# Patient Record
Sex: Male | Born: 1960
Health system: Southern US, Community
[De-identification: ages and names within clinical notes are randomized; demographics above are authoritative.]

## PROBLEM LIST (undated history)

## (undated) DIAGNOSIS — R569 Unspecified convulsions: Secondary | ICD-10-CM

## (undated) DIAGNOSIS — E119 Type 2 diabetes mellitus without complications: Secondary | ICD-10-CM

## (undated) DIAGNOSIS — K2270 Barrett's esophagus without dysplasia: Secondary | ICD-10-CM

## (undated) DIAGNOSIS — F419 Anxiety disorder, unspecified: Secondary | ICD-10-CM

## (undated) DIAGNOSIS — I639 Cerebral infarction, unspecified: Secondary | ICD-10-CM

## (undated) DIAGNOSIS — F319 Bipolar disorder, unspecified: Secondary | ICD-10-CM

## (undated) DIAGNOSIS — F329 Major depressive disorder, single episode, unspecified: Secondary | ICD-10-CM

## (undated) DIAGNOSIS — Q279 Congenital malformation of peripheral vascular system, unspecified: Secondary | ICD-10-CM

## (undated) DIAGNOSIS — I498 Other specified cardiac arrhythmias: Secondary | ICD-10-CM

## (undated) DIAGNOSIS — K219 Gastro-esophageal reflux disease without esophagitis: Secondary | ICD-10-CM

## (undated) DIAGNOSIS — G4730 Sleep apnea, unspecified: Secondary | ICD-10-CM

## (undated) DIAGNOSIS — N189 Chronic kidney disease, unspecified: Secondary | ICD-10-CM

## (undated) DIAGNOSIS — I10 Essential (primary) hypertension: Secondary | ICD-10-CM

## (undated) DIAGNOSIS — F32A Depression, unspecified: Secondary | ICD-10-CM

## (undated) DIAGNOSIS — D1800 Hemangioma unspecified site: Secondary | ICD-10-CM

## (undated) DIAGNOSIS — Z8619 Personal history of other infectious and parasitic diseases: Secondary | ICD-10-CM

## (undated) HISTORY — DX: Essential (primary) hypertension: I10

## (undated) HISTORY — DX: Type 2 diabetes mellitus without complications: E11.9

## (undated) HISTORY — DX: Anxiety disorder, unspecified: F41.9

## (undated) HISTORY — DX: Other specified cardiac arrhythmias: I49.8

## (undated) HISTORY — DX: Bipolar disorder, unspecified: F31.9

## (undated) HISTORY — DX: Personal history of other infectious and parasitic diseases: Z86.19

## (undated) HISTORY — DX: Major depressive disorder, single episode, unspecified: F32.9

## (undated) HISTORY — DX: Gastro-esophageal reflux disease without esophagitis: K21.9

## (undated) HISTORY — PX: OTHER SURGICAL HISTORY: SHX169

## (undated) HISTORY — DX: Depression, unspecified: F32.A

## (undated) HISTORY — DX: Chronic kidney disease, unspecified: N18.9

## (undated) HISTORY — DX: Barrett's esophagus without dysplasia: K22.70

## (undated) HISTORY — DX: Sleep apnea, unspecified: G47.30

---
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
## 1999-10-15 ENCOUNTER — Encounter: Payer: Self-pay | Admitting: Emergency Medicine

## 1999-10-15 ENCOUNTER — Emergency Department (HOSPITAL_COMMUNITY): Admission: EM | Admit: 1999-10-15 | Discharge: 1999-10-15 | Payer: Self-pay | Admitting: Emergency Medicine

## 2003-10-27 ENCOUNTER — Inpatient Hospital Stay (HOSPITAL_COMMUNITY): Admission: EM | Admit: 2003-10-27 | Discharge: 2003-10-28 | Payer: Self-pay | Admitting: Emergency Medicine

## 2003-11-12 ENCOUNTER — Ambulatory Visit (HOSPITAL_COMMUNITY): Admission: RE | Admit: 2003-11-12 | Discharge: 2003-11-12 | Payer: Self-pay | Admitting: Neurological Surgery

## 2006-05-09 ENCOUNTER — Emergency Department (HOSPITAL_COMMUNITY): Admission: EM | Admit: 2006-05-09 | Discharge: 2006-05-09 | Payer: Self-pay | Admitting: Emergency Medicine

## 2008-02-15 ENCOUNTER — Emergency Department (HOSPITAL_COMMUNITY): Admission: EM | Admit: 2008-02-15 | Discharge: 2008-02-16 | Payer: Self-pay | Admitting: Emergency Medicine

## 2008-02-16 ENCOUNTER — Ambulatory Visit: Payer: Self-pay | Admitting: *Deleted

## 2008-02-16 ENCOUNTER — Inpatient Hospital Stay (HOSPITAL_COMMUNITY): Admission: EM | Admit: 2008-02-16 | Discharge: 2008-02-20 | Payer: Self-pay | Admitting: *Deleted

## 2008-02-21 ENCOUNTER — Encounter: Admission: RE | Admit: 2008-02-21 | Discharge: 2008-02-21 | Payer: Self-pay | Admitting: Internal Medicine

## 2010-04-19 LAB — CBC
HCT: 47.6 % (ref 39.0–52.0)
Hemoglobin: 16.1 g/dL (ref 13.0–17.0)
MCHC: 33.9 g/dL (ref 30.0–36.0)
MCV: 89.9 fL (ref 78.0–100.0)
Platelets: 167 10*3/uL (ref 150–400)
RBC: 5.29 MIL/uL (ref 4.22–5.81)
RDW: 12.8 % (ref 11.5–15.5)
WBC: 9.4 10*3/uL (ref 4.0–10.5)

## 2010-04-19 LAB — BASIC METABOLIC PANEL
BUN: 14 mg/dL (ref 6–23)
CO2: 28 mEq/L (ref 19–32)
Calcium: 9.5 mg/dL (ref 8.4–10.5)
Chloride: 103 mEq/L (ref 96–112)
Creatinine, Ser: 0.77 mg/dL (ref 0.4–1.5)
GFR calc Af Amer: >60 mL/min (ref >60)
GFR calc non Af Amer: >60 mL/min (ref >60)
Glucose, Bld: 102 mg/dL — ABNORMAL HIGH (ref 70–99)
Potassium: 3.5 mEq/L (ref 3.5–5.1)
Sodium: 140 mEq/L (ref 135–145)

## 2010-04-19 LAB — DIFFERENTIAL
Basophils Absolute: 0 10*3/uL (ref 0.0–0.1)
Basophils Relative: 0 % (ref 0–1)
Eosinophils Absolute: 0.3 10*3/uL (ref 0.0–0.7)
Eosinophils Relative: 3 % (ref 0–5)
Lymphocytes Relative: 19 % (ref 12–46)
Lymphs Abs: 1.8 10*3/uL (ref 0.7–4.0)
Monocytes Absolute: 0.6 10*3/uL (ref 0.1–1.0)
Monocytes Relative: 6 % (ref 3–12)
Neutro Abs: 6.6 10*3/uL (ref 1.7–7.7)
Neutrophils Relative %: 71 % (ref 43–77)

## 2010-04-19 LAB — CARBOXYHEMOGLOBIN
Carboxyhemoglobin: 1.2 % (ref 0.5–1.5)
Methemoglobin: 1.7 % — ABNORMAL HIGH (ref 0.0–1.5)
O2 Saturation: 95.5 %
Total hemoglobin: 15.8 g/dL (ref 13.5–18.0)
Total oxygen content: 20.6 mL/dL (ref 15.0–23.0)

## 2010-04-19 LAB — RAPID URINE DRUG SCREEN, HOSP PERFORMED
Amphetamines: NOT DETECTED
Barbiturates: NOT DETECTED
Benzodiazepines: POSITIVE — AB
Cocaine: NOT DETECTED
Opiates: NOT DETECTED
Tetrahydrocannabinol: NOT DETECTED

## 2010-04-19 LAB — ETHANOL: Alcohol, Ethyl (B): 10 mg/dL (ref 0–10)

## 2010-04-19 LAB — SALICYLATE LEVEL: Salicylate Lvl: <4 mg/dL (ref 2.8–20.0)

## 2010-04-19 LAB — ACETAMINOPHEN LEVEL: Acetaminophen (Tylenol), Serum: <10 ug/mL — ABNORMAL LOW (ref 10–30)
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
## 2010-05-20 NOTE — Consult Note (Signed)
Todd Harris, Todd Harris                  ACCOUNT NO.:  [id number]   MEDICAL RECORD NO.:  [id number]          PATIENT TYPE:  EMS   LOCATION:  MINO                         FACILITY:  [facility]   PHYSICIAN:  Madelynn Done, MD  DATE OF BIRTH:  January 01, 1961   DATE OF CONSULTATION:  05/09/2006  DATE OF DISCHARGE:  05/09/2006                                 CONSULTATION   REASON FOR CONSULTATION:  Right middle finger pain and swelling.   REQUESTING PHYSICIAN:  Dr. Radford Pax, Emergency Department.   HISTORY:  Todd Harris is a 50 year old right hand dominant gentleman who  on Saturday morning, May 05, 2006, was feeding a dog biscuit to his blind  dog and unfortunately sustained a puncture wound along the ulnar aspect  of his middle finger at the tip.  The patient provided some local wound  care but noticed some increased pain and swelling.  He went to the  urgent care facility yesterday.  He was given some Rocephin and placed  on p.o. antibiotics and returned today.  The urgent care sent him to the  emergency department for concern about the swelling and the increased  swelling in his finger.  The patient was seen and evaluated in the  emergency department, and I was consulted for the evaluation of his  middle finger.  He denies any systemic signs of illnesses.  No fevers,  chills, nausea, vomiting.  No constitutional symptoms.   PAST MEDICAL HISTORY:  Depression.   PAST SURGICAL HISTORY:  Tonsillectomy, appendectomy, and what sounds  like a lipoma excision from the left shoulder region.   MEDICATIONS:  Lexapro.   ALLERGIES:  NONE.   SOCIAL HISTORY:  Socially, he is a nonsmoker.  He works in [employer] investment [employer].  He lives in [place].  His last tetanus shot was given in  the urgent care within the last 5 days.   REVIEW OF SYSTEMS:  No recent illnesses, hospitalizations.   EXAMINATION:  GENERAL:  He is a healthy-appearing 50 year old white male  in no acute distress.  He is alert and  oriented to person, place, and  time.  He has normal hand coordination.  Normal mood.  VITAL SIGNS:  He has a temperature of 97.8 oral, blood pressure 136/93,  heart rate of 75, and respirations of 20.  EXTREMITIES:  On examination of the right upper extremity, he has no  ascending erythema or evidence of lymphangitis.  He does have some mild  swelling over the middle finger from the MP crease out distally.  There  is some mild swelling in the digit, but it does not appear to be a  sausage-appearing digit.  He does not hold the finger in a flexed  posture.  He does not have pain with gentle passive motion with flexion  and extension.  I am able to bring his finger all the way down to the  distal palmar crease passively.  He has a small puncture wound along the  dorsal ulnar aspect of the middle finger at the distal pulp region.  His  FDS and FDP  are in continuity with the digit.  His sensation to light  touch is present.  He has good cap refill and good skin turgor.  He has  otherwise a full composite grip, normal muscle tone, and good strength  in the hand.  He has minimal tenderness over the flexor tendon sheath,  slight discomfort between the A2 and A4 region but not in the palm or  the distal tip.   LABORATORY STUDIES:  He has a white count of 7.7.   IMPRESSION:  Right middle finger dog bite with swelling and cellulitis.   PLAN:  Today, I reviewed the treatment course with the patient.  I do  not think he has a purulent tenosynovitis.  We talked about  immobilization and continuing the p.o. antibiotics.  The patient has  agreed with this plan.  He was given my office telephone number and told  to follow up in the office in the morning.  He was also given my cell  phone number if he has any worsening problems, fevers, chills, worsening  pain in that finger.  I plan to see him back in the morning to recheck  the finger.  He was instructed to be NPO.  If it does worsen, he may  need  decompression of the tendon sheath, if he does have more swelling,  more pain, and worsening redness.  The patient voiced understanding of  the plan.  The patient's questions were answered tonight.  He will  continue on the doxycycline and the clindamycin since they have already  been prescribed.  The patient's questions were answered today, and we  will see him back in followup.      Madelynn Done, MD  Electronically Signed     FWO/MEDQ  D:  05/09/2006  T:  05/09/2006  Job:  [id number]
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
## 2011-06-27 ENCOUNTER — Encounter: Payer: Self-pay | Admitting: Gastroenterology

## 2011-08-14 ENCOUNTER — Ambulatory Visit (AMBULATORY_SURGERY_CENTER): Payer: 59

## 2011-08-14 VITALS — Ht 70.0 in | Wt 176.0 lb

## 2011-08-14 DIAGNOSIS — Z1211 Encounter for screening for malignant neoplasm of colon: Secondary | ICD-10-CM

## 2011-08-14 MED ORDER — MOVIPREP 100 G PO SOLR: 1.0000 | Freq: Once | ORAL | Status: DC

## 2011-08-15 ENCOUNTER — Encounter: Payer: Self-pay | Admitting: Internal Medicine

## 2011-08-18 ENCOUNTER — Encounter: Payer: Self-pay | Admitting: Internal Medicine

## 2011-08-18 ENCOUNTER — Ambulatory Visit (AMBULATORY_SURGERY_CENTER): Payer: 59 | Admitting: Internal Medicine

## 2011-08-18 VITALS — BP 136/97 | HR 68 | Temp 97.9°F | Resp 16 | Ht 70.0 in | Wt 176.0 lb

## 2011-08-18 DIAGNOSIS — K5730 Diverticulosis of large intestine without perforation or abscess without bleeding: Secondary | ICD-10-CM

## 2011-08-18 DIAGNOSIS — Z1211 Encounter for screening for malignant neoplasm of colon: Secondary | ICD-10-CM

## 2011-08-18 MED ORDER — SODIUM CHLORIDE 0.9 % IV SOLN: 500.0000 mL | INTRAVENOUS | Status: DC

## 2011-08-18 NOTE — Progress Notes (Signed)
Patient did not have preoperative order for IV antibiotic SSI prophylaxis. (G8918)   

## 2011-08-18 NOTE — Patient Instructions (Addendum)
YOU HAD AN ENDOSCOPIC PROCEDURE TODAY AT THE [facility]: Refer to the procedure report that was given to you for any specific questions about what was found during the examination.  If the procedure report does not answer your questions, please call your gastroenterologist to clarify.  If you requested that your care partner not be given the details of your procedure findings, then the procedure report has been included in a sealed envelope for you to review at your convenience later.  YOU SHOULD EXPECT: Some feelings of bloating in the abdomen. Passage of more gas than usual.  Walking can help get rid of the air that was put into your GI tract during the procedure and reduce the bloating. If you had a lower endoscopy (such as a colonoscopy or flexible sigmoidoscopy) you may notice spotting of blood in your stool or on the toilet paper. If you underwent a bowel prep for your procedure, then you may not have a normal bowel movement for a few days.  DIET: Your first meal following the procedure should be a light meal and then it is ok to progress to your normal diet.  A half-sandwich or bowl of soup is an example of a good first meal.  Heavy or fried foods are harder to digest and may make you feel nauseous or bloated.  Likewise meals heavy in dairy and vegetables can cause extra gas to form and this can also increase the bloating.  Drink plenty of fluids but you should avoid alcoholic beverages for 24 hours.  ACTIVITY: Your care partner should take you home directly after the procedure.  You should plan to take it easy, moving slowly for the rest of the day.  You can resume normal activity the day after the procedure however you should NOT DRIVE or use heavy machinery for 24 hours (because of the sedation medicines used during the test).    SYMPTOMS TO REPORT IMMEDIATELY: A gastroenterologist can be reached at any hour.  During normal business hours, 8:30 AM to 5:00 PM Monday through Friday,  call [phone].  After hours and on weekends, please call the GI answering service at [phone] who will take a message and have the physician on call contact you.   Following lower endoscopy (colonoscopy or flexible sigmoidoscopy):  Excessive amounts of blood in the stool  Significant tenderness or worsening of abdominal pains  Swelling of the abdomen that is new, acute  Fever of 100F or higher    FOLLOW UP: If any biopsies were taken you will be contacted by phone or by letter within the next 1-3 weeks.  Call your gastroenterologist if you have not heard about the biopsies in 3 weeks.  Our staff will call the home number listed on your records the next business day following your procedure to check on you and address any questions or concerns that you may have at that time regarding the information given to you following your procedure. This is a courtesy call and so if there is no answer at the home number and we have not heard from you through the emergency physician on call, we will assume that you have returned to your regular daily activities without incident.  SIGNATURES/CONFIDENTIALITY: You and/or your care partner have signed paperwork which will be entered into your electronic medical record.  These signatures attest to the fact that that the information above on your After Visit Summary has been reviewed and is understood.  Full responsibility of the confidentiality   of this discharge information lies with you and/or your care-partner.     

## 2011-08-18 NOTE — Progress Notes (Signed)
Patient did not experience any of the following events: a burn prior to discharge; a fall within the facility; wrong site/side/patient/procedure/implant event; or a hospital transfer or hospital admission upon discharge from the facility. (G8907) Patient did not have preoperative order for IV antibiotic SSI prophylaxis. (G8918)  

## 2011-08-18 NOTE — Op Note (Signed)
[facility] [address] [place], [place]  [address]  COLONOSCOPY PROCEDURE REPORT  PATIENT:  Todd Harris, Todd Harris  MR#:  [id number] BIRTHDATE:  06-Aug-1960, 51 yrs. old  GENDER:  male ENDOSCOPIST:  Wilhemina Bonito. Eda Keys, MD REF. BY:  Herb Grays, M.D. PROCEDURE DATE:  08/18/2011 PROCEDURE:  Average-risk screening colonoscopy G0121 ASA CLASS:  Class II INDICATIONS:  Routine Risk Screening MEDICATIONS:   MAC sedation, administered by CRNA, propofol (Diprivan) 270 mg IV  DESCRIPTION OF PROCEDURE:   After the risks benefits and alternatives of the procedure were thoroughly explained, informed consent was obtained.  Digital rectal exam was performed and revealed no abnormalities.   The LB CF-[id number] endoscope was introduced through the anus and advanced to the cecum, which was identified by both the appendix and ileocecal valve, without limitations.  The quality of the prep was good, using MoviPrep. The instrument was then slowly withdrawn as the colon was fully examined. <<PROCEDUREIMAGES>>  FINDINGS:  Moderate diverticulosis was found in the left colon. Several nonbleeding Arteriovenous malformations were seen in the in the cecum.  Otherwise normal colonoscopy without  polyps, masses, vascular ectasias, or inflammatory changes.   Retroflexed views in the rectum revealed internal hemorrhoids.    The time to cecum = 2:20  minutes. The scope was then withdrawn in  12  minutes from the cecum and the procedure completed.  COMPLICATIONS:  None  ENDOSCOPIC IMPRESSION: 1) Moderate diverticulosis in the left colon 2) AVM's in the cecum 3) Otherwise normal colonoscopy RECOMMENDATIONS: 1) Continue current colorectal screening recommendations for "routine risk" patients with a repeat colonoscopy in 10 years.  ______________________________ Wilhemina Bonito. Eda Keys, MD  CC:  Herb Grays, MD; The Patient  n. eSIGNED:   Wilhemina Bonito. Eda Keys at 08/18/2011 12:27 PM  Tora Perches, [id number]

## 2011-08-21 ENCOUNTER — Telehealth: Payer: Self-pay | Admitting: *Deleted

## 2011-08-21 NOTE — Telephone Encounter (Signed)
  Follow up Call-  Call back number 08/18/2011  Post procedure Call Back phone  # [phone] or [phone]  Permission to leave phone message Yes     Patient questions:  Do you have a fever, pain , or abdominal swelling? no Pain Score  0 *  Have you tolerated food without any problems? yes  Have you been able to return to your normal activities? yes  Do you have any questions about your discharge instructions: Diet   no Medications  no Follow up visit  no  Do you have questions or concerns about your Care? no  Actions: * If pain score is 4 or above: No action needed, pain <4.

## 2013-10-10 ENCOUNTER — Emergency Department (HOSPITAL_COMMUNITY): Payer: 59

## 2013-10-10 ENCOUNTER — Encounter (HOSPITAL_COMMUNITY): Payer: Self-pay | Admitting: Emergency Medicine

## 2013-10-10 ENCOUNTER — Emergency Department (HOSPITAL_COMMUNITY)
Admission: EM | Admit: 2013-10-10 | Discharge: 2013-10-10 | Disposition: A | Discharge status: Home / Self Care | Payer: 59 | Attending: Emergency Medicine | Admitting: Emergency Medicine

## 2013-10-10 DIAGNOSIS — R0789 Other chest pain: Secondary | ICD-10-CM | POA: Insufficient documentation

## 2013-10-10 DIAGNOSIS — S299XXA Unspecified injury of thorax, initial encounter: Secondary | ICD-10-CM | POA: Diagnosis not present

## 2013-10-10 DIAGNOSIS — Y9289 Other specified places as the place of occurrence of the external cause: Secondary | ICD-10-CM | POA: Diagnosis not present

## 2013-10-10 DIAGNOSIS — S50812A Abrasion of left forearm, initial encounter: Secondary | ICD-10-CM | POA: Insufficient documentation

## 2013-10-10 DIAGNOSIS — F319 Bipolar disorder, unspecified: Secondary | ICD-10-CM | POA: Insufficient documentation

## 2013-10-10 DIAGNOSIS — S51012A Laceration without foreign body of left elbow, initial encounter: Secondary | ICD-10-CM | POA: Diagnosis not present

## 2013-10-10 DIAGNOSIS — S199XXA Unspecified injury of neck, initial encounter: Secondary | ICD-10-CM | POA: Insufficient documentation

## 2013-10-10 DIAGNOSIS — S0101XA Laceration without foreign body of scalp, initial encounter: Secondary | ICD-10-CM | POA: Insufficient documentation

## 2013-10-10 DIAGNOSIS — Y9389 Activity, other specified: Secondary | ICD-10-CM | POA: Insufficient documentation

## 2013-10-10 DIAGNOSIS — S0990XA Unspecified injury of head, initial encounter: Secondary | ICD-10-CM | POA: Diagnosis present

## 2013-10-10 DIAGNOSIS — S50312A Abrasion of left elbow, initial encounter: Secondary | ICD-10-CM | POA: Insufficient documentation

## 2013-10-10 DIAGNOSIS — T07XXXA Unspecified multiple injuries, initial encounter: Secondary | ICD-10-CM

## 2013-10-10 DIAGNOSIS — W11XXXA Fall on and from ladder, initial encounter: Secondary | ICD-10-CM | POA: Insufficient documentation

## 2013-10-10 DIAGNOSIS — S3991XA Unspecified injury of abdomen, initial encounter: Secondary | ICD-10-CM | POA: Diagnosis not present

## 2013-10-10 DIAGNOSIS — Z79899 Other long term (current) drug therapy: Secondary | ICD-10-CM | POA: Insufficient documentation

## 2013-10-10 DIAGNOSIS — W208XXA Other cause of strike by thrown, projected or falling object, initial encounter: Secondary | ICD-10-CM | POA: Insufficient documentation

## 2013-10-10 MED ORDER — ONDANSETRON HCL 4 MG/2ML IJ SOLN
4.0000 mg | Freq: Once | INTRAMUSCULAR | Status: AC
  Administered 2013-10-10: 4 mg via INTRAVENOUS
  Filled 2013-10-10: qty 2

## 2013-10-10 MED ORDER — OXYCODONE-ACETAMINOPHEN 5-325 MG PO TABS: 1.0000 | ORAL_TABLET | Freq: Three times a day (TID) | ORAL | Status: DC | PRN

## 2013-10-10 MED ORDER — SODIUM CHLORIDE 0.9 % IV BOLUS (SEPSIS)
1000.0000 mL | Freq: Once | INTRAVENOUS | Status: AC
  Administered 2013-10-10: 1000 mL via INTRAVENOUS

## 2013-10-10 MED ORDER — LIDOCAINE-EPINEPHRINE (PF) 2 %-1:200000 IJ SOLN
20.0000 mL | Freq: Once | INTRAMUSCULAR | Status: AC
  Administered 2013-10-10: 20 mL via INTRADERMAL
  Filled 2013-10-10: qty 20

## 2013-10-10 MED ORDER — IOHEXOL 300 MG/ML  SOLN
100.0000 mL | Freq: Once | INTRAMUSCULAR | Status: AC | PRN
  Administered 2013-10-10: 100 mL via INTRAVENOUS

## 2013-10-10 MED ORDER — MORPHINE SULFATE 4 MG/ML IJ SOLN
6.0000 mg | Freq: Once | INTRAMUSCULAR | Status: AC
  Administered 2013-10-10: 6 mg via INTRAVENOUS
  Filled 2013-10-10: qty 2

## 2013-10-10 MED ORDER — METHOCARBAMOL 500 MG PO TABS: 500.0000 mg | ORAL_TABLET | Freq: Two times a day (BID) | ORAL | Status: DC

## 2013-10-10 MED ORDER — OXYCODONE-ACETAMINOPHEN 5-325 MG PO TABS
1.0000 | ORAL_TABLET | Freq: Once | ORAL | Status: AC
  Administered 2013-10-10: 1 via ORAL
  Filled 2013-10-10: qty 1

## 2013-10-10 NOTE — ED Notes (Signed)
Was up 3 rungs on ladder cutting pine tree w/ long saw and cut limb sung out striking pt and making him faLL STRAIGHT BACK HITTING HEAD ON CONCRETE PT WAS B y himself states no loc but got the wind knocked out of him has ~ [facility] LAC TO BACK OF HEAD W/ bandGE  And abrasion to left elbow and abrsions to  Rt forearm denies neck of baCK BUT C/O OF ABD NAUSEA AND RIB PAIN HEAD PAIN has20 IV left forarm

## 2013-10-10 NOTE — ED Provider Notes (Signed)
Medical screening examination/treatment/procedure(s) were performed by non-physician practitioner and as supervising physician I was immediately available for consultation/collaboration.   EKG Interpretation None      Rolland Porter, MD, Abram Sander   Janice Norrie, MD 10/10/13 [phone]

## 2013-10-10 NOTE — Discharge Instructions (Signed)
You have been evaluated for your recent fall. You suffered a minor head injury.  Follow instruction below for care of your wound.  Have your staples and sutures removed in 7 days by your doctor. Take pain medication and muscle relaxant as needed.  Allow 24 hrs before washing wound with gentle soap and water.  If you notice signs of infection, return for reevaluation.  Head Injury You have a head injury. Headaches and throwing up (vomiting) are common after a head injury. It should be easy to wake up from sleeping. Sometimes you must stay in the hospital. Most problems happen within the first 24 hours. Side effects may occur up to 7-10 days after the injury.  WHAT ARE THE TYPES OF HEAD INJURIES? Head injuries can be as minor as a bump. Some head injuries can be more severe. More severe head injuries include:  A jarring injury to the brain (concussion).  A bruise of the brain (contusion). This mean there is bleeding in the brain that can cause swelling.  A cracked skull (skull fracture).  Bleeding in the brain that collects, clots, and forms a bump (hematoma). WHEN SHOULD I GET HELP RIGHT AWAY?   You are confused or sleepy.  You cannot be woken up.  You feel sick to your stomach (nauseous) or keep throwing up (vomiting).  Your dizziness or unsteadiness is getting worse.  You have very bad, lasting headaches that are not helped by medicine. Take medicines only as told by your doctor.  You cannot use your arms or legs like normal.  You cannot walk.  You notice changes in the black spots in the center of the colored part of your eye (pupil).  You have clear or bloody fluid coming from your nose or ears.  You have trouble seeing. During the next 24 hours after the injury, you must stay with someone who can watch you. This person should get help right away (call 911 in the [place] if you start to shake and are not able to control it (have seizures), you pass out, or you are unable to wake  up. HOW CAN I PREVENT A HEAD INJURY IN THE FUTURE?  Wear seat belts.  Wear a helmet while bike riding and playing sports like football.  Stay away from dangerous activities around the house. WHEN CAN I RETURN TO NORMAL ACTIVITIES AND ATHLETICS? See your doctor before doing these activities. You should not do normal activities or play contact sports until 1 week after the following symptoms have stopped:  Headache that does not go away.  Dizziness.  Poor attention.  Confusion.  Memory problems.  Sickness to your stomach or throwing up.  Tiredness.  Fussiness.  Bothered by bright lights or loud noises.  Anxiousness or depression.  Restless sleep. MAKE SURE YOU:   Understand these instructions.  Will watch your condition.  Will get help right away if you are not doing well or get worse. Document Released: 12/02/2007 Document Revised: 05/05/2013 Document Reviewed: 08/26/2012 [facility] Patient Information 2015 [place], [place]. This information is not intended to replace advice given to you by your health care provider. Make sure you discuss any questions you have with your health care provider.  Laceration Care, Adult A laceration is a cut that goes through all layers of the skin. The cut goes into the tissue beneath the skin. HOME CARE For stitches (sutures) or staples:  Keep the cut clean and dry.  If you have a bandage (dressing), change it at least once  a day. Change the bandage if it gets wet or dirty, or as told by your doctor.  Wash the cut with soap and water 2 times a day. Rinse the cut with water. Pat it dry with a clean towel.  Put a thin layer of medicated cream on the cut as told by your doctor.  You may shower after the first 24 hours. Do not soak the cut in water until the stitches are removed.  Only take medicines as told by your doctor.  Have your stitches or staples removed as told by your doctor. For skin adhesive strips:  Keep the cut clean  and dry.  Do not get the strips wet. You may take a bath, but be careful to keep the cut dry.  If the cut gets wet, pat it dry with a clean towel.  The strips will fall off on their own. Do not remove the strips that are still stuck to the cut. For wound glue:  You may shower or take baths. Do not soak or scrub the cut. Do not swim. Avoid heavy sweating until the glue falls off on its own. After a shower or bath, pat the cut dry with a clean towel.  Do not put medicine on your cut until the glue falls off.  If you have a bandage, do not put tape over the glue.  Avoid lots of sunlight or tanning lamps until the glue falls off. Put sunscreen on the cut for the first year to reduce your scar.  The glue will fall off on its own. Do not pick at the glue. You may need a tetanus shot if:  You cannot remember when you had your last tetanus shot.  You have never had a tetanus shot. If you need a tetanus shot and you choose not to have one, you may get tetanus. Sickness from tetanus can be serious. GET HELP RIGHT AWAY IF:   Your pain does not get better with medicine.  Your arm, hand, leg, or foot loses feeling (numbness) or changes color.  Your cut is bleeding.  Your joint feels weak, or you cannot use your joint.  You have painful lumps on your body.  Your cut is red, puffy (swollen), or painful.  You have a red line on the skin near the cut.  You have yellowish-white fluid (pus) coming from the cut.  You have a fever.  You have a bad smell coming from the cut or bandage.  Your cut breaks open before or after stitches are removed.  You notice something coming out of the cut, such as wood or glass.  You cannot move a finger or toe. MAKE SURE YOU:   Understand these instructions.  Will watch your condition.  Will get help right away if you are not doing well or get worse. Document Released: 06/07/2007 Document Revised: 03/13/2011 Document Reviewed:  06/14/2010 [facility] Patient Information 2015 [place], [place]. This information is not intended to replace advice given to you by your health care provider. Make sure you discuss any questions you have with your health care provider.

## 2013-10-10 NOTE — ED Notes (Signed)
Rolled gauze and bacitracin placed on both wounds. Peripheral pulses and cap refill intact on left hand. Pt reported no pain or numbness from either dressing.

## 2013-10-10 NOTE — ED Provider Notes (Signed)
CSN: [id number]     Arrival date & time 10/10/13  1100 History   First MD Initiated Contact with Patient 10/10/13 1143     Chief Complaint  Patient presents with  . Fall  . Head Injury     (Consider location/radiation/quality/duration/timing/severity/associated sxs/prior Treatment) HPI  53 year old male with history of bipolar presents to the ER for evaluation of a recent fall.  Pt was standing on the 3rd rung of ladder cutting pine tree with a long saw approximately 3 hrs ago.  A tree limp fell while pt was cutting, striking pt causing him to fall backward off ladder hitting head on concrete.  Pt denies LOC but sts "i got the wind knock out of me".  He was able to reach for his phone and call his wife. He complained of moderate pain to the back of his head, neck, mid back, and left elbow. He was unable to get up after the fall. He felt nauseous and febrile pain without any significant abdominal pain. He is up-to-date with tetanus. No specific treatment tried. Last meal was this morning, small bowl of cereal. Denies any recent alcohol consumption. Denies any new numbness. Patient is not on any blood thinner medication.  Past Medical History  Diagnosis Date  . Bipolar 1 disorder    Past Surgical History  Procedure Laterality Date  . Appendectomy      age 37  . Tonsillectomy    . Bone spur      age 24  . Kidney stones     Family History  Problem Relation Age of Onset  . Colon cancer Neg Hx    History  Substance Use Topics  . Smoking status: Never Smoker   . Smokeless tobacco: Never Used  . Alcohol Use: Not on file    Review of Systems  All other systems reviewed and are negative.     Allergies  Review of patient's allergies indicates no known allergies.  Home Medications   Prior to Admission medications   Medication Sig Start Date End Date Taking? Authorizing Provider  divalproex (DEPAKOTE ER) 500 MG 24 hr tablet Take 3,000 mg by mouth daily.   Yes Historical  Provider, MD  lamoTRIgine (LAMICTAL) 200 MG tablet Take 200 mg by mouth at bedtime.   Yes Historical Provider, MD  Testosterone (AXIRON) 30 MG/ACT SOLN Place 1 application onto the skin daily.    Yes Historical Provider, MD   BP 149/101  Pulse 97  Temp(Src) 99.7 F (37.6 C) (Oral)  Resp 16  SpO2 100% Physical Exam  Constitutional: He is oriented to person, place, and time. He appears well-developed and well-nourished. No distress.  Patient presents with c-collar and long spineboard.  HENT:  Head: Atraumatic.  No hemotympanum, no septal hematoma, no malocclusion, no midface tenderness   2 cm laceration noted to occipital scalp  Eyes: Conjunctivae and EOM are normal. Pupils are equal, round, and reactive to light.  Neck: Neck supple.  C-collar in place.  Cardiovascular: Normal rate and regular rhythm.   Pulmonary/Chest: Effort normal and breath sounds normal. He exhibits tenderness (Mild generalized chest wall tenderness without crepitus or emphysema).  Abdominal: Soft. There is tenderness (mild generalized abdominal tenderness without focal point tenderness, guarding, or rebound tenderness).  Musculoskeletal: He exhibits tenderness (Tenderness to mid thoracic spine and lower lumbar spine without crepitus or step-off.).  Neurological: He is alert and oriented to person, place, and time. He has normal strength. No cranial nerve deficit or sensory deficit. GCS eye  subscore is 4. GCS verbal subscore is 5. GCS motor subscore is 6.  Skin: No rash noted.  Multiple abrasion noted to right forearm volarly. Abrasion noted to left posterior elbow 1cm laceration. L elbow with FROM, no crepitus.    Psychiatric: He has a normal mood and affect.    ED Course  Procedures (including critical care time)  Patient with a mechanical fall, falling off 3 rungs of ladder.  X-rays CT scan ordered. Pain medication given. Care discussed with Dr. Tomi Bamberger.  2:56 PM Head, C-spine, chest, abdominal, and pelvis CT  scan without any obvious acute injury. X-ray of left elbow without fracture or dislocation. Patient has laceration to occipital scalp and small laceration to left elbow which was sutured by me. Patient discharge with pain medication, muscle relaxant, and followup with PCP for further care. Patient mentating appropriately.  LACERATION REPAIR Performed by: Domenic Moras Authorized byDomenic Moras Consent: Verbal consent obtained. Risks and benefits: risks, benefits and alternatives were discussed Consent given by: patient Patient identity confirmed: provided demographic data Prepped and Draped in normal sterile fashion Wound explored  Laceration Location: occipital scalp  Laceration Length: 3cm  No Foreign Bodies seen or palpated  Anesthesia: local infiltration  Local anesthetic: lidocaine 2% w epinephrine  Anesthetic total: 4 ml  Irrigation method: syringe Amount of cleaning: standard  Skin closure: surgical staple  Number of sutures: 4  Technique: surgical staple  Patient tolerance: Patient tolerated the procedure well with no immediate complications.  LACERATION REPAIR Performed by: Domenic Moras Authorized byDomenic Moras Consent: Verbal consent obtained. Risks and benefits: risks, benefits and alternatives were discussed Consent given by: patient Patient identity confirmed: provided demographic data Prepped and Draped in normal sterile fashion Wound explored  Laceration Location: L elbow  Laceration Length: 1cm  No Foreign Bodies seen or palpated  Anesthesia: local infiltration  Local anesthetic: lidocaine 2% w epinephrine  Anesthetic total: 2 ml  Irrigation method: syringe Amount of cleaning: standard  Skin closure: prolene 5.0  Number of sutures: 1  Technique: simple interrupted  Patient tolerance: Patient tolerated the procedure well with no immediate complications.    Labs Review Labs Reviewed - No data to display  Imaging Review Dg Elbow  Complete Left  10/10/2013   CLINICAL DATA:  [place] approximately 3 feet off of a ladder earlier today, injuring the left elbow. Initial encounter.  EXAM: LEFT ELBOW - COMPLETE 3+ VIEW  COMPARISON:  None.  FINDINGS: No evidence of acute displaced fracture or dislocation. Well-preserved joint spaces. No intrinsic osseous abnormalities. No posterior fat pad to confirm joint effusion or hemarthrosis. Calcification in the subcutaneous fat of the medial upper forearm is likely a phlebolith. Calcifications are noted at the insertion of the triceps tendon on the olecranon.  IMPRESSION: No acute osseous abnormality suspected. Calcifications at the insertion of the triceps tendon on the olecranon are felt to be dystrophic rather than an an acute avulsion fracture. Please correlate with point tenderness.   Electronically Signed   By: Evangeline Dakin M.D.   On: 10/10/2013 14:12   Ct Head Wo Contrast  10/10/2013   CLINICAL DATA:  Struck by a branch well on a ladder cutting branches, fell from ladder onto concrete approximately 6 feet striking head, posterior laceration  EXAM: CT HEAD WITHOUT CONTRAST  CT CERVICAL SPINE WITHOUT CONTRAST  TECHNIQUE: Multidetector CT imaging of the head and cervical spine was performed following the standard protocol without intravenous contrast. Multiplanar CT image reconstructions of the cervical spine were also generated.  COMPARISON:  10/27/2003 CT head and cervical spine, 10/27/2003 MRI brain  FINDINGS: CT HEAD FINDINGS  Normal ventricular morphology.  No midline shift or mass effect.  Multiple parenchymal calcifications are again identified, unchanged.  Prior MR suggest these are multiple cavernous angiomas.  No intracranial hemorrhage, mass lesion, or evidence acute infarction.  Vasogenic edema identified at the posterior RIGHT temporal lobe on the previous exam resolved.  No extra-axial fluid collections.  Scalp soft tissue swelling posteriorly.  Bones demineralized but intact.   Visualized paranasal sinuses and mastoid air cells clear.  CT CERVICAL SPINE FINDINGS  Prevertebral soft tissues normal thickness.  Disc space narrowing with endplate spur formation at C5-C6.  Vertebral body and disc space heights otherwise maintained.  Sent alignments normal.  No acute fracture, subluxation, or bone destruction.  Visualized skullbase intact.  Tips of lung apices clear.  IMPRESSION: No acute intracranial abnormalities.  Parenchymal calcifications at multiple sites corresponding to calcifications within cavernous angiomas on prior MR exams.  No acute cervical spine abnormalities.  Degenerative disc disease changes at C5-C6.   Electronically Signed   By: Lavonia Dana M.D.   On: 10/10/2013 13:56   Ct Chest W Contrast  10/10/2013   CLINICAL DATA:  Initial encounter for 6 foot fall from ladder while trimming tree. Struck head on concrete with lacerations to back of head. Trauma to both on this. Abdominal and rib pain. Head pain and nausea.  EXAM: CT CHEST, ABDOMEN, AND PELVIS WITH CONTRAST  TECHNIQUE: Multidetector CT imaging of the chest, abdomen and pelvis was performed following the standard protocol during bolus administration of intravenous contrast.  CONTRAST:  141mL OMNIPAQUE IOHEXOL 300 MG/ML  SOLN  COMPARISON:  10/27/2003.  FINDINGS: CT CHEST FINDINGS  Soft tissue / Mediastinum: There is no axillary lymphadenopathy. No mediastinal or hilar lymphadenopathy. Thoracic esophagus is normal. Aorta has normal CT imaging features without wall thickening or irregularity. No dissection of the thoracic aorta. The heart size is normal. Coronary artery calcification is noted. No pericardial effusion.  Lungs / Pleura: No pneumothorax or pleural effusion. Dependent atelectasis noted in the lung bases bilaterally. No focal airspace consolidation. No pulmonary edema.  Bones: Bone windows reveal no worrisome lytic or sclerotic osseous lesions.  CT ABDOMEN AND PELVIS FINDINGS  Hepatobiliary: No focal abnormality  within the liver parenchyma. There is no evidence for gallstones, gallbladder wall thickening, or pericholecystic fluid. No intrahepatic or extrahepatic biliary dilation.  Pancreas: No focal mass lesion. No dilatation of the main duct. No intraparenchymal cyst. No peripancreatic edema.  Spleen: No splenomegaly. No focal mass lesion.  Adrenals/Urinary Tract: No adrenal nodule or mass. Kidneys have normal imaging features bilaterally no ureteral abnormality. Urinary bladder is normal.  Stomach/Bowel: Stomach is nondistended. No gastric wall thickening. No evidence of outlet obstruction. Duodenum is normally positioned as is the ligament of Treitz. No small bowel wall thickening. No small bowel dilatation. Terminal ileum is normal. The appendix is not visualized, but there is no edema or inflammation in the region of the cecum. Diverticular changes are seen diffusely in the colon without evidence for diverticulitis.  Vascular/Lymphatic: Atherosclerotic calcification is noted in the wall of the abdominal aorta without aneurysm. No gastrohepatic or hepatoduodenal ligament lymphadenopathy. The portal vein and superior mesenteric vein are patent. No retroperitoneal lymphadenopathy. No pelvic sidewall lymphadenopathy.  Reproductive: Prostate gland is unremarkable.  Other: Small bilateral inguinal hernias contain only fat. No evidence for ventral wall hernia. No intraperitoneal free fluid.  Musculoskeletal: 2.1 cm well-defined lucency in the left iliac bone,  adjacent to the SI joint has benign imaging features. No evidence for an acute fracture in the lumbar spine or bony pelvis.  IMPRESSION: No acute traumatic abnormalities pain in the chest, abdomen, or pelvis. No intraperitoneal free fluid.   Electronically Signed   By: Misty Stanley M.D.   On: 10/10/2013 14:00   Ct Cervical Spine Wo Contrast  10/10/2013   CLINICAL DATA:  Struck by a branch well on a ladder cutting branches, fell from ladder onto concrete approximately  6 feet striking head, posterior laceration  EXAM: CT HEAD WITHOUT CONTRAST  CT CERVICAL SPINE WITHOUT CONTRAST  TECHNIQUE: Multidetector CT imaging of the head and cervical spine was performed following the standard protocol without intravenous contrast. Multiplanar CT image reconstructions of the cervical spine were also generated.  COMPARISON:  10/27/2003 CT head and cervical spine, 10/27/2003 MRI brain  FINDINGS: CT HEAD FINDINGS  Normal ventricular morphology.  No midline shift or mass effect.  Multiple parenchymal calcifications are again identified, unchanged.  Prior MR suggest these are multiple cavernous angiomas.  No intracranial hemorrhage, mass lesion, or evidence acute infarction.  Vasogenic edema identified at the posterior RIGHT temporal lobe on the previous exam resolved.  No extra-axial fluid collections.  Scalp soft tissue swelling posteriorly.  Bones demineralized but intact.  Visualized paranasal sinuses and mastoid air cells clear.  CT CERVICAL SPINE FINDINGS  Prevertebral soft tissues normal thickness.  Disc space narrowing with endplate spur formation at C5-C6.  Vertebral body and disc space heights otherwise maintained.  Sent alignments normal.  No acute fracture, subluxation, or bone destruction.  Visualized skullbase intact.  Tips of lung apices clear.  IMPRESSION: No acute intracranial abnormalities.  Parenchymal calcifications at multiple sites corresponding to calcifications within cavernous angiomas on prior MR exams.  No acute cervical spine abnormalities.  Degenerative disc disease changes at C5-C6.   Electronically Signed   By: Lavonia Dana M.D.   On: 10/10/2013 13:56   Ct Abdomen Pelvis W Contrast  10/10/2013   CLINICAL DATA:  Initial encounter for 6 foot fall from ladder while trimming tree. Struck head on concrete with lacerations to back of head. Trauma to both on this. Abdominal and rib pain. Head pain and nausea.  EXAM: CT CHEST, ABDOMEN, AND PELVIS WITH CONTRAST  TECHNIQUE:  Multidetector CT imaging of the chest, abdomen and pelvis was performed following the standard protocol during bolus administration of intravenous contrast.  CONTRAST:  166mL OMNIPAQUE IOHEXOL 300 MG/ML  SOLN  COMPARISON:  10/27/2003.  FINDINGS: CT CHEST FINDINGS  Soft tissue / Mediastinum: There is no axillary lymphadenopathy. No mediastinal or hilar lymphadenopathy. Thoracic esophagus is normal. Aorta has normal CT imaging features without wall thickening or irregularity. No dissection of the thoracic aorta. The heart size is normal. Coronary artery calcification is noted. No pericardial effusion.  Lungs / Pleura: No pneumothorax or pleural effusion. Dependent atelectasis noted in the lung bases bilaterally. No focal airspace consolidation. No pulmonary edema.  Bones: Bone windows reveal no worrisome lytic or sclerotic osseous lesions.  CT ABDOMEN AND PELVIS FINDINGS  Hepatobiliary: No focal abnormality within the liver parenchyma. There is no evidence for gallstones, gallbladder wall thickening, or pericholecystic fluid. No intrahepatic or extrahepatic biliary dilation.  Pancreas: No focal mass lesion. No dilatation of the main duct. No intraparenchymal cyst. No peripancreatic edema.  Spleen: No splenomegaly. No focal mass lesion.  Adrenals/Urinary Tract: No adrenal nodule or mass. Kidneys have normal imaging features bilaterally no ureteral abnormality. Urinary bladder is normal.  Stomach/Bowel:  Stomach is nondistended. No gastric wall thickening. No evidence of outlet obstruction. Duodenum is normally positioned as is the ligament of Treitz. No small bowel wall thickening. No small bowel dilatation. Terminal ileum is normal. The appendix is not visualized, but there is no edema or inflammation in the region of the cecum. Diverticular changes are seen diffusely in the colon without evidence for diverticulitis.  Vascular/Lymphatic: Atherosclerotic calcification is noted in the wall of the abdominal aorta without  aneurysm. No gastrohepatic or hepatoduodenal ligament lymphadenopathy. The portal vein and superior mesenteric vein are patent. No retroperitoneal lymphadenopathy. No pelvic sidewall lymphadenopathy.  Reproductive: Prostate gland is unremarkable.  Other: Small bilateral inguinal hernias contain only fat. No evidence for ventral wall hernia. No intraperitoneal free fluid.  Musculoskeletal: 2.1 cm well-defined lucency in the left iliac bone, adjacent to the SI joint has benign imaging features. No evidence for an acute fracture in the lumbar spine or bony pelvis.  IMPRESSION: No acute traumatic abnormalities pain in the chest, abdomen, or pelvis. No intraperitoneal free fluid.   Electronically Signed   By: Misty Stanley M.D.   On: 10/10/2013 14:00     EKG Interpretation None      MDM   Final diagnoses:  Fall from ladder, initial encounter  Occipital scalp laceration, initial encounter  Elbow laceration, left, initial encounter  Abrasions of multiple sites    BP 132/91  Pulse 95  Temp(Src) 99.7 F (37.6 C) (Oral)  Resp 17  SpO2 99%  I have reviewed nursing notes and vital signs. I personally reviewed the imaging tests through PACS system  I reviewed available ER/hospitalization records thought the EMR     Domenic Moras, [place] 10/10/13 1555

## 2013-10-10 NOTE — ED Notes (Signed)
Pt in ct 

## 2014-06-26 ENCOUNTER — Encounter: Payer: Self-pay | Admitting: Internal Medicine

## 2014-11-03 DIAGNOSIS — I639 Cerebral infarction, unspecified: Secondary | ICD-10-CM

## 2014-11-03 HISTORY — DX: Cerebral infarction, unspecified: I63.9

## 2014-12-24 ENCOUNTER — Ambulatory Visit (INDEPENDENT_AMBULATORY_CARE_PROVIDER_SITE_OTHER): Payer: 59 | Admitting: Neurology

## 2014-12-24 ENCOUNTER — Encounter: Payer: Self-pay | Admitting: *Deleted

## 2014-12-24 ENCOUNTER — Encounter: Payer: Self-pay | Admitting: Neurology

## 2014-12-24 VITALS — BP 109/71 | HR 74 | Ht 70.0 in | Wt 203.0 lb

## 2014-12-24 DIAGNOSIS — D1802 Hemangioma of intracranial structures: Secondary | ICD-10-CM

## 2014-12-24 DIAGNOSIS — F3132 Bipolar disorder, current episode depressed, moderate: Secondary | ICD-10-CM

## 2014-12-24 DIAGNOSIS — I10 Essential (primary) hypertension: Secondary | ICD-10-CM

## 2014-12-24 NOTE — Patient Instructions (Signed)
Remember to drink plenty of fluid, eat healthy meals and do not skip any meals. Try to eat protein with a every meal and eat a healthy snack such as fruit or nuts in between meals. Try to keep a regular sleep-wake schedule and try to exercise daily, particularly in the form of walking, 20-30 minutes a day, if you can.   As far as your medications are concerned, I would like to suggest: Do not take aspirin or alleve or ibuprofen or anything that can increase bleeding risk  I would like to see you back as needed, sooner if we need to. Please call us with any interim questions, concerns, problems, updates or refill requests.   Our phone number is [phone]. We also have an after hours call service for urgent matters and there is a physician on-call for urgent questions. For any emergencies you know to call 911 or go to the nearest emergency room
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
## 2014-12-30 DIAGNOSIS — I10 Essential (primary) hypertension: Secondary | ICD-10-CM | POA: Insufficient documentation

## 2014-12-30 DIAGNOSIS — F319 Bipolar disorder, unspecified: Secondary | ICD-10-CM | POA: Insufficient documentation

## 2014-12-30 DIAGNOSIS — I152 Hypertension secondary to endocrine disorders: Secondary | ICD-10-CM | POA: Insufficient documentation

## 2014-12-30 DIAGNOSIS — D1802 Hemangioma of intracranial structures: Secondary | ICD-10-CM | POA: Insufficient documentation

## 2015-11-22 ENCOUNTER — Ambulatory Visit (INDEPENDENT_AMBULATORY_CARE_PROVIDER_SITE_OTHER): Payer: 59 | Admitting: Neurology

## 2015-11-22 ENCOUNTER — Encounter: Payer: Self-pay | Admitting: Neurology

## 2015-11-22 VITALS — BP 123/76 | HR 78 | Resp 20 | Ht 70.0 in | Wt 212.0 lb

## 2015-11-22 DIAGNOSIS — G4733 Obstructive sleep apnea (adult) (pediatric): Secondary | ICD-10-CM | POA: Diagnosis not present

## 2015-11-22 DIAGNOSIS — Z9989 Dependence on other enabling machines and devices: Secondary | ICD-10-CM | POA: Diagnosis not present
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
## 2015-12-03 ENCOUNTER — Encounter: Payer: Self-pay | Admitting: Emergency Medicine

## 2015-12-06 ENCOUNTER — Encounter: Payer: Self-pay | Admitting: Physician Assistant

## 2015-12-06 DIAGNOSIS — Q282 Arteriovenous malformation of cerebral vessels: Secondary | ICD-10-CM | POA: Insufficient documentation

## 2015-12-07 ENCOUNTER — Institutional Professional Consult (permissible substitution): Payer: 59 | Admitting: Neurology

## 2015-12-23 ENCOUNTER — Ambulatory Visit (INDEPENDENT_AMBULATORY_CARE_PROVIDER_SITE_OTHER): Payer: 59 | Admitting: Physician Assistant

## 2015-12-23 ENCOUNTER — Encounter: Payer: Self-pay | Admitting: Physician Assistant

## 2015-12-23 VITALS — BP 126/84 | HR 75 | Temp 98.2°F | Resp 16 | Ht 70.0 in | Wt 212.0 lb

## 2015-12-23 DIAGNOSIS — I10 Essential (primary) hypertension: Secondary | ICD-10-CM

## 2015-12-23 DIAGNOSIS — Z23 Encounter for immunization: Secondary | ICD-10-CM | POA: Diagnosis not present

## 2015-12-23 DIAGNOSIS — F3132 Bipolar disorder, current episode depressed, moderate: Secondary | ICD-10-CM

## 2015-12-23 DIAGNOSIS — G4733 Obstructive sleep apnea (adult) (pediatric): Secondary | ICD-10-CM | POA: Insufficient documentation

## 2015-12-23 LAB — COMPREHENSIVE METABOLIC PANEL
ALT: 15 U/L (ref 0–53)
AST: 11 U/L (ref 0–37)
Albumin: 4.7 g/dL (ref 3.5–5.2)
Alkaline Phosphatase: 50 U/L (ref 39–117)
BILIRUBIN TOTAL: 0.4 mg/dL (ref 0.2–1.2)
BUN: 19 mg/dL (ref 6–23)
CALCIUM: 9.2 mg/dL (ref 8.4–10.5)
CO2: 29 meq/L (ref 19–32)
Chloride: 101 mEq/L (ref 96–112)
Creatinine, Ser: 0.91 mg/dL (ref 0.40–1.50)
GFR: 91.34 mL/min (ref >60.00)
GLUCOSE: 103 mg/dL — AB (ref 70–99)
Potassium: 4.4 mEq/L (ref 3.5–5.1)
Sodium: 138 mEq/L (ref 135–145)
Total Protein: 6.7 g/dL (ref 6.0–8.3)

## 2015-12-23 LAB — LDL CHOLESTEROL, DIRECT: Direct LDL: 121 mg/dL

## 2015-12-23 LAB — LIPID PANEL
CHOL/HDL RATIO: 5
CHOLESTEROL: 216 mg/dL — AB (ref 0–200)
HDL: 41.2 mg/dL (ref >39.00)
NONHDL: 174.6
Triglycerides: 221 mg/dL — ABNORMAL HIGH (ref 0.0–149.0)
VLDL: 44.2 mg/dL — ABNORMAL HIGH (ref 0.0–40.0)

## 2015-12-23 NOTE — Assessment & Plan Note (Signed)
BP stable today. Will check labs. Continue current regimen. FU every 6 months.

## 2015-12-23 NOTE — Assessment & Plan Note (Signed)
Followed by Psychiatry. Doing very well on current regimen. Continue medications as directed.

## 2015-12-23 NOTE — Progress Notes (Signed)
Pre visit review using our clinic review tool, if applicable. No additional management support is needed unless otherwise documented below in the visit note. 

## 2015-12-23 NOTE — Assessment & Plan Note (Signed)
CPAP nightly. BP stable. Followed by [facility].

## 2015-12-23 NOTE — Patient Instructions (Signed)
Please continue chronic medications as directed. Please schedule follow-up with your Psychiatrist.   I am checking your cholesterol and electrolytes today due to the medications you are on.  Please increase aerobic exercise to 150 minutes per week.   Please schedule an appointment for a complete physical when you are due.   Follow-up with me every 6 months for chronic issues.   DASH Eating Plan DASH stands for "Dietary Approaches to Stop Hypertension." The DASH eating plan is a healthy eating plan that has been shown to reduce high blood pressure (hypertension). Additional health benefits may include reducing the risk of type 2 diabetes mellitus, heart disease, and stroke. The DASH eating plan may also help with weight loss. What do I need to know about the DASH eating plan? For the DASH eating plan, you will follow these general guidelines:  Choose foods with less than 150 milligrams of sodium per serving (as listed on the food label).  Use salt-free seasonings or herbs instead of table salt or sea salt.  Check with your health care provider or pharmacist before using salt substitutes.  Eat lower-sodium products. These are often labeled as "low-sodium" or "no salt added."  Eat fresh foods. Avoid eating a lot of canned foods.  Eat more vegetables, fruits, and low-fat dairy products.  Choose whole grains. Look for the word "whole" as the first word in the ingredient list.  Choose fish and skinless chicken or [place] more often than red meat. Limit fish, poultry, and meat to 6 oz (170 g) each day.  Limit sweets, desserts, sugars, and sugary drinks.  Choose heart-healthy fats.  Eat more home-cooked food and less restaurant, buffet, and fast food.  Limit fried foods.  Do not fry foods. Cook foods using methods such as baking, boiling, grilling, and broiling instead.  When eating at a restaurant, ask that your food be prepared with less salt, or no salt if possible. What foods  can I eat? Seek help from a dietitian for individual calorie needs. Grains  Whole grain or whole wheat bread. Brown rice. Whole grain or whole wheat pasta. Quinoa, bulgur, and whole grain cereals. Low-sodium cereals. Corn or whole wheat flour tortillas. Whole grain cornbread. Whole grain crackers. Low-sodium crackers. Vegetables  Fresh or frozen vegetables (raw, steamed, roasted, or grilled). Low-sodium or reduced-sodium tomato and vegetable juices. Low-sodium or reduced-sodium tomato sauce and paste. Low-sodium or reduced-sodium canned vegetables. Fruits  All fresh, canned (in natural juice), or frozen fruits. Meat and Other Protein Products  Ground beef (85% or leaner), grass-fed beef, or beef trimmed of fat. Skinless chicken or [place]. Ground chicken or [place]. Pork trimmed of fat. All fish and seafood. Eggs. Dried beans, peas, or lentils. Unsalted nuts and seeds. Unsalted canned beans. Dairy  Low-fat dairy products, such as skim or 1% milk, 2% or reduced-fat cheeses, low-fat ricotta or cottage cheese, or plain low-fat yogurt. Low-sodium or reduced-sodium cheeses. Fats and Oils  Tub margarines without trans fats. Light or reduced-fat mayonnaise and salad dressings (reduced sodium). Avocado. Safflower, olive, or canola oils. Natural peanut or almond butter. Other  Unsalted popcorn and pretzels. The items listed above may not be a complete list of recommended foods or beverages. Contact your dietitian for more options.  What foods are not recommended? Grains  White bread. White pasta. White rice. Refined cornbread. Bagels and croissants. Crackers that contain trans fat. Vegetables  Creamed or fried vegetables. Vegetables in a cheese sauce. Regular canned vegetables. Regular canned tomato sauce and paste. Regular  tomato and vegetable juices. Fruits  Canned fruit in light or heavy syrup. Fruit juice. Meat and Other Protein Products  Fatty cuts of meat. Ribs, chicken wings, bacon, sausage,  bologna, salami, chitterlings, fatback, hot dogs, bratwurst, and packaged luncheon meats. Salted nuts and seeds. Canned beans with salt. Dairy  Whole or 2% milk, cream, half-and-half, and cream cheese. Whole-fat or sweetened yogurt. Full-fat cheeses or blue cheese. Nondairy creamers and whipped toppings. Processed cheese, cheese spreads, or cheese curds. Condiments  Onion and garlic salt, seasoned salt, table salt, and sea salt. Canned and packaged gravies. Worcestershire sauce. Tartar sauce. Barbecue sauce. Teriyaki sauce. Soy sauce, including reduced sodium. Steak sauce. Fish sauce. Oyster sauce. Cocktail sauce. Horseradish. Ketchup and mustard. Meat flavorings and tenderizers. Bouillon cubes. Hot sauce. Tabasco sauce. Marinades. Taco seasonings. Relishes. Fats and Oils  Butter, stick margarine, lard, shortening, ghee, and bacon fat. Coconut, palm kernel, or palm oils. Regular salad dressings. Other  Pickles and olives. Salted popcorn and pretzels. The items listed above may not be a complete list of foods and beverages to avoid. Contact your dietitian for more information.  Where can I find more information? National Heart, Lung, and Blood Institute: travelstabloid.com This information is not intended to replace advice given to you by your health care provider. Make sure you discuss any questions you have with your health care provider. Document Released: 12/08/2010 Document Revised: 05/27/2015 Document Reviewed: 10/23/2012 Elsevier Interactive Patient Education  2017 [employer].

## 2015-12-23 NOTE — Progress Notes (Signed)
Patient presents to clinic today to establish care. Body mass index is 30.42 kg/m. Denies any regular exercise. Endorses well-balanced diet overall. Endorses could eat more fiber. Drinks mainly water and soda water but does have coffee in the morning.   Acute Concerns: Denies acute concerns at today's visit.   Chronic Issues: OSA -- CPAP nightly. Endorses resting well. Averages about 7 hours per night. Is followed by Pulmonology.   Hypertension -- Is currently on lisinopril-HCTZ 20-25 mg daily. Is taking medications as directed. Patient denies chest pain, palpitations, lightheadedness, dizziness, vision changes or frequent headaches.  Cavernous Hemangioma -- Followed by Neurology. BP has been well controlled per patient. Denies history of diabetes or hyperlipidemia. Notes it has been a while since those values have been checked.    Bipolar Disorder -- Patient followed by [facility] (Dr. Creig Hines). Is currently on regimen of Wellbutrin XL 150 mg daily, Depakote XR 3,000 mg daily and Valium 5 mg BID PRN. Is seeing specialist every 6 months. Denies significant depressed mood. Endorses some more energy but denies manic episodes. Denies SI/HI.   Health Maintenance: Immunizations -- Agrees to flu shot. Tetanus up-to-date.  Colonoscopy -- Up-to-date.   Past Medical History:  Diagnosis Date  . Anxiety   . Atrial arrhythmia   . Barrett esophagus   . Bipolar 1 disorder ([place])   . Depression   . History of chicken pox   . Hypertension   . Sleep apnea   . Sleep apnea     Past Surgical History:  Procedure Laterality Date  . APPENDECTOMY  1970   age 36  . bone spur     age 38  . kidney stones    . TONSILLECTOMY  1968    Current Outpatient Prescriptions on File Prior to Visit  Medication Sig Dispense Refill  . buPROPion (WELLBUTRIN XL) 150 MG 24 hr tablet Take 150 mg by mouth daily.    . diazepam (VALIUM) 5 MG tablet Take 5 mg by mouth 2 (two) times daily.     .  divalproex (DEPAKOTE ER) 500 MG 24 hr tablet Take 3,000 mg by mouth daily.    Marland Kitchen lisinopril-hydrochlorothiazide (PRINZIDE,ZESTORETIC) 20-25 MG tablet Take 1 tablet by mouth daily.  1   No current facility-administered medications on file prior to visit.     No Known Allergies  Family History  Problem Relation Age of Onset  . Aneurysm Maternal Grandmother   . Asthma Mother   . Diabetes Mother   . Hypertension Mother   . Diabetes Brother   . Diabetes Brother   . Pancreatic cancer Father   . Diabetes Father   . Cancer Father     Pancreatic  . Diabetes Paternal Grandmother   . Diabetes Paternal Grandfather   . Colon cancer Neg Hx     Social History   Social History  . Marital status: Married    Spouse name: [name]   . Number of children: 3  . Years of education: 16+   Occupational History  . 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 Smoking status: Never Smoker  . Smokeless tobacco: Never Used  . Alcohol use 0.0 oz/week     Comment: 4-5 beers per week  . Drug use: No  . Sexual activity: Yes   Other Topics Concern  . Not on file   Social History Narrative   Lives in [place].    Married. 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      Caffeine use: 2 cup coffee per day      Review of Systems  Constitutional: Negative for fever and weight loss.  HENT: Negative for ear discharge, ear pain, hearing loss and tinnitus.   Eyes: Negative for blurred vision, double vision, photophobia and pain.  Respiratory: Negative for cough and shortness of breath.   Cardiovascular: Negative for chest pain and palpitations.  Gastrointestinal: Negative for abdominal pain, blood in stool, constipation, diarrhea, heartburn, melena, nausea and vomiting.  Genitourinary: Negative for dysuria, flank pain, frequency, hematuria and urgency.  Musculoskeletal: Negative for falls.  Neurological: Negative for dizziness, loss of consciousness and  headaches.  Endo/Heme/Allergies: Negative for environmental allergies.  Psychiatric/Behavioral: Negative for depression, hallucinations, substance abuse and suicidal ideas. The patient is not nervous/anxious and does not have insomnia.    BP 126/84   Pulse 75   Temp 98.2 F (36.8 C) (Oral)   Resp 16   Ht 5\' 10"  (1.778 m)   Wt 212 lb (96.2 kg)   SpO2 96%   BMI 30.42 kg/m   Physical Exam  Constitutional: He is oriented to person, place, and time and well-developed, well-nourished, and in no distress.  HENT:  Head: Normocephalic and atraumatic.  Right Ear: External ear normal.  Left Ear: External ear normal.  Nose: Nose normal.  Mouth/Throat: Oropharynx is clear and moist. No oropharyngeal exudate.  TM within normal limits  Eyes: Conjunctivae are normal.  Neck: Neck supple.  Cardiovascular: Normal rate, regular rhythm, normal heart sounds and intact distal pulses.   Pulmonary/Chest: Effort normal and breath sounds normal. No respiratory distress. He has no wheezes. He has no rales. He exhibits no tenderness.  Neurological: He is alert and oriented to person, place, and time.  Skin: Skin is warm and dry. No rash noted.  Psychiatric: Affect normal.  Vitals reviewed.  Assessment/Plan: HTN (hypertension) BP stable today. Will check labs. Continue current regimen. FU every 6 months.   Bipolar disorder ([place]) Followed by Psychiatry. Doing very well on current regimen. Continue medications as directed.   Obstructive sleep apnea CPAP nightly. BP stable. Followed by [facility].     Leeanne Rio, PA-C

## 2016-01-03 DIAGNOSIS — G4733 Obstructive sleep apnea (adult) (pediatric): Secondary | ICD-10-CM | POA: Diagnosis not present

## 2016-01-10 DIAGNOSIS — G4733 Obstructive sleep apnea (adult) (pediatric): Secondary | ICD-10-CM | POA: Diagnosis not present

## 2016-02-03 DIAGNOSIS — G4733 Obstructive sleep apnea (adult) (pediatric): Secondary | ICD-10-CM | POA: Diagnosis not present

## 2016-03-02 DIAGNOSIS — G4733 Obstructive sleep apnea (adult) (pediatric): Secondary | ICD-10-CM | POA: Diagnosis not present

## 2016-03-19 ENCOUNTER — Encounter: Payer: Self-pay | Admitting: Neurology

## 2016-03-21 ENCOUNTER — Encounter: Payer: Self-pay | Admitting: Neurology

## 2016-03-21 ENCOUNTER — Ambulatory Visit (INDEPENDENT_AMBULATORY_CARE_PROVIDER_SITE_OTHER): Payer: 59 | Admitting: Neurology

## 2016-03-21 VITALS — BP 115/71 | HR 71 | Resp 20 | Ht 70.0 in | Wt 218.0 lb

## 2016-03-21 DIAGNOSIS — Z9989 Dependence on other enabling machines and devices: Secondary | ICD-10-CM

## 2016-03-21 DIAGNOSIS — G4733 Obstructive sleep apnea (adult) (pediatric): Secondary | ICD-10-CM

## 2016-03-21 NOTE — Patient Instructions (Signed)
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
## 2016-04-02 DIAGNOSIS — G4733 Obstructive sleep apnea (adult) (pediatric): Secondary | ICD-10-CM | POA: Diagnosis not present

## 2016-04-14 ENCOUNTER — Encounter: Payer: Self-pay | Admitting: Physician Assistant

## 2016-05-02 DIAGNOSIS — G4733 Obstructive sleep apnea (adult) (pediatric): Secondary | ICD-10-CM | POA: Diagnosis not present

## 2016-06-02 DIAGNOSIS — K219 Gastro-esophageal reflux disease without esophagitis: Secondary | ICD-10-CM | POA: Diagnosis not present

## 2016-06-02 DIAGNOSIS — G4733 Obstructive sleep apnea (adult) (pediatric): Secondary | ICD-10-CM | POA: Diagnosis not present

## 2016-06-02 DIAGNOSIS — J209 Acute bronchitis, unspecified: Secondary | ICD-10-CM | POA: Diagnosis not present

## 2016-07-02 DIAGNOSIS — G4733 Obstructive sleep apnea (adult) (pediatric): Secondary | ICD-10-CM | POA: Diagnosis not present

## 2016-07-03 ENCOUNTER — Encounter: Payer: 59 | Admitting: Physician Assistant

## 2016-07-07 ENCOUNTER — Ambulatory Visit (INDEPENDENT_AMBULATORY_CARE_PROVIDER_SITE_OTHER): Payer: 59 | Admitting: Physician Assistant

## 2016-07-07 ENCOUNTER — Encounter: Payer: Self-pay | Admitting: Physician Assistant

## 2016-07-07 VITALS — BP 120/80 | HR 64 | Temp 98.6°F | Resp 14 | Ht 70.0 in | Wt 215.0 lb

## 2016-07-07 DIAGNOSIS — R7989 Other specified abnormal findings of blood chemistry: Secondary | ICD-10-CM

## 2016-07-07 DIAGNOSIS — Z0001 Encounter for general adult medical examination with abnormal findings: Secondary | ICD-10-CM

## 2016-07-07 DIAGNOSIS — Z125 Encounter for screening for malignant neoplasm of prostate: Secondary | ICD-10-CM | POA: Diagnosis not present

## 2016-07-07 DIAGNOSIS — D1801 Hemangioma of skin and subcutaneous tissue: Secondary | ICD-10-CM | POA: Diagnosis not present

## 2016-07-07 DIAGNOSIS — D1802 Hemangioma of intracranial structures: Secondary | ICD-10-CM

## 2016-07-07 DIAGNOSIS — I10 Essential (primary) hypertension: Secondary | ICD-10-CM | POA: Diagnosis not present

## 2016-07-07 DIAGNOSIS — F3132 Bipolar disorder, current episode depressed, moderate: Secondary | ICD-10-CM | POA: Diagnosis not present

## 2016-07-07 DIAGNOSIS — Z0000 Encounter for general adult medical examination without abnormal findings: Secondary | ICD-10-CM | POA: Insufficient documentation

## 2016-07-07 LAB — COMPREHENSIVE METABOLIC PANEL
ALT: 13 U/L (ref 0–53)
AST: 11 U/L (ref 0–37)
Albumin: 4.7 g/dL (ref 3.5–5.2)
Alkaline Phosphatase: 51 U/L (ref 39–117)
BUN: 17 mg/dL (ref 6–23)
CHLORIDE: 100 meq/L (ref 96–112)
CO2: 31 meq/L (ref 19–32)
Calcium: 10 mg/dL (ref 8.4–10.5)
Creatinine, Ser: 1.05 mg/dL (ref 0.40–1.50)
GFR: 77.28 mL/min (ref >60.00)
GLUCOSE: 109 mg/dL — AB (ref 70–99)
Potassium: 4.4 mEq/L (ref 3.5–5.1)
SODIUM: 138 meq/L (ref 135–145)
Total Bilirubin: 0.6 mg/dL (ref 0.2–1.2)
Total Protein: 6.6 g/dL (ref 6.0–8.3)

## 2016-07-07 LAB — CBC WITH DIFFERENTIAL/PLATELET
BASOS ABS: 0.1 10*3/uL (ref 0.0–0.1)
Basophils Relative: 0.6 % (ref 0.0–3.0)
EOS ABS: 0.8 10*3/uL — AB (ref 0.0–0.7)
Eosinophils Relative: 8.6 % — ABNORMAL HIGH (ref 0.0–5.0)
HCT: 45.1 % (ref 39.0–52.0)
HEMOGLOBIN: 15.3 g/dL (ref 13.0–17.0)
Lymphocytes Relative: 18 % (ref 12.0–46.0)
Lymphs Abs: 1.7 10*3/uL (ref 0.7–4.0)
MCHC: 33.9 g/dL (ref 30.0–36.0)
MCV: 89 fl (ref 78.0–100.0)
MONO ABS: 0.7 10*3/uL (ref 0.1–1.0)
Monocytes Relative: 7.3 % (ref 3.0–12.0)
Neutro Abs: 6.4 10*3/uL (ref 1.4–7.7)
Neutrophils Relative %: 65.5 % (ref 43.0–77.0)
Platelets: 197 10*3/uL (ref 150.0–400.0)
RBC: 5.07 Mil/uL (ref 4.22–5.81)
RDW: 13.5 % (ref 11.5–15.5)
WBC: 9.7 10*3/uL (ref 4.0–10.5)

## 2016-07-07 LAB — URINALYSIS, ROUTINE W REFLEX MICROSCOPIC
Bilirubin Urine: NEGATIVE
HGB URINE DIPSTICK: NEGATIVE
Ketones, ur: NEGATIVE
LEUKOCYTES UA: NEGATIVE
Nitrite: NEGATIVE
Specific Gravity, Urine: 1.01 (ref 1.000–1.030)
TOTAL PROTEIN, URINE-UPE24: NEGATIVE
UROBILINOGEN UA: 0.2 (ref 0.0–1.0)
Urine Glucose: NEGATIVE
pH: 6 (ref 5.0–8.0)

## 2016-07-07 LAB — LIPID PANEL
Cholesterol: 218 mg/dL — ABNORMAL HIGH (ref 0–200)
HDL: 38.4 mg/dL — ABNORMAL LOW (ref >39.00)
NonHDL: 179.82
Total CHOL/HDL Ratio: 6
Triglycerides: 278 mg/dL — ABNORMAL HIGH (ref 0.0–149.0)
VLDL: 55.6 mg/dL — ABNORMAL HIGH (ref 0.0–40.0)

## 2016-07-07 LAB — LDL CHOLESTEROL, DIRECT: LDL DIRECT: 131 mg/dL

## 2016-07-07 LAB — PSA: PSA: 1.18 ng/mL (ref 0.10–4.00)

## 2016-07-07 LAB — TSH: TSH: 0.9 u[IU]/mL (ref 0.35–4.50)

## 2016-07-07 LAB — HEMOGLOBIN A1C: Hgb A1c MFr Bld: 5.9 % (ref 4.6–6.5)

## 2016-07-07 MED ORDER — LISINOPRIL-HYDROCHLOROTHIAZIDE 20-25 MG PO TABS: 1.0000 | ORAL_TABLET | Freq: Every day | ORAL | 1 refills | Status: DC

## 2016-07-07 NOTE — Assessment & Plan Note (Signed)
BP well-controlled. Asymptomatic. Labs today. Continue current regimen.

## 2016-07-07 NOTE — Assessment & Plan Note (Signed)
Patient endorses doing well. Continue care as directed by Psychiatry. Follow-up as scheduled.

## 2016-07-07 NOTE — Progress Notes (Signed)
Pre visit review using our clinic review tool, if applicable. No additional management support is needed unless otherwise documented below in the visit note. 

## 2016-07-07 NOTE — Assessment & Plan Note (Signed)
Depression screen negative. Health Maintenance reviewed. Preventive schedule discussed and handout given in AVS. Will obtain fasting labs today.  

## 2016-07-07 NOTE — Assessment & Plan Note (Signed)
Referral to dermatology placed.  

## 2016-07-07 NOTE — Assessment & Plan Note (Signed)
The natural history of prostate cancer and ongoing controversy regarding screening and potential treatment outcomes of prostate cancer has been discussed with the patient. The meaning of a false positive PSA and a false negative PSA has been discussed. He indicates understanding of the limitations of this screening test and wishes  to proceed with screening PSA testing.  

## 2016-07-07 NOTE — Patient Instructions (Signed)
Please go to the lab for blood work.   Our office will call you with your results unless you have chosen to receive results via MyChart.  If your blood work is normal we will follow-up each year for physicals and as scheduled for chronic medical problems.  If anything is abnormal we will treat accordingly and get you in for a follow-up.  Continue medications as directed.  You will be contacted to schedule an appointment with Dermatology.    Preventive Care 40-64 Years, Male Preventive care refers to lifestyle choices and visits with your health care provider that can promote health and wellness. What does preventive care include?  A yearly physical exam. This is also called an annual well check.  Dental exams once or twice a year.  Routine eye exams. Ask your health care provider how often you should have your eyes checked.  Personal lifestyle choices, including: ? Daily care of your teeth and gums. ? Regular physical activity. ? Eating a healthy diet. ? Avoiding tobacco and drug use. ? Limiting alcohol use. ? Practicing safe sex. ? Taking low-dose aspirin every day starting at age 82. What happens during an annual well check? The services and screenings done by your health care provider during your annual well check will depend on your age, overall health, lifestyle risk factors, and family history of disease. Counseling Your health care provider may ask you questions about your:  Alcohol use.  Tobacco use.  Drug use.  Emotional well-being.  Home and relationship well-being.  Sexual activity.  Eating habits.  Work and work Statistician.  Screening You may have the following tests or measurements:  Height, weight, and BMI.  Blood pressure.  Lipid and cholesterol levels. These may be checked every 5 years, or more frequently if you are over 69 years old.  Skin check.  Lung cancer screening. You may have this screening every year starting at age 73 if you  have a 30-pack-year history of smoking and currently smoke or have quit within the past 15 years.  Fecal occult blood test (FOBT) of the stool. You may have this test every year starting at age 80.  Flexible sigmoidoscopy or colonoscopy. You may have a sigmoidoscopy every 5 years or a colonoscopy every 10 years starting at age 59.  Prostate cancer screening. Recommendations will vary depending on your family history and other risks.  Hepatitis C blood test.  Hepatitis B blood test.  Sexually transmitted disease (STD) testing.  Diabetes screening. This is done by checking your blood sugar (glucose) after you have not eaten for a while (fasting). You may have this done every 1-3 years.  Discuss your test results, treatment options, and if necessary, the need for more tests with your health care provider. Vaccines Your health care provider may recommend certain vaccines, such as:  Influenza vaccine. This is recommended every year.  Tetanus, diphtheria, and acellular pertussis (Tdap, Td) vaccine. You may need a Td booster every 10 years.  Varicella vaccine. You may need this if you have not been vaccinated.  Zoster vaccine. You may need this after age 84.  Measles, mumps, and rubella (MMR) vaccine. You may need at least one dose of MMR if you were born in 1957 or later. You may also need a second dose.  Pneumococcal 13-valent conjugate (PCV13) vaccine. You may need this if you have certain conditions and have not been vaccinated.  Pneumococcal polysaccharide (PPSV23) vaccine. You may need one or two doses if you smoke cigarettes  or if you have certain conditions.  Meningococcal vaccine. You may need this if you have certain conditions.  Hepatitis A vaccine. You may need this if you have certain conditions or if you travel or work in places where you may be exposed to hepatitis A.  Hepatitis B vaccine. You may need this if you have certain conditions or if you travel or work in  places where you may be exposed to hepatitis B.  Haemophilus influenzae type b (Hib) vaccine. You may need this if you have certain risk factors.  Talk to your health care provider about which screenings and vaccines you need and how often you need them. This information is not intended to replace advice given to you by your health care provider. Make sure you discuss any questions you have with your health care provider. Document Released: 01/15/2015 Document Revised: 09/08/2015 Document Reviewed: 10/20/2014 Elsevier Interactive Patient Education  2017 [employer].

## 2016-07-07 NOTE — Progress Notes (Signed)
Patient presents to clinic today for annual exam.  Patient is fasting for labs. Diet -- Endorses well-balanced diet overall Exercise -- No current exercise at present. Body mass index is 30.85 kg/m.  Acute Concerns: Patient noting lesions of anterior legs that have increased in number over the past year. States they are dark, violaceous and wondering if they are related to history of cavernous hemangioma. Denies history of skin cancer.   Chronic Issues: Hypertension -- Is currently on a regimen of lisinopril-HCTZ 20-25 mg. Is taking medications as directed. Patient denies chest pain, palpitations, lightheadedness, dizziness, vision changes or frequent headaches.  BP Readings from Last 3 Encounters:  07/07/16 120/80  03/21/16 115/71  12/23/15 126/84   Bipolar Disorder -- Followed by Psychiatry. Is currently on a regimen of Depakote, Wellbutrin and Diazepam. Endorses doing very well with this regimen. Denies SI/HI or manic episodes. Has follow-up appointment scheduled with Psychiatry in August. .   Health Maintenance: Immunizations -- up-to-date per patient.  Colonoscopy -- up-to-date.   Past Medical History:  Diagnosis Date  . Anxiety   . Atrial arrhythmia   . Barrett esophagus   . Bipolar 1 disorder ([place])   . Depression   . History of chicken pox   . Hypertension   . Sleep apnea   . Sleep apnea     Past Surgical History:  Procedure Laterality Date  . APPENDECTOMY  1970   age 36  . bone spur     age 40  . kidney stones    . TONSILLECTOMY  1968    Current Outpatient Prescriptions on File Prior to Visit  Medication Sig Dispense Refill  . buPROPion (WELLBUTRIN XL) 150 MG 24 hr tablet Take 150 mg by mouth daily.    . diazepam (VALIUM) 5 MG tablet Take 5 mg by mouth 2 (two) times daily.     Marland Kitchen lisinopril-hydrochlorothiazide (PRINZIDE,ZESTORETIC) 20-25 MG tablet Take 1 tablet by mouth daily.  1  . divalproex (DEPAKOTE ER) 500 MG 24 hr tablet Take 3,000 mg by mouth  daily.     No current facility-administered medications on file prior to visit.     No Known Allergies  Family History  Problem Relation Age of Onset  . Aneurysm Maternal Grandmother   . Asthma Mother   . Diabetes Mother   . Hypertension Mother   . Diabetes Brother   . Diabetes Brother   . Pancreatic cancer Father   . Diabetes Father   . Cancer Father        Pancreatic  . Diabetes Paternal Grandmother   . Diabetes Paternal Grandfather   . Colon cancer Neg Hx     Social History   Social History  . Marital status: Married    Spouse name: [name]   . Number of children: 3  . Years of education: 16+   Occupational History  . 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 Smoking status: Never Smoker  . Smokeless tobacco: Never Used  . Alcohol use 0.0 oz/week     Comment: 4-5 beers per week  . Drug use: No  . Sexual activity: Yes   Other Topics Concern  . Not on file   Social History Narrative   Lives in [place].    Married. 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      Caffeine use: 2 cup coffee per day      Review of Systems  Constitutional: Negative for fever and weight loss.  HENT: Negative for ear discharge, ear pain, hearing loss and tinnitus.   Eyes: Negative for blurred vision, double vision, photophobia and pain.  Respiratory: Negative for cough and shortness of breath.   Cardiovascular: Negative for chest pain and palpitations.  Gastrointestinal: Negative for abdominal pain, blood in stool, constipation, diarrhea, heartburn, melena, nausea and vomiting.  Genitourinary: Negative for dysuria, flank pain, frequency, hematuria and urgency.  Musculoskeletal: Negative for falls.  Neurological: Negative for dizziness, loss of consciousness and headaches.  Endo/Heme/Allergies: Negative for environmental allergies.  Psychiatric/Behavioral: Negative for depression, hallucinations, substance abuse and suicidal ideas.  The patient is not nervous/anxious and does not have insomnia.     BP 120/80   Pulse 64   Temp 98.6 F (37 C) (Oral)   Resp 14   Ht 5\' 10"  (1.778 m)   Wt 215 lb (97.5 kg)   SpO2 98%   BMI 30.85 kg/m   Physical Exam  Constitutional: He is oriented to person, place, and time and well-developed, well-nourished, and in no distress.  HENT:  Head: Normocephalic and atraumatic.  Right Ear: External ear normal.  Left Ear: External ear normal.  Nose: Nose normal.  Mouth/Throat: Oropharynx is clear and moist. No oropharyngeal exudate.  Eyes: Conjunctivae and EOM are normal. Pupils are equal, round, and reactive to light.  Neck: Neck supple. No thyromegaly present.  Cardiovascular: Normal rate, regular rhythm, normal heart sounds and intact distal pulses.   Pulmonary/Chest: Effort normal and breath sounds normal. No respiratory distress. He has no wheezes. He has no rales. He exhibits no tenderness.  Abdominal: Soft. Bowel sounds are normal. He exhibits no distension and no mass. There is no tenderness. There is no rebound and no guarding.  Genitourinary: Testes/scrotum normal and penis normal. No discharge found.  Lymphadenopathy:    He has no cervical adenopathy.  Neurological: He is alert and oriented to person, place, and time.  Skin: Skin is warm and dry.     Psychiatric: Affect normal.  Vitals reviewed.  Assessment/Plan: Visit for preventive health examination Depression screen negative. Health Maintenance reviewed. Preventive schedule discussed and handout given in AVS. Will obtain fasting labs today.   Prostate cancer screening The natural history of prostate cancer and ongoing controversy regarding screening and potential treatment outcomes of prostate cancer has been discussed with the patient. The meaning of a false positive PSA and a false negative PSA has been discussed. He indicates understanding of the limitations of this screening test and wishes  to proceed with  screening PSA testing.   HTN (hypertension) BP well-controlled. Asymptomatic. Labs today. Continue current regimen.   Hemangioma of skin and subcutaneous tissue Referral to dermatology placed.   Cavernous hemangioma of brain ([place]) Followed by Neurology. Asymptomatic. BP well-controlled. Lipids today.  Bipolar disorder [facility]) Patient endorses doing well. Continue care as directed by Psychiatry. Follow-up as scheduled.    Leeanne Rio, PA-C

## 2016-07-07 NOTE — Assessment & Plan Note (Signed)
Followed by Neurology. Asymptomatic. BP well-controlled. Lipids today.

## 2016-07-10 ENCOUNTER — Other Ambulatory Visit: Payer: Self-pay | Admitting: *Deleted

## 2016-07-10 ENCOUNTER — Other Ambulatory Visit: Payer: Self-pay | Admitting: Physician Assistant

## 2016-07-10 ENCOUNTER — Other Ambulatory Visit (INDEPENDENT_AMBULATORY_CARE_PROVIDER_SITE_OTHER): Payer: 59

## 2016-07-10 DIAGNOSIS — D1801 Hemangioma of skin and subcutaneous tissue: Secondary | ICD-10-CM | POA: Diagnosis not present

## 2016-07-10 DIAGNOSIS — D485 Neoplasm of uncertain behavior of skin: Secondary | ICD-10-CM | POA: Diagnosis not present

## 2016-07-10 DIAGNOSIS — R8290 Unspecified abnormal findings in urine: Secondary | ICD-10-CM

## 2016-07-10 DIAGNOSIS — D2372 Other benign neoplasm of skin of left lower limb, including hip: Secondary | ICD-10-CM | POA: Diagnosis not present

## 2016-07-10 DIAGNOSIS — D225 Melanocytic nevi of trunk: Secondary | ICD-10-CM | POA: Diagnosis not present

## 2016-07-10 DIAGNOSIS — D2371 Other benign neoplasm of skin of right lower limb, including hip: Secondary | ICD-10-CM | POA: Diagnosis not present

## 2016-07-10 LAB — URINALYSIS, MICROSCOPIC ONLY
RBC / HPF: NONE SEEN (ref 0–?)
WBC, UA: NONE SEEN (ref 0–?)

## 2016-07-10 LAB — POCT URINALYSIS DIPSTICK
BILIRUBIN UA: NEGATIVE
Blood, UA: NEGATIVE
GLUCOSE UA: NEGATIVE
KETONES UA: NEGATIVE
Leukocytes, UA: NEGATIVE
Nitrite, UA: NEGATIVE
Protein, UA: NEGATIVE
Spec Grav, UA: 1.015 (ref 1.010–1.025)
Urobilinogen, UA: 0.2 E.U./dL
pH, UA: 6 (ref 5.0–8.0)

## 2016-07-14 ENCOUNTER — Encounter: Payer: Self-pay | Admitting: Physician Assistant

## 2016-08-02 DIAGNOSIS — G4733 Obstructive sleep apnea (adult) (pediatric): Secondary | ICD-10-CM | POA: Diagnosis not present

## 2016-09-02 DIAGNOSIS — G4733 Obstructive sleep apnea (adult) (pediatric): Secondary | ICD-10-CM | POA: Diagnosis not present

## 2016-09-27 ENCOUNTER — Encounter (HOSPITAL_COMMUNITY): Payer: Self-pay | Admitting: Emergency Medicine

## 2016-09-27 ENCOUNTER — Inpatient Hospital Stay (HOSPITAL_COMMUNITY)
Admission: EM | Admit: 2016-09-27 | Discharge: 2016-09-30 | DRG: 064 | Disposition: A | Discharge status: Home / Self Care | Payer: 59 | Attending: Neurology | Admitting: Neurology

## 2016-09-27 ENCOUNTER — Emergency Department (HOSPITAL_COMMUNITY): Payer: 59

## 2016-09-27 ENCOUNTER — Inpatient Hospital Stay (HOSPITAL_COMMUNITY): Payer: 59

## 2016-09-27 DIAGNOSIS — E785 Hyperlipidemia, unspecified: Secondary | ICD-10-CM | POA: Diagnosis present

## 2016-09-27 DIAGNOSIS — I613 Nontraumatic intracerebral hemorrhage in brain stem: Secondary | ICD-10-CM

## 2016-09-27 DIAGNOSIS — I10 Essential (primary) hypertension: Secondary | ICD-10-CM | POA: Diagnosis present

## 2016-09-27 DIAGNOSIS — R29701 NIHSS score 1: Secondary | ICD-10-CM | POA: Diagnosis present

## 2016-09-27 DIAGNOSIS — I629 Nontraumatic intracranial hemorrhage, unspecified: Secondary | ICD-10-CM

## 2016-09-27 DIAGNOSIS — Z8249 Family history of ischemic heart disease and other diseases of the circulatory system: Secondary | ICD-10-CM

## 2016-09-27 DIAGNOSIS — Z87442 Personal history of urinary calculi: Secondary | ICD-10-CM

## 2016-09-27 DIAGNOSIS — I6789 Other cerebrovascular disease: Secondary | ICD-10-CM | POA: Diagnosis not present

## 2016-09-27 DIAGNOSIS — G4733 Obstructive sleep apnea (adult) (pediatric): Secondary | ICD-10-CM | POA: Diagnosis present

## 2016-09-27 DIAGNOSIS — Z8673 Personal history of transient ischemic attack (TIA), and cerebral infarction without residual deficits: Secondary | ICD-10-CM

## 2016-09-27 DIAGNOSIS — F419 Anxiety disorder, unspecified: Secondary | ICD-10-CM | POA: Diagnosis present

## 2016-09-27 DIAGNOSIS — F319 Bipolar disorder, unspecified: Secondary | ICD-10-CM | POA: Diagnosis present

## 2016-09-27 DIAGNOSIS — L570 Actinic keratosis: Secondary | ICD-10-CM | POA: Diagnosis not present

## 2016-09-27 DIAGNOSIS — Z23 Encounter for immunization: Secondary | ICD-10-CM | POA: Diagnosis not present

## 2016-09-27 DIAGNOSIS — R200 Anesthesia of skin: Secondary | ICD-10-CM | POA: Diagnosis not present

## 2016-09-27 DIAGNOSIS — Z825 Family history of asthma and other chronic lower respiratory diseases: Secondary | ICD-10-CM

## 2016-09-27 DIAGNOSIS — Z833 Family history of diabetes mellitus: Secondary | ICD-10-CM | POA: Diagnosis not present

## 2016-09-27 DIAGNOSIS — I619 Nontraumatic intracerebral hemorrhage, unspecified: Principal | ICD-10-CM | POA: Diagnosis present

## 2016-09-27 DIAGNOSIS — R569 Unspecified convulsions: Secondary | ICD-10-CM

## 2016-09-27 DIAGNOSIS — Z800 Family history of malignant neoplasm of digestive organs: Secondary | ICD-10-CM | POA: Diagnosis not present

## 2016-09-27 DIAGNOSIS — Q283 Other malformations of cerebral vessels: Secondary | ICD-10-CM | POA: Diagnosis not present

## 2016-09-27 DIAGNOSIS — R29818 Other symptoms and signs involving the nervous system: Secondary | ICD-10-CM | POA: Diagnosis not present

## 2016-09-27 HISTORY — DX: Cerebral infarction, unspecified: I63.9

## 2016-09-27 HISTORY — DX: Congenital malformation of peripheral vascular system, unspecified: Q27.9

## 2016-09-27 HISTORY — DX: Unspecified convulsions: R56.9

## 2016-09-27 HISTORY — DX: Hemangioma unspecified site: D18.00

## 2016-09-27 LAB — COMPREHENSIVE METABOLIC PANEL WITH GFR
ALT: 20 U/L (ref 17–63)
AST: 16 U/L (ref 15–41)
Albumin: 4.3 g/dL (ref 3.5–5.0)
Alkaline Phosphatase: 44 U/L (ref 38–126)
Anion gap: 11 (ref 5–15)
BUN: 17 mg/dL (ref 6–20)
CO2: 26 mmol/L (ref 22–32)
Calcium: 9.3 mg/dL (ref 8.9–10.3)
Chloride: 100 mmol/L — ABNORMAL LOW (ref 101–111)
Creatinine, Ser: 1.02 mg/dL (ref 0.61–1.24)
GFR calc Af Amer: >60 mL/min
GFR calc non Af Amer: >60 mL/min
Glucose, Bld: 107 mg/dL — ABNORMAL HIGH (ref 65–99)
Potassium: 3.9 mmol/L (ref 3.5–5.1)
Sodium: 137 mmol/L (ref 135–145)
Total Bilirubin: 0.5 mg/dL (ref 0.3–1.2)
Total Protein: 6.3 g/dL — ABNORMAL LOW (ref 6.5–8.1)

## 2016-09-27 LAB — CBC
HCT: 43.7 % (ref 39.0–52.0)
Hemoglobin: 14.3 g/dL (ref 13.0–17.0)
MCH: 29.4 pg (ref 26.0–34.0)
MCHC: 32.7 g/dL (ref 30.0–36.0)
MCV: 89.9 fL (ref 78.0–100.0)
PLATELETS: 182 10*3/uL (ref 150–400)
RBC: 4.86 MIL/uL (ref 4.22–5.81)
RDW: 13.1 % (ref 11.5–15.5)
WBC: 6.4 10*3/uL (ref 4.0–10.5)

## 2016-09-27 LAB — DIFFERENTIAL
Basophils Absolute: 0 K/uL (ref 0.0–0.1)
Basophils Relative: 0 %
Eosinophils Absolute: 0.4 K/uL (ref 0.0–0.7)
Eosinophils Relative: 6 %
Lymphocytes Relative: 20 %
Lymphs Abs: 1.3 K/uL (ref 0.7–4.0)
Monocytes Absolute: 0.5 K/uL (ref 0.1–1.0)
Monocytes Relative: 8 %
Neutro Abs: 4.3 K/uL (ref 1.7–7.7)
Neutrophils Relative %: 66 %

## 2016-09-27 LAB — APTT: aPTT: 25 s (ref 24–36)

## 2016-09-27 LAB — I-STAT CHEM 8, ED
BUN: 19 mg/dL (ref 6–20)
CREATININE: 1 mg/dL (ref 0.61–1.24)
Calcium, Ion: 1.1 mmol/L — ABNORMAL LOW (ref 1.15–1.40)
Chloride: 101 mmol/L (ref 101–111)
GLUCOSE: 108 mg/dL — AB (ref 65–99)
HCT: 44 % (ref 39.0–52.0)
HEMOGLOBIN: 15 g/dL (ref 13.0–17.0)
POTASSIUM: 3.9 mmol/L (ref 3.5–5.1)
Sodium: 138 mmol/L (ref 135–145)
TCO2: 27 mmol/L (ref 22–32)

## 2016-09-27 LAB — CBG MONITORING, ED: Glucose-Capillary: 109 mg/dL — ABNORMAL HIGH (ref 65–99)

## 2016-09-27 LAB — PROTIME-INR
INR: 0.95
PROTHROMBIN TIME: 12.6 s (ref 11.4–15.2)

## 2016-09-27 LAB — I-STAT TROPONIN, ED: Troponin i, poc: 0.01 ng/mL (ref 0.00–0.08)

## 2016-09-27 LAB — MRSA PCR SCREENING: MRSA BY PCR: NEGATIVE

## 2016-09-27 MED ORDER — PANTOPRAZOLE SODIUM 40 MG IV SOLR
40.0000 mg | Freq: Every day | INTRAVENOUS | Status: DC
  Administered 2016-09-27: 40 mg via INTRAVENOUS
  Filled 2016-09-27: qty 40

## 2016-09-27 MED ORDER — NICARDIPINE HCL IN NACL 20-0.86 MG/200ML-% IV SOLN: 0.0000 mg/h | INTRAVENOUS | Status: DC | PRN

## 2016-09-27 MED ORDER — INFLUENZA VAC SPLIT QUAD 0.5 ML IM SUSY
0.5000 mL | PREFILLED_SYRINGE | INTRAMUSCULAR | Status: AC
  Administered 2016-09-28: 0.5 mL via INTRAMUSCULAR
  Filled 2016-09-27: qty 0.5

## 2016-09-27 MED ORDER — STROKE: EARLY STAGES OF RECOVERY BOOK
Freq: Once | Status: AC
  Administered 2016-09-27: 09:00:00
  Filled 2016-09-27: qty 1

## 2016-09-27 MED ORDER — SENNOSIDES-DOCUSATE SODIUM 8.6-50 MG PO TABS
1.0000 | ORAL_TABLET | Freq: Two times a day (BID) | ORAL | Status: DC
  Administered 2016-09-27 – 2016-09-29 (×4): 1 via ORAL
  Filled 2016-09-27 (×5): qty 1

## 2016-09-27 MED ORDER — ACETAMINOPHEN 325 MG PO TABS: 650.0000 mg | ORAL_TABLET | ORAL | Status: DC | PRN

## 2016-09-27 MED ORDER — ACETAMINOPHEN 650 MG RE SUPP: 650.0000 mg | RECTAL | Status: DC | PRN

## 2016-09-27 MED ORDER — ACETAMINOPHEN 160 MG/5ML PO SOLN: 650.0000 mg | ORAL | Status: DC | PRN

## 2016-09-27 NOTE — Progress Notes (Signed)
SLP Cancellation Note  Patient Details Name: ALEXUS MICHAEL MRN: [id number] DOB: 1960-03-15   Cancelled treatment:       Reason Eval/Treat Not Completed: SLP screened, no needs identified, will sign off   Juan Quam Laurice 09/27/2016, 2:03 PM

## 2016-09-27 NOTE — ED Triage Notes (Addendum)
Pt states LSN last night. States he woke up at 0530 and noticed the whole left side of his body was numb/tingling. No weakness, no difficulty with ambulation. No aphasia. VAN  negative.  Pt asked several times to clarify timeline. States at triage went to bed feeling fine and woke up at 530 with symptoms.

## 2016-09-27 NOTE — H&P (Signed)
Neurology H&P  CC: left-sided numbness  History is obtained from: patient  HPI: Todd Harris is a 56 y.o. male with a history of multiple cavernomas as well as seizures who presents with new left-sided numbness. He woke at 4 AM to go to the bathroom and was normal at that time. He then awoke at 5:30 AM with paresthesia of the left side.  LKW:  4 AM tpa given?: no, ICH ICH Score: 1 NIHSS: 1  ROS: A 14 point ROS was performed and is negative except as noted in the HPI.  Past Medical History:  Diagnosis Date  . Anxiety   . Atrial arrhythmia   . Barrett esophagus   . Bipolar 1 disorder ([place])   . Cavernous angioma   . Depression   . History of chicken pox   . Hypertension   . Seizures ([place])    1 in 2005, 1 in 2016   . Sleep apnea   . Sleep apnea   . Stroke ([place]) 11/2014   No residual deficits  . Venous malformation    Brain     Family History  Problem Relation Age of Onset  . Aneurysm Maternal Grandmother   . Asthma Mother   . Diabetes Mother   . Hypertension Mother   . Diabetes Brother   . Diabetes Brother   . Pancreatic cancer Father   . Diabetes Father   . Cancer Father        Pancreatic  . Diabetes Paternal Grandmother   . Diabetes Paternal Grandfather   . Colon cancer Neg Hx      Social History:  reports that he has never smoked. He has never used smokeless tobacco. He reports that he drinks alcohol. He reports that he does not use drugs.   Exam: Current vital signs: BP 113/81   Pulse 61   Temp 98.2 F (36.8 C)   Resp 10   Ht 5\' 10"  (1.778 m)   Wt 85 kg (187 lb 6.3 oz)   SpO2 97%   BMI 26.89 kg/m  Vital signs in last 24 hours: Temp:  [97.9 F (36.6 C)-98.2 F (36.8 C)] 98.2 F (36.8 C) (09/26 0741) Pulse Rate:  [61-75] 61 (09/26 0900) Resp:  [10-18] 10 (09/26 0900) BP: (113-148)/(81-101) 113/81 (09/26 0900) SpO2:  [97 %-100 %] 97 % (09/26 0900) Weight:  [85 kg (187 lb 6.3 oz)-86.1 kg (189 lb 14.4 oz)] 85 kg (187 lb 6.3 oz) (09/26  0825)  Physical Exam  Constitutional: Appears well-developed and well-nourished.  Psych: Affect appropriate to situation Eyes: No scleral injection HENT: No OP obstrucion Head: Normocephalic.  Cardiovascular: Normal rate and regular rhythm.  Respiratory: Effort normal and breath sounds normal to anterior ascultation GI: Soft.  No distension. There is no tenderness.  Skin: WDI  Neuro: Mental Status: Patient is awake, alert, oriented to person, place, month, year, and situation. Patient is able to give a clear and coherent history. No signs of aphasia or neglect Cranial Nerves: II: Visual Fields are full. Pupils are equal, round, and reactive to light.   III,IV, VI: EOMI without ptosis or diploplia.  V: Facial sensation with left-sided numbness VII: Facial movement is symmetric.  VIII: hearing is intact to voice X: Uvula elevates symmetrically XI: Shoulder shrug is symmetric. XII: tongue is midline without atrophy or fasciculations.  Motor: Tone is normal. Bulk is normal. 5/5 strength was present on the right side, possible mild weakness in the left without drift Sensory: Sensation is decreased throughout  the left side Cerebellar: FNF intact bilaterally   I have reviewed labs in epic and the results pertinent to this consultation are: CMP-unremarkable CBC-unremarkable  I have reviewed the images obtained: CT head-hypodensities lesion in the right pons  Impression: 56 year old male with multiple cavernomas with likley acute hemorrhage from this. With worsening deficits, I would favor admission to the ICU for close observation.   Recommendations: 1) Admit to ICU 2) no antiplatelets or anticoagulants 3) blood pressure control with goal systolic 390 - 300 4) Frequent neuro checks 5) If symptoms worsen or there is decreased mental status, repeat stat head CT 6) PT,OT,ST 7) MRI brain  This patient is critically ill and at significant risk of neurological worsening, death  and care requires constant monitoring of vital signs, hemodynamics,respiratory and cardiac monitoring, neurological assessment, discussion with family, other specialists and medical decision making of high complexity. I spent 50 minutes of neurocritical care time  in the care of  this patient.  Roland Rack, MD [facility] [phone]  If 7pm- 7am, please page neurology on call as listed in [place]. 09/27/2016  9:17 AM

## 2016-09-27 NOTE — Progress Notes (Signed)
Pt arrived to unit, reports increase loss of sensation on left side from head to toe.  No loss of strength.  MD came and assessed patient.  No new orders at this time.

## 2016-09-27 NOTE — ED Provider Notes (Signed)
[facility] DEPT Provider Note   CSN: [id number] Arrival date & time: 09/27/16  4098     History   Chief Complaint Numbness  HPI Todd Harris is a 56 y.o. male.  The history is provided by the patient.  He got up at 5:30 AM and noted numbness in the left side of his face, left arm, left leg. He denies headache and denies weakness. He does have history of multiple cavernous hemangiomata, and has had syncopal episodes related to this. Numbness seems to be getting worse. He states he has been walking normally and able to use his hand normally.  Past Medical History:  Diagnosis Date  . Anxiety   . Atrial arrhythmia   . Barrett esophagus   . Bipolar 1 disorder ([place])   . Depression   . History of chicken pox   . Hypertension   . Sleep apnea   . Sleep apnea     Patient Active Problem List   Diagnosis Date Noted  . Hemangioma of skin and subcutaneous tissue 07/07/2016  . Visit for preventive health examination 07/07/2016  . Prostate cancer screening 07/07/2016  . Obstructive sleep apnea 12/23/2015  . Arteriovenous malformation of brain 12/06/2015  . Cavernous hemangioma of brain ([place]) 12/30/2014  . Bipolar disorder ([place]) 12/30/2014  . HTN (hypertension) 12/30/2014    Past Surgical History:  Procedure Laterality Date  . APPENDECTOMY  1970   age 73  . bone spur     age 75  . kidney stones    . TONSILLECTOMY  1968       Home Medications    Prior to Admission medications   Medication Sig Start Date End Date Taking? Authorizing Provider  buPROPion (WELLBUTRIN XL) 150 MG 24 hr tablet Take 150 mg by mouth daily.    [provider]  diazepam (VALIUM) 5 MG tablet Take 5 mg by mouth 2 (two) times daily.  12/16/14   [provider]  divalproex (DEPAKOTE ER) 500 MG 24 hr tablet Take 3,000 mg by mouth daily.    [provider]  lisinopril-hydrochlorothiazide (PRINZIDE,ZESTORETIC) 20-25 MG tablet Take 1 tablet by mouth daily. 07/07/16   Brunetta Jeans, PA-C    Family History Family History  Problem Relation Age of Onset  . Aneurysm Maternal Grandmother   . Asthma Mother   . Diabetes Mother   . Hypertension Mother   . Diabetes Brother   . Diabetes Brother   . Pancreatic cancer Father   . Diabetes Father   . Cancer Father        Pancreatic  . Diabetes Paternal Grandmother   . Diabetes Paternal Grandfather   . Colon cancer Neg Hx     Social History Social History  Substance Use Topics  . Smoking status: Never Smoker  . Smokeless tobacco: Never Used  . Alcohol use 0.0 oz/week     Comment: 4-5 beers per week     Allergies   Patient has no known allergies.   Review of Systems Review of Systems  All other systems reviewed and are negative.    Physical Exam Updated Vital Signs BP (!) 148/101   Pulse 68   Temp 97.9 F (36.6 C) (Oral)   Resp 18   SpO2 98%   Physical Exam  Nursing note and vitals reviewed.  56 year old male, resting comfortably and in no acute distress. Vital signs are significant for hypertension. Oxygen saturation is 99%, which is normal. Head is normocephalic and atraumatic. PERRLA,  EOMI. Oropharynx is clear. Neck is nontender and supple without adenopathy or JVD. There are no carotid bruits. Back is nontender and there is no CVA tenderness. Lungs are clear without rales, wheezes, or rhonchi. Chest is nontender. Heart has regular rate and rhythm without murmur. Abdomen is soft, flat, nontender without masses or hepatosplenomegaly and peristalsis is normoactive. Extremities have no cyanosis or edema, full range of motion is present. Skin is warm and dry without rash. Neurologic: Mental status is normal, cranial nerves are intact, there are no motor. Deficits. There is moderate decrease sensation on left side of the body including all 3 divisions of the fifth cranial nerve. There is no pronator drift. There is no extinction on double simultaneous stimulation, but he does note that the  sensation in his left side is decreased compared with the right.   ED Treatments / Results  Labs (all labs ordered are listed, but only abnormal results are displayed) Labs Reviewed  COMPREHENSIVE METABOLIC PANEL - Abnormal; Notable for the following:       Result Value   Chloride 100 (*)    Glucose, Bld 107 (*)    Total Protein 6.3 (*)    All other components within normal limits  CBG MONITORING, ED - Abnormal; Notable for the following:    Glucose-Capillary 109 (*)    All other components within normal limits  I-STAT CHEM 8, ED - Abnormal; Notable for the following:    Glucose, Bld 108 (*)    Calcium, Ion 1.10 (*)    All other components within normal limits  MRSA PCR SCREENING  PROTIME-INR  APTT  CBC  DIFFERENTIAL  HIV ANTIBODY (ROUTINE TESTING)  RAPID URINE DRUG SCREEN, HOSP PERFORMED  I-STAT TROPONIN, ED    EKG  EKG Interpretation  Date/Time:  Wednesday September 27 2016 07:24:23 EDT Ventricular Rate:  73 PR Interval:    QRS Duration: 106 QT Interval:  395 QTC Calculation: 436 R Axis:   -14 Text Interpretation:  Sinus rhythm Atrial premature complex Abnormal R-wave progression, early transition When compared with ECG of 10/27/2003, HEART RATE has decreased Confirmed by Delora Fuel ([id number]) on 09/27/2016 7:35:27 AM       Radiology Ct Head Code Stroke Wo Contrast`  Result Date: 09/27/2016 CLINICAL DATA:  Code stroke. Left-sided weakness since 5:30 a.m., worsening. History of cavernous angiomas. EXAM: CT HEAD WITHOUT CONTRAST TECHNIQUE: Contiguous axial images were obtained from the base of the skull through the vertex without intravenous contrast. COMPARISON:  10/10/2013 FINDINGS: Brain: No evidence of acute infarct. There are multiple high-density masses within the infra and supratentorial brain consistent with patient's history of multiple cavernomas. The number of cavernomas is significantly underestimated relative to the brain MRI in 2010. There is a 11 mm  high-density fairly homogeneous abnormality in the right posterolateral pons that is new from 2015. No associated edema or mass effect. Other areas of chronic mineralization or blood products are seen in the biparietal white matter, superolateral to the atrium of the right lateral ventricle, in the peripheral left cerebellum. Brain volume is normal. Vascular: Arterial calcification.  No hyperdense vessel. Skull: Negative Sinuses/Orbits: Negative Other: These results were called by telephone at the time of interpretation on 09/27/2016 at 7:22 am to Dr. Leonel Ramsay, who verbally acknowledged these results. ASPECTS [facility] Stroke Program Early CT Score) Not scored in this setting. IMPRESSION: 1. Multiple cavernomas with interval, possibly acute, hemorrhage in the right pons measuring 11 mm. No mass effect or edema. 2. No ischemic findings. Electronically Signed  By: Monte Fantasia M.D.   On: 09/27/2016 07:27    Procedures Procedures (including critical care time)  CRITICAL CARE Performed by: GYJEH,UDJSH Total critical care time: 35 minutes Critical care time was exclusive of separately billable procedures and treating other patients. Critical care was necessary to treat or prevent imminent or life-threatening deterioration. Critical care was time spent personally by me on the following activities: development of treatment plan with patient and/or surrogate as well as nursing, discussions with consultants, evaluation of patient's response to treatment, examination of patient, obtaining history from patient or surrogate, ordering and performing treatments and interventions, ordering and review of laboratory studies, ordering and review of radiographic studies, pulse oximetry and re-evaluation of patient's condition.  Medications Ordered in ED Medications  acetaminophen (TYLENOL) tablet 650 mg (not administered)    Or  acetaminophen (TYLENOL) solution 650 mg (not administered)    Or  acetaminophen  (TYLENOL) suppository 650 mg (not administered)  senna-docusate (Senokot-S) tablet 1 tablet (1 tablet Oral Given 09/27/16 2113)  nicardipine (CARDENE) 20mg  in 0.86% saline 272ml IV infusion (0.1 mg/ml) (not administered)  Influenza vac split quadrivalent PF (FLUARIX) injection 0.5 mL (not administered)  pantoprazole (PROTONIX) EC tablet 40 mg (not administered)   stroke: mapping our early stages of recovery book ( Does not apply Given 09/27/16 0830)     Initial Impression / Assessment and Plan / ED Course  I have reviewed the triage vital signs and the nursing notes.  Pertinent labs & imaging results that were available during my care of the patient were reviewed by me and considered in my medical decision making (see chart for details).  Left-sided numbness consistent with stroke. Old records are reviewed confirming previous diagnosis of intracranial cavernous hemangiomata with areas of old hemorrhage seen on last MRI. Code stroke is activated, but he will not be a thrombolytic candidate. His deficit is not severe enough, and his intracranial hemangiomata would put him at increased risk of hemorrhagic complications.   CT scan shows bleed the right side of pons. This does correlate with his symptoms. Patient seen in conjunction with Dr. Leonel Ramsay of neurology service who agrees to admit the patient.  Final Clinical Impressions(s) / ED Diagnoses   Final diagnoses:  Intracranial bleed [facility])    New Prescriptions New Prescriptions   No medications on file     Delora Fuel, MD 70/26/37 [phone]

## 2016-09-27 NOTE — ED Notes (Signed)
Upon MD assessment pt now states he woke up at 0400 and felt fine.

## 2016-09-27 NOTE — ED Notes (Signed)
Pt states that he took his lisinopril this AM already.

## 2016-09-27 NOTE — ED Notes (Addendum)
Pt reports increased loss of sensation to left side of face, left arm, left leg. No other neuro deficits noted.

## 2016-09-27 NOTE — ED Notes (Signed)
Dr. Kirkpatrick at bedside 

## 2016-09-27 NOTE — ED Notes (Signed)
Yellow socks and band applied to pt.  

## 2016-09-28 ENCOUNTER — Inpatient Hospital Stay (HOSPITAL_COMMUNITY): Payer: 59

## 2016-09-28 DIAGNOSIS — Q283 Other malformations of cerebral vessels: Secondary | ICD-10-CM

## 2016-09-28 DIAGNOSIS — R569 Unspecified convulsions: Secondary | ICD-10-CM

## 2016-09-28 DIAGNOSIS — L570 Actinic keratosis: Secondary | ICD-10-CM

## 2016-09-28 LAB — HEMOGLOBIN A1C
Hgb A1c MFr Bld: 5.4 % (ref 4.8–5.6)
MEAN PLASMA GLUCOSE: 108.28 mg/dL

## 2016-09-28 LAB — HIV ANTIBODY (ROUTINE TESTING W REFLEX): HIV Screen 4th Generation wRfx: NONREACTIVE

## 2016-09-28 LAB — LIPID PANEL
Cholesterol: 176 mg/dL (ref 0–200)
HDL: 37 mg/dL — ABNORMAL LOW (ref >40)
LDL CALC: 104 mg/dL — AB (ref 0–99)
TRIGLYCERIDES: 176 mg/dL — AB (ref <150)
Total CHOL/HDL Ratio: 4.8 RATIO
VLDL: 35 mg/dL (ref 0–40)

## 2016-09-28 MED ORDER — PANTOPRAZOLE SODIUM 40 MG PO TBEC
40.0000 mg | DELAYED_RELEASE_TABLET | Freq: Every day | ORAL | Status: DC
  Administered 2016-09-29: 40 mg via ORAL
  Filled 2016-09-28: qty 1

## 2016-09-28 MED ORDER — IOPAMIDOL (ISOVUE-370) INJECTION 76%
INTRAVENOUS | Status: AC
  Administered 2016-09-28: 50 mL
  Filled 2016-09-28: qty 50

## 2016-09-28 MED ORDER — LABETALOL HCL 5 MG/ML IV SOLN: 5.0000 mg | INTRAVENOUS | Status: DC | PRN

## 2016-09-28 MED ORDER — BUPROPION HCL ER (XL) 150 MG PO TB24
150.0000 mg | ORAL_TABLET | Freq: Every day | ORAL | Status: DC
  Administered 2016-09-28 – 2016-09-29 (×2): 150 mg via ORAL
  Filled 2016-09-28 (×3): qty 1

## 2016-09-28 NOTE — Progress Notes (Signed)
OT Cancellation Note  Patient Details Name: Todd Harris MRN: [id number] DOB: [birth year]   Cancelled Treatment:    Reason Eval/Treat Not Completed: Patient not medically ready. Pt on bedrest. Please update activity orders when appropirate to begin therapy. Thanks.  [place], OT/L  [phone] 09/28/2016 09/28/2016, 7:57 AM

## 2016-09-28 NOTE — Evaluation (Signed)
Physical Therapy Evaluation Patient Details Name: Todd Harris MRN: [id number] DOB: Mar 23, 1960 Today's Date: 09/28/2016   History of Present Illness  Todd Harris is a 56 y.o. male with a history of multiple cavernomas as well as seizures who presents with new left-sided numbness.  Found to have large Todd Harris R hemipons.  PMH positive for Bipolar disorder, Barrett's esophogus, Cavernous angioma, HTN, depression, sleep apnea.   Clinical Impression  Patient presents with decreased mobility due to decreased sensation L LE/hemibody.  Feel he will benefit from skilled PT in the acute setting to allow return home with family support and follow up outpatient PT.  Currently minguard to S level for mobility, but fall risk due to decreased sensation and slight weakness so currently appropriate for follow up PT, will follow along for continued appropriateness.      Follow Up Recommendations Outpatient PT    Equipment Recommendations  Other (comment) (possibly cane, TBA)    Recommendations for Other Services       Precautions / Restrictions Precautions Precautions: Fall      Mobility  Bed Mobility Overal bed mobility: Needs Assistance             General bed mobility comments: up in chair  Transfers Overall transfer level: Needs assistance Equipment used: Rolling walker (2 wheeled) Transfers: Sit to/from Stand Sit to Stand: Min guard         General transfer comment: cues for hand placement  Ambulation/Gait Ambulation/Gait assistance: Supervision;Min guard;Min assist Ambulation Distance (Feet): 250 Feet Assistive device: Rolling walker (2 wheeled);None Gait Pattern/deviations: Step-through pattern;Decreased stride length;Shuffle     General Gait Details: with walker around most of unit, then without walker last 70' min A some decreased stride length, but no significant issues with balance, "L foot feels funny"  Cues to walk without thinking about it.   Stairs             Wheelchair Mobility    Modified Rankin (Stroke Patients Only) Modified Rankin (Stroke Patients Only) Pre-Morbid Rankin Score: No symptoms Modified Rankin: Moderately severe disability     Balance Overall balance assessment: Needs assistance   Sitting balance-Leahy Scale: Good     Standing balance support: No upper extremity supported Standing balance-Leahy Scale: Fair                               Pertinent Vitals/Pain Pain Assessment: Faces Faces Pain Scale: Hurts a little bit Pain Location: tingling on L side Pain Descriptors / Indicators: Discomfort Pain Intervention(s): Monitored during session    Home Living Family/patient expects to be discharged to:: Private residence Living Arrangements: Spouse/significant other Available Help at Discharge: Family Type of Home: House Home Access: Stairs to enter   Technical brewer of Steps: 2 Home Layout: Two level;Bed/bath upstairs Home Equipment: None      Prior Function Level of Independence: Independent               Hand Dominance        Extremity/Trunk Assessment   Upper Extremity Assessment Upper Extremity Assessment: LUE deficits/detail LUE Deficits / Details: WFL AROM and strength LUE Sensation: decreased light touch (intact proprioception/stereognosis)    Lower Extremity Assessment Lower Extremity Assessment: LLE deficits/detail LLE Deficits / Details: WFL AROM, strength 4+/5 pt felt cramping some with testing LLE Sensation: decreased light touch (intact stereognosis/proprioception)       Communication   Communication: No difficulties  Cognition Arousal/Alertness: Awake/alert  Behavior During Therapy: WFL for tasks assessed/performed Overall Cognitive Status: Within Functional Limits for tasks assessed                                        General Comments      Exercises     Assessment/Plan    PT Assessment Patient needs continued PT services   PT Problem List Decreased balance;Impaired sensation;Decreased knowledge of use of DME;Decreased mobility       PT Treatment Interventions DME instruction;Gait training;Therapeutic exercise;Patient/family education;Therapeutic activities;Balance training;Functional mobility training;Stair training;Neuromuscular re-education    PT Goals (Current goals can be found in the Care Plan section)  Acute Rehab PT Goals Patient Stated Goal: To return to normal PT Goal Formulation: With patient Time For Goal Achievement: 10/05/16 Potential to Achieve Goals: Good    Frequency Min 4X/week   Barriers to discharge        Co-evaluation               AM-PAC PT "6 Clicks" Daily Activity  Outcome Measure Difficulty turning over in bed (including adjusting bedclothes, sheets and blankets)?: None Difficulty moving from lying on back to sitting on the side of the bed? : None Difficulty sitting down on and standing up from a chair with arms (e.g., wheelchair, bedside commode, etc,.)?: Unable Help needed moving to and from a bed to chair (including a wheelchair)?: A Little Help needed walking in hospital room?: A Little Help needed climbing 3-5 steps with a railing? : A Little 6 Click Score: 18    End of Session Equipment Utilized During Treatment: Gait belt Activity Tolerance: Patient tolerated treatment well Patient left: in chair;with call bell/phone within reach        Time: 2836-6294 PT Time Calculation (min) (ACUTE ONLY): 19 min   Charges:   PT Evaluation $PT Eval Moderate Complexity: 1 Mod     PT G CodesMagda Harris, [place] [phone] 09/28/2016   Todd Harris 09/28/2016, 1:27 PM
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
## 2016-09-29 ENCOUNTER — Inpatient Hospital Stay (HOSPITAL_COMMUNITY): Payer: 59

## 2016-09-29 DIAGNOSIS — I6789 Other cerebrovascular disease: Secondary | ICD-10-CM

## 2016-09-29 DIAGNOSIS — R569 Unspecified convulsions: Secondary | ICD-10-CM

## 2016-09-29 LAB — CBC
HEMATOCRIT: 44.3 % (ref 39.0–52.0)
HEMOGLOBIN: 14.6 g/dL (ref 13.0–17.0)
MCH: 29.4 pg (ref 26.0–34.0)
MCHC: 33 g/dL (ref 30.0–36.0)
MCV: 89.3 fL (ref 78.0–100.0)
Platelets: 184 10*3/uL (ref 150–400)
RBC: 4.96 MIL/uL (ref 4.22–5.81)
RDW: 13 % (ref 11.5–15.5)
WBC: 10.8 10*3/uL — ABNORMAL HIGH (ref 4.0–10.5)

## 2016-09-29 LAB — ECHOCARDIOGRAM COMPLETE
AOASC: 30 cm
CHL CUP DOP CALC LVOT VTI: 23.5 cm
CHL CUP MV DEC (S): 236
E decel time: 236 msec
E/e' ratio: 11.35
FS: 45 % — AB (ref 28–44)
HEIGHTINCHES: 70 in
IV/PV OW: 0.91
LA vol: 31.4 mL
LADIAMINDEX: 1.58 cm/m2
LASIZE: 32 mm
LAVOLA4C: 30.6 mL
LAVOLIN: 15.5 mL/m2
LEFT ATRIUM END SYS DIAM: 32 mm
LV E/e'average: 11.35
LV TDI E'LATERAL: 8.38
LV dias vol index: 31 mL/m2
LV sys vol index: 8 mL/m2
LV sys vol: 17 mL — AB (ref 21–61)
LVDIAVOL: 63 mL (ref 62–150)
LVEEMED: 11.35
LVELAT: 8.38 cm/s
LVOT area: 3.14 cm2
LVOT diameter: 20 mm
LVOT peak grad rest: 6 mmHg
LVOTPV: 122 cm/s
LVOTSV: 74 mL
Lateral S' vel: 12.5 cm/s
MVPG: 4 mmHg
MVPKAVEL: 92.5 m/s
MVPKEVEL: 95.1 m/s
PW: 12.6 mm — AB (ref 0.6–1.1)
Simpson's disk: 73
Stroke v: 46 ml
TAPSE: 23.6 mm
TDI e' medial: 8.38
Weight: 2998.26 oz

## 2016-09-29 LAB — BASIC METABOLIC PANEL
Anion gap: 9 (ref 5–15)
BUN: 22 mg/dL — ABNORMAL HIGH (ref 6–20)
CHLORIDE: 104 mmol/L (ref 101–111)
CO2: 28 mmol/L (ref 22–32)
Calcium: 9.5 mg/dL (ref 8.9–10.3)
Creatinine, Ser: 1 mg/dL (ref 0.61–1.24)
GFR calc Af Amer: >60 mL/min (ref >60)
GFR calc non Af Amer: >60 mL/min (ref >60)
Glucose, Bld: 111 mg/dL — ABNORMAL HIGH (ref 65–99)
POTASSIUM: 4 mmol/L (ref 3.5–5.1)
SODIUM: 141 mmol/L (ref 135–145)

## 2016-09-29 MED ORDER — HYDROCHLOROTHIAZIDE 25 MG PO TABS
25.0000 mg | ORAL_TABLET | Freq: Every day | ORAL | Status: DC
  Administered 2016-09-30: 25 mg via ORAL
  Filled 2016-09-29 (×2): qty 1

## 2016-09-29 MED ORDER — ONDANSETRON HCL 4 MG/2ML IJ SOLN
4.0000 mg | Freq: Four times a day (QID) | INTRAMUSCULAR | Status: DC | PRN
  Administered 2016-09-29 – 2016-09-30 (×2): 4 mg via INTRAVENOUS
  Filled 2016-09-29 (×2): qty 2

## 2016-09-29 MED ORDER — LISINOPRIL-HYDROCHLOROTHIAZIDE 20-25 MG PO TABS: 1.0000 | ORAL_TABLET | Freq: Every day | ORAL | Status: DC

## 2016-09-29 MED ORDER — DIAZEPAM 5 MG PO TABS
5.0000 mg | ORAL_TABLET | Freq: Every day | ORAL | Status: DC
Start: 2016-09-29 — End: 2016-09-30
  Administered 2016-09-30: 5 mg via ORAL
  Filled 2016-09-29: qty 1

## 2016-09-29 MED ORDER — LISINOPRIL 20 MG PO TABS
20.0000 mg | ORAL_TABLET | Freq: Every day | ORAL | Status: DC
  Administered 2016-09-30: 20 mg via ORAL
  Filled 2016-09-29 (×2): qty 1
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
## 2016-09-29 NOTE — Procedures (Signed)
ELECTROENCEPHALOGRAM REPORT  Date of Study: 09/29/2016  Patient's Name: Todd Harris MRN: [id number] Date of Birth: 01-11-1960  Referring Provider: Dr. Rosalin Hawking  Clinical History: This is a 56 year old man with left-sided numbness.  Medications: acetaminophen (TYLENOL) tablet 650 mg  buPROPion (WELLBUTRIN XL) 24 hr tablet 150 mg  labetalol (NORMODYNE,TRANDATE) injection 5-10 mg  pantoprazole (PROTONIX) EC tablet 40 mg  senna-docusate (Senokot-S) tablet 1 tablet   Technical Summary: A multichannel digital EEG recording measured by the international 10-20 system with electrodes applied with paste and impedances below 5000 ohms performed in our laboratory with EKG monitoring in an awake and drowsy patient.  Hyperventilation and photic stimulation were not performed.  The digital EEG was referentially recorded, reformatted, and digitally filtered in a variety of bipolar and referential montages for optimal display.    Description: The patient is awake and drowsy during the recording.  During maximal wakefulness, there is a symmetric, medium voltage 10 Hz posterior dominant rhythm that attenuates with eye opening.  The record is symmetric.  Sleep was not captured. Hyperventilation and photic stimulation were not performed.  There were no epileptiform discharges or electrographic seizures seen.    EKG lead was unremarkable.  Impression: This awake and drowsy EEG is normal.    Clinical Correlation: A normal EEG does not exclude a clinical diagnosis of epilepsy. Clinical correlation is advised.   Ellouise Newer, M.D.

## 2016-09-29 NOTE — Progress Notes (Signed)
  Echocardiogram 2D Echocardiogram has been performed.  Todd Harris 09/29/2016, 1:33 PM

## 2016-09-29 NOTE — Progress Notes (Signed)
Physical Therapy Treatment Patient Details Name: Todd Harris MRN: [id number] DOB: [birth year] Today's Date: 09/29/2016    History of Present Illness Todd Harris is a 56 y.o. male with a history of multiple cavernomas as well as seizures who presents with new left-sided numbness.  Found to have large [place] R hemipons.  PMH positive for Bipolar disorder, Barrett's esophogus, Cavernous angioma, HTN, depression, sleep apnea.     PT Comments    Pt with improved gait and able to perform long hall ambulation without AD with LOB with right head turns. Pt performed high level balance activities with LOB with narrowed BOS and discussed safety and daily implications of falls with this. Pt very positive and eager to progress. OPPT remains appropriate. Will follow. Pt reports dizziness but BP stable and not symptomatic of vestibular dysfunction.  BP 124/90 before 118/98 after gait    Follow Up Recommendations  Outpatient PT     Equipment Recommendations  None recommended by PT    Recommendations for Other Services       Precautions / Restrictions Precautions Precautions: Fall Restrictions Weight Bearing Restrictions: No    Mobility  Bed Mobility Overal bed mobility: Needs Assistance Bed Mobility: Supine to Sit     Supine to sit: Supervision     General bed mobility comments: in chair on arrival   Transfers Overall transfer level: Needs assistance Equipment used: None Transfers: Sit to/from Stand Sit to Stand: Supervision         General transfer comment: supervision for safety and lines  Ambulation/Gait Ambulation/Gait assistance: Min guard Ambulation Distance (Feet): 900 Feet Assistive device: None Gait Pattern/deviations: Step-through pattern;Decreased stance time - left   Gait velocity interpretation: Below normal speed for age/gender General Gait Details: pt with increased knee flexion and heel strike on LLE as pt states he feels like he is dragging it. pt with  steady gait with forward head no change of speed. Safe with speed changes but LOB with right head turn   Stairs Stairs: Yes   Stair Management: Step to pattern;Alternating pattern;One rail Right Number of Stairs: 14 General stair comments: pt able to ascend with alternating pattern and descend with step to pattern with cues for sequence to step down with LLE  Wheelchair Mobility    Modified Rankin (Stroke Patients Only) Modified Rankin (Stroke Patients Only) Pre-Morbid Rankin Score: No symptoms Modified Rankin: Moderately severe disability     Balance Overall balance assessment: Needs assistance Sitting-balance support: Feet supported;No upper extremity supported Sitting balance-Leahy Scale: Good     Standing balance support: No upper extremity supported;During functional activity Standing balance-Leahy Scale: Good       Tandem Stance - Right Leg: 0 Tandem Stance - Left Leg: 0 Rhomberg - Eyes Opened: 60 Rhomberg - Eyes Closed: 20   High Level Balance Comments: pt able to perform left head turn and vertical change without LOB, LOB with looking right, pt with LOB and needing to reach for support with narrowed BOS in tandem stance and after 2 trials able to maintain eyes closed rhomberg for 20 sec            Cognition Arousal/Alertness: Awake/alert Behavior During Therapy: WFL for tasks assessed/performed Overall Cognitive Status: Within Functional Limits for tasks assessed                                        Exercises  General Comments        Pertinent Vitals/Pain Pain Assessment: No/denies pain    Home Living Family/patient expects to be discharged to:: Private residence Living Arrangements: Spouse/significant other Available Help at Discharge: Family Type of Home: House Home Access: Stairs to enter   Home Layout: Two level;Bed/bath upstairs Home Equipment: Hand held shower head      Prior Function Level of Independence:  Independent      Comments: has ~20-30 minute commute to work   PT Goals (current goals can now be found in the care plan section) Acute Rehab PT Goals Patient Stated Goal: To return to normal Progress towards PT goals: Progressing toward goals    Frequency    Min 4X/week      PT Plan Current plan remains appropriate    Co-evaluation              AM-PAC PT "6 Clicks" Daily Activity  Outcome Measure  Difficulty turning over in bed (including adjusting bedclothes, sheets and blankets)?: None Difficulty moving from lying on back to sitting on the side of the bed? : None Difficulty sitting down on and standing up from a chair with arms (e.g., wheelchair, bedside commode, etc,.)?: A Little Help needed moving to and from a bed to chair (including a wheelchair)?: A Little Help needed walking in hospital room?: A Little Help needed climbing 3-5 steps with a railing? : A Little 6 Click Score: 20    End of Session Equipment Utilized During Treatment: Gait belt Activity Tolerance: Patient tolerated treatment well Patient left: in chair;with call bell/phone within reach Nurse Communication: Mobility status       Time: 8811-0315 PT Time Calculation (min) (ACUTE ONLY): 27 min  Charges:  $Gait Training: 8-22 mins $Neuromuscular Re-education: 8-22 mins                    G Codes:       Elwyn Reach, PT [phone]   [place] 09/29/2016, 1:21 PM

## 2016-09-29 NOTE — Progress Notes (Signed)
EEG complete. Results pending.  ?

## 2016-09-29 NOTE — Evaluation (Signed)
Occupational Therapy Evaluation Patient Details Name: Todd Harris MRN: [id number] DOB: 08-Jul-1960 Today's Date: 09/29/2016    History of Present Illness Todd Harris is a 56 y.o. male with a history of multiple cavernomas as well as seizures who presents with new left-sided numbness.  Found to have large [place] R hemipons.  PMH positive for Bipolar disorder, Barrett's esophogus, Cavernous angioma, HTN, depression, sleep apnea.    Clinical Impression   Pt reports he was independent with ADL and mobility PTA and was working a full time job in [employer], technical [employer]. Currently pt requires min guard assist for functional mobility and ADL due to c/o dizziness and L sided numbness. Began education on safety with sensation changes in LUE and sensory reeducation techniques. Encouraged functional use of LUE. Pt planning to d/c home with supervision from family. Recommending Neuro Outpatient OT for follow up to maximize return to functional and work activities. Pt would benefit from continued skilled OT to address established goals.    Follow Up Recommendations  Outpatient OT (Neuro)    Equipment Recommendations  3 in 1 bedside commode (to use as shower chair)    Recommendations for Other Services       Precautions / Restrictions Precautions Precautions: Fall Restrictions Weight Bearing Restrictions: No      Mobility Bed Mobility Overal bed mobility: Needs Assistance Bed Mobility: Supine to Sit     Supine to sit: Supervision     General bed mobility comments: Increased time and effort, no physical assist  Transfers Overall transfer level: Needs assistance Equipment used: None Transfers: Sit to/from Stand Sit to Stand: Min guard         General transfer comment: for safety. increased time required due to dizziness    Balance Overall balance assessment: Needs assistance Sitting-balance support: Feet supported;No upper extremity supported Sitting balance-Leahy Scale: Good     Standing balance  support: No upper extremity supported;During functional activity Standing balance-Leahy Scale: Fair                             ADL either performed or assessed with clinical judgement   ADL Overall ADL's : Needs assistance/impaired Eating/Feeding: Set up;Sitting   Grooming: Min guard;Standing;Oral care   Upper Body Bathing: Set up;Supervision/ safety;Sitting   Lower Body Bathing: Min guard;Sit to/from stand   Upper Body Dressing : Set up;Supervision/safety;Sitting   Lower Body Dressing: Min guard;Sit to/from stand   Toilet Transfer: Min guard;Ambulation;Comfort height toilet           Functional mobility during ADLs: Min guard General ADL Comments: Educated pt on protecting LUE due to sensation changes-being careful with hot/cold and sharp objects. Educated on sensory reeducation strategies-massage and rubbing different textures. Encouraged functional use of LUE. Pt c/o dizziness throuhgout session; VSS throughout.     Vision Baseline Vision/History: Wears glasses Wears Glasses: At all times Patient Visual Report: No change from baseline Vision Assessment?: No apparent visual deficits     Perception     Praxis      Pertinent Vitals/Pain Pain Assessment: No/denies pain     Hand Dominance Right   Extremity/Trunk Assessment Upper Extremity Assessment Upper Extremity Assessment: LUE deficits/detail LUE Deficits / Details: WFL AROM and strength. Reporting numbness throughout LUE Sensation: decreased light touch   Lower Extremity Assessment Lower Extremity Assessment: Defer to PT evaluation   Cervical / Trunk Assessment Cervical / Trunk Assessment: Normal   Communication Communication Communication: No difficulties   Cognition  Arousal/Alertness: Awake/alert Behavior During Therapy: WFL for tasks assessed/performed Overall Cognitive Status: Within Functional Limits for tasks assessed                                     General  Comments       Exercises     Shoulder Instructions      Home Living Family/patient expects to be discharged to:: Private residence Living Arrangements: Spouse/significant other Available Help at Discharge: Family Type of Home: House Home Access: Stairs to enter Technical brewer of Steps: 2   Home Layout: Two level;Bed/bath upstairs Alternate Level Stairs-Number of Steps: 14   Bathroom Shower/Tub: Occupational psychologist: Standard     Home Equipment: Hand held shower head          Prior Functioning/Environment Level of Independence: Independent        Comments: has ~20-30 minute commute to work        OT Problem List: Impaired balance (sitting and/or standing);Decreased knowledge of use of DME or AE;Decreased knowledge of precautions;Impaired sensation      OT Treatment/Interventions: Self-care/ADL training;Therapeutic exercise;Neuromuscular education;Energy conservation;DME and/or AE instruction;Therapeutic activities;Patient/family education;Balance training    OT Goals(Current goals can be found in the care plan section) Acute Rehab OT Goals Patient Stated Goal: To return to normal OT Goal Formulation: With patient Time For Goal Achievement: 10/13/16 Potential to Achieve Goals: Good ADL Goals Pt Will Perform Tub/Shower Transfer: Shower transfer;with modified independence;ambulating;3 in 1 Additional ADL Goal #1: Pt will independently recall 3 sensory reeducation strategies and perfrom to LUE. Additional ADL Goal #2: Pt will gather ADL items and perform ADL with mod I.  OT Frequency: Min 2X/week   Barriers to D/C:            Co-evaluation              AM-PAC PT "6 Clicks" Daily Activity     Outcome Measure Help from another person eating meals?: None Help from another person taking care of personal grooming?: A Little Help from another person toileting, which includes using toliet, bedpan, or urinal?: A Little Help from another  person bathing (including washing, rinsing, drying)?: A Little Help from another person to put on and taking off regular upper body clothing?: A Little Help from another person to put on and taking off regular lower body clothing?: A Little 6 Click Score: 19   End of Session    Activity Tolerance: Patient tolerated treatment well Patient left: in chair;with call bell/phone within reach;with family/visitor present  OT Visit Diagnosis: Unsteadiness on feet (R26.81);Dizziness and giddiness (R42)                Time: 4967-5916 OT Time Calculation (min): 23 min Charges:  OT General Charges $OT Visit: 1 Visit OT Evaluation $OT Eval Moderate Complexity: 1 Mod OT Treatments $Self Care/Home Management : 8-22 mins G-Codes:     Tamella Tuccillo A. Ulice Brilliant, M.S., OTR/L Pager: [place] 09/29/2016, 11:39 AM

## 2016-09-30 ENCOUNTER — Other Ambulatory Visit: Payer: Self-pay | Admitting: Neurology

## 2016-09-30 DIAGNOSIS — D1800 Hemangioma unspecified site: Secondary | ICD-10-CM

## 2016-09-30 DIAGNOSIS — Q283 Other malformations of cerebral vessels: Secondary | ICD-10-CM

## 2016-09-30 LAB — CBC
HCT: 42.1 % (ref 39.0–52.0)
Hemoglobin: 14 g/dL (ref 13.0–17.0)
MCH: 29.5 pg (ref 26.0–34.0)
MCHC: 33.3 g/dL (ref 30.0–36.0)
MCV: 88.8 fL (ref 78.0–100.0)
PLATELETS: 174 10*3/uL (ref 150–400)
RBC: 4.74 MIL/uL (ref 4.22–5.81)
RDW: 12.9 % (ref 11.5–15.5)
WBC: 8.5 10*3/uL (ref 4.0–10.5)

## 2016-09-30 LAB — BASIC METABOLIC PANEL
Anion gap: 8 (ref 5–15)
BUN: 22 mg/dL — AB (ref 6–20)
CALCIUM: 9.2 mg/dL (ref 8.9–10.3)
CHLORIDE: 105 mmol/L (ref 101–111)
CO2: 26 mmol/L (ref 22–32)
CREATININE: 0.91 mg/dL (ref 0.61–1.24)
GFR calc Af Amer: >60 mL/min (ref >60)
Glucose, Bld: 100 mg/dL — ABNORMAL HIGH (ref 65–99)
Potassium: 3.8 mmol/L (ref 3.5–5.1)
SODIUM: 139 mmol/L (ref 135–145)

## 2016-09-30 MED ORDER — MECLIZINE HCL 25 MG PO TABS
25.0000 mg | ORAL_TABLET | Freq: Three times a day (TID) | ORAL | Status: DC | PRN
  Administered 2016-09-30: 25 mg via ORAL
  Filled 2016-09-30: qty 1

## 2016-09-30 MED ORDER — MECLIZINE HCL 25 MG PO TABS: 25.0000 mg | ORAL_TABLET | Freq: Three times a day (TID) | ORAL | 0 refills | Status: DC | PRN
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
## 2016-09-30 NOTE — Progress Notes (Signed)
Occupational Therapy Treatment Patient Details Name: Todd Harris MRN: [id number] DOB: Dec 25, 1960 Today's Date: 09/30/2016    History of present illness Todd Harris is a 56 y.o. male with a history of multiple cavernomas as well as seizures who presents with new left-sided numbness.  Found to have large [place] R hemipons.  PMH positive for Bipolar disorder, Barrett's esophogus, Cavernous angioma, HTN, depression, sleep apnea.    OT comments  Pt progressing towards established OT goals. Provided education and handout on sensory re-education of LUE, and pt performed sensory re-education exercises. Pt continues to be limited by dizziness impacting his functional performance of ADLs. Pt performed grooming at a supervision level for safety due to dizziness. Pt attempted LB dressing but became nauseas, flush, and diaphoretic. Pt requested to return to supine in bed; BP 106/66. Continues to recommend dc to outpatient neuro clinic for further OT to optimize return to PLOF.     Follow Up Recommendations  Outpatient OT (Neuro)    Equipment Recommendations  3 in 1 bedside commode (to use as shower chair)    Recommendations for Other Services      Precautions / Restrictions Precautions Precautions: Fall Restrictions Weight Bearing Restrictions: No       Mobility Bed Mobility Overal bed mobility: Needs Assistance Bed Mobility: Supine to Sit     Supine to sit: Supervision     General bed mobility comments: HOB elevated and supervision for safety secondary to dizziness  Transfers Overall transfer level: Needs assistance Equipment used: None Transfers: Sit to/from Stand Sit to Stand: Supervision         General transfer comment: supervision for safety    Balance Overall balance assessment: Needs assistance Sitting-balance support: Feet supported;No upper extremity supported Sitting balance-Leahy Scale: Good     Standing balance support: No upper extremity supported;During  functional activity Standing balance-Leahy Scale: Good                             ADL either performed or assessed with clinical judgement   ADL Overall ADL's : Needs assistance/impaired   Eating/Feeding Details (indicate cue type and reason): Discussed pocketing food in cheeks due to decreased sensation Grooming: Min guard;Standing;Oral care Grooming Details (indicate cue type and reason): Pt performed grooming task. Discussed WBing through LUE to increase proproceptive input.              Lower Body Dressing: Min guard;Sit to/from stand Lower Body Dressing Details (indicate cue type and reason): Initiated LB dressing in preparation for dc today. Pt became nausea and stated he needed to return to bed. Pt flush and clammy. Provided cold wash cloth. BP 106/66 and HR 75             Functional mobility during ADLs: Min guard;Rolling walker General ADL Comments: Pt functional performance limited by dizziness and nausea. Pt reporting dizziness with each change in position. Min VCs for pt to use compensatory to decrease dizziness (such as staring at a focus point). Provided pt with education and handout on sensory reeducation on LUE.     Vision   Vision Assessment?: No apparent visual deficits   Perception     Praxis      Cognition Arousal/Alertness: Awake/alert Behavior During Therapy: WFL for tasks assessed/performed Overall Cognitive Status: Within Functional Limits for tasks assessed  Exercises Exercises: Other exercises Other Exercises Other Exercises: Provided pt with handout on sensory re-education. Reviewed and pt verablized understanding. Other Exercises: Steriognosis exercise with pt feeling object in hand to identify them. 5 reps Other Exercises: Educated on massage and rubbing different textures Other Exercises: Hot/cold cloth to increase sensation of tempuratures. Educated pt on safety for  hot objects   Shoulder Instructions       General Comments Wife present throughout    Pertinent Vitals/ Pain       Pain Assessment: Faces Faces Pain Scale: Hurts a little bit Pain Location: tingling on L side Pain Descriptors / Indicators: Discomfort Pain Intervention(s): Monitored during session;Repositioned  Home Living                                          Prior Functioning/Environment              Frequency  Min 2X/week        Progress Toward Goals  OT Goals(current goals can now be found in the care plan section)  Progress towards OT goals: Progressing toward goals  Acute Rehab OT Goals Patient Stated Goal: To return to normal OT Goal Formulation: With patient Time For Goal Achievement: 10/13/16 Potential to Achieve Goals: Good ADL Goals Pt Will Perform Tub/Shower Transfer: Shower transfer;with modified independence;ambulating;3 in 1 Additional ADL Goal #1: Pt will independently recall 3 sensory reeducation strategies and perfrom to LUE. Additional ADL Goal #2: Pt will gather ADL items and perform ADL with mod I.  Plan Discharge plan remains appropriate    Co-evaluation                 AM-PAC PT "6 Clicks" Daily Activity     Outcome Measure   Help from another person eating meals?: None Help from another person taking care of personal grooming?: A Little Help from another person toileting, which includes using toliet, bedpan, or urinal?: A Little Help from another person bathing (including washing, rinsing, drying)?: A Little Help from another person to put on and taking off regular upper body clothing?: A Little Help from another person to put on and taking off regular lower body clothing?: A Little 6 Click Score: 19    End of Session Equipment Utilized During Treatment: Gait belt;Rolling walker  OT Visit Diagnosis: Unsteadiness on feet (R26.81);Dizziness and giddiness (R42)   Activity Tolerance Patient tolerated  treatment well   Patient Left with call bell/phone within reach;with family/visitor present;in bed   Nurse Communication Mobility status;Other (comment) (dizziness and BP)        Time: 7416-3845 OT Time Calculation (min): 34 min  Charges: OT General Charges $OT Visit: 1 Visit OT Treatments $Self Care/Home Management : 8-22 mins $Therapeutic Activity: 8-22 mins  Van, OTR/L Acute Rehab Pager: [phone] Office: [place] 09/30/2016, 4:23 PM
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
## 2016-09-30 NOTE — Progress Notes (Signed)
Patient states he takes his valium, lisinopril and HZTZ every morning at 0600. Refused his 2200 meds. I sent notices on all three to pharmacy for administration time to be changed.

## 2016-10-02 ENCOUNTER — Ambulatory Visit (INDEPENDENT_AMBULATORY_CARE_PROVIDER_SITE_OTHER): Payer: 59 | Admitting: Physician Assistant

## 2016-10-02 ENCOUNTER — Encounter: Payer: Self-pay | Admitting: Physician Assistant

## 2016-10-02 ENCOUNTER — Telehealth: Payer: Self-pay | Admitting: Neurology

## 2016-10-02 VITALS — BP 110/78 | HR 59 | Temp 98.6°F | Resp 14 | Ht 69.0 in | Wt 187.0 lb

## 2016-10-02 DIAGNOSIS — I613 Nontraumatic intracerebral hemorrhage in brain stem: Secondary | ICD-10-CM

## 2016-10-02 NOTE — Progress Notes (Signed)
Pre visit review using our clinic review tool, if applicable. No additional management support is needed unless otherwise documented below in the visit note. 

## 2016-10-02 NOTE — Progress Notes (Signed)
Patient presents to clinic today for hospital follow-up. Patient presented to ER on 09/27/2016 with acute onset of left-sided paresthesias. Patient with history of multiple cavernous hemangiomas. Stat CT obtained revealing acute small cavernoma hemorrhage in R pons with multiple stable cavernomas noted. Patient admitted for further assessment and management. Hospital workup-included MRI Head confirming late acute hemorrhage in R pontine. Patient has serial CT Heads revealing hemorrhage had finally stabilized. CTA head and neck performed and unremarkable. TTE obtained revealing normal systolic and diastolic function with EF at 55-60%. Normal EEG study. Patient stabilized and discharge home for OP follow-up her in primary care and with Neurology (Dr. Jaynee Eagles).   Since discharge, patient endorses doing well overall. Is ambulating without much difficulty. Using a walker most of the time. Denies any issue with motor function. Denies change to bladder habits. Notes mild constipation but has not been hydrating well. Patient denies chest pain, palpitations, lightheadedness, dizziness, vision changes or frequent headaches. Is still having numbness of left face, upper and lower extremities. Is able to feel pressure but not sharp sensations. Denies new or worsening symptoms. Has not heard yet from Neurology for appointment and rehabilitation.   Past Medical History:  Diagnosis Date  . Anxiety   . Atrial arrhythmia   . Barrett esophagus   . Bipolar 1 disorder ([place])   . Cavernous angioma   . Depression   . History of chicken pox   . Hypertension   . Seizures ([place])    1 in 2005, 1 in 2016   . Sleep apnea   . Sleep apnea   . Stroke ([place]) 11/2014   No residual deficits  . Venous malformation    Brain    Current Outpatient Prescriptions on File Prior to Visit  Medication Sig Dispense Refill  . buPROPion (WELLBUTRIN XL) 150 MG 24 hr tablet Take 150 mg by mouth daily.    . diazepam (VALIUM) 5 MG tablet Take  5 mg by mouth daily.     Marland Kitchen lisinopril-hydrochlorothiazide (PRINZIDE,ZESTORETIC) 20-25 MG tablet Take 1 tablet by mouth daily. 90 tablet 1  . meclizine (ANTIVERT) 25 MG tablet Take 1 tablet (25 mg total) by mouth 3 (three) times daily as needed for dizziness. 30 tablet 0   No current facility-administered medications on file prior to visit.     No Known Allergies  Family History  Problem Relation Age of Onset  . Aneurysm Maternal Grandmother   . Asthma Mother   . Diabetes Mother   . Hypertension Mother   . Diabetes Brother   . Diabetes Brother   . Pancreatic cancer Father   . Diabetes Father   . Cancer Father        Pancreatic  . Diabetes Paternal Grandmother   . Diabetes Paternal Grandfather   . Colon cancer Neg Hx     Social History   Social History  . Marital status: Married    Spouse name: [name]   . Number of children: 3  . Years of education: 16+   Occupational History  . 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 Smoking status: Never Smoker  . Smokeless tobacco: Never Used  . Alcohol use 0.0 oz/week     Comment: 4-5 beers per week  . Drug use: No  . Sexual activity: Yes   Other Topics Concern  . None   Social History Narrative   Lives in [place].    Married. 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      Caffeine use: 2 cup coffee per day      Review of Systems - See HPI.  All other ROS are negative.  BP 110/78   Pulse (!) 59   Temp 98.6 F (37 C) (Oral)   Resp 14   Ht 5' 9"  (1.753 m)   Wt 187 lb (84.8 kg)   SpO2 99%   BMI 27.62 kg/m   Physical Exam  Constitutional: He is oriented to person, place, and time and well-developed, well-nourished, and in no distress.  HENT:  Head: Normocephalic and atraumatic.  Right Ear: External ear normal.  Left Ear: External ear normal.  Mouth/Throat: Oropharynx is clear and moist. No oropharyngeal exudate.  Eyes: Pupils are equal, round, and reactive to  light. Conjunctivae are normal.  Neck: Neck supple.  Cardiovascular: Normal rate, regular rhythm, normal heart sounds and intact distal pulses.   Pulmonary/Chest: Effort normal and breath sounds normal. No respiratory distress. He has no wheezes. He has no rales. He exhibits no tenderness.  Neurological: He is alert and oriented to person, place, and time. He has normal strength. He displays facial symmetry and normal speech. A sensory deficit is present. Gait abnormal.  Skin: Skin is warm and dry. No rash noted.  Psychiatric: Affect normal.  Vitals reviewed.  Recent Results (from the past 2160 hour(s))  Protime-INR     Status: None   Collection Time: 09/27/16  6:53 AM  Result Value Ref Range   Prothrombin Time 12.6 11.4 - 15.2 seconds   INR 0.95   APTT     Status: None   Collection Time: 09/27/16  6:53 AM  Result Value Ref Range   aPTT 25 24 - 36 seconds  CBC     Status: None   Collection Time: 09/27/16  6:53 AM  Result Value Ref Range   WBC 6.4 4.0 - 10.5 K/uL   RBC 4.86 4.22 - 5.81 MIL/uL   Hemoglobin 14.3 13.0 - 17.0 g/dL   HCT 43.7 39.0 - 52.0 %   MCV 89.9 78.0 - 100.0 fL   MCH 29.4 26.0 - 34.0 pg   MCHC 32.7 30.0 - 36.0 g/dL   RDW 13.1 11.5 - 15.5 %   Platelets 182 150 - 400 K/uL  Differential     Status: None   Collection Time: 09/27/16  6:53 AM  Result Value Ref Range   Neutrophils Relative % 66 %   Neutro Abs 4.3 1.7 - 7.7 K/uL   Lymphocytes Relative 20 %   Lymphs Abs 1.3 0.7 - 4.0 K/uL   Monocytes Relative 8 %   Monocytes Absolute 0.5 0.1 - 1.0 K/uL   Eosinophils Relative 6 %   Eosinophils Absolute 0.4 0.0 - 0.7 K/uL   Basophils Relative 0 %   Basophils Absolute 0.0 0.0 - 0.1 K/uL  Comprehensive metabolic panel     Status: Abnormal   Collection Time: 09/27/16  6:53 AM  Result Value Ref Range   Sodium 137 135 - 145 mmol/L   Potassium 3.9 3.5 - 5.1 mmol/L   Chloride 100 (L) 101 - 111 mmol/L   CO2 26 22 - 32 mmol/L   Glucose, Bld 107 (H) 65 - 99 mg/dL   BUN  17 6 - 20 mg/dL   Creatinine, Ser 1.02 0.61 - 1.24 mg/dL   Calcium 9.3 8.9 - 10.3 mg/dL   Total Protein 6.3 (L) 6.5 - 8.1 g/dL   Albumin 4.3 3.5 -  5.0 g/dL   AST 16 15 - 41 U/L   ALT 20 17 - 63 U/L   Alkaline Phosphatase 44 38 - 126 U/L   Total Bilirubin 0.5 0.3 - 1.2 mg/dL   GFR calc non Af Amer >60 >60 mL/min   GFR calc Af Amer >60 >60 mL/min    Comment: (NOTE) The eGFR has been calculated using the CKD EPI equation. This calculation has not been validated in all clinical situations. eGFR's persistently <60 mL/min signify possible Chronic Kidney Disease.    Anion gap 11 5 - 15  I-stat troponin, ED     Status: None   Collection Time: 09/27/16  7:01 AM  Result Value Ref Range   Troponin i, poc 0.01 0.00 - 0.08 ng/mL   Comment 3            Comment: Due to the release kinetics of cTnI, a negative result within the first hours of the onset of symptoms does not rule out myocardial infarction with certainty. If myocardial infarction is still suspected, repeat the test at appropriate intervals.   I-Stat Chem 8, ED     Status: Abnormal   Collection Time: 09/27/16  7:03 AM  Result Value Ref Range   Sodium 138 135 - 145 mmol/L   Potassium 3.9 3.5 - 5.1 mmol/L   Chloride 101 101 - 111 mmol/L   BUN 19 6 - 20 mg/dL   Creatinine, Ser 1.00 0.61 - 1.24 mg/dL   Glucose, Bld 108 (H) 65 - 99 mg/dL   Calcium, Ion 1.10 (L) 1.15 - 1.40 mmol/L   TCO2 27 22 - 32 mmol/L   Hemoglobin 15.0 13.0 - 17.0 g/dL   HCT 44.0 39.0 - 52.0 %  CBG monitoring, ED     Status: Abnormal   Collection Time: 09/27/16  7:28 AM  Result Value Ref Range   Glucose-Capillary 109 (H) 65 - 99 mg/dL  MRSA PCR Screening     Status: None   Collection Time: 09/27/16  8:23 AM  Result Value Ref Range   MRSA by PCR NEGATIVE NEGATIVE    Comment:        The GeneXpert MRSA Assay (FDA approved for NASAL specimens only), is one component of a comprehensive MRSA colonization surveillance program. It is not intended to  diagnose MRSA infection nor to guide or monitor treatment for MRSA infections.   HIV antibody (Routine Testing)     Status: None   Collection Time: 09/27/16 11:03 AM  Result Value Ref Range   HIV Screen 4th Generation wRfx Non Reactive Non Reactive    Comment: (NOTE) Performed At: [facility] [place], [place] [id number] Lindon Romp MD OZ:[id number]   Lipid panel     Status: Abnormal   Collection Time: 09/28/16  9:38 AM  Result Value Ref Range   Cholesterol 176 0 - 200 mg/dL   Triglycerides 176 (H) <150 mg/dL   HDL 37 (L) >40 mg/dL   Total CHOL/HDL Ratio 4.8 RATIO   VLDL 35 0 - 40 mg/dL   LDL Cholesterol 104 (H) 0 - 99 mg/dL    Comment:        Total Cholesterol/HDL:CHD Risk Coronary Heart Disease Risk Table                     Men   Women  1/2 Average Risk   3.4   3.3  Average Risk       5.0   4.4  2 X Average Risk   9.6   7.1  3 X Average Risk  23.4   11.0        Use the calculated Patient Ratio above and the CHD Risk Table to determine the patient's CHD Risk.        ATP III CLASSIFICATION (LDL):  <100     mg/dL   Optimal  100-129  mg/dL   Near or Above                    Optimal  130-159  mg/dL   Borderline  160-189  mg/dL   High  >190     mg/dL   Very High   Hemoglobin A1c     Status: None   Collection Time: 09/28/16  9:38 AM  Result Value Ref Range   Hgb A1c MFr Bld 5.4 4.8 - 5.6 %    Comment: (NOTE) Pre diabetes:          5.7%-6.4% Diabetes:              >6.4% Glycemic control for   <7.0% adults with diabetes    Mean Plasma Glucose 108.28 mg/dL  CBC     Status: Abnormal   Collection Time: 09/29/16  3:06 AM  Result Value Ref Range   WBC 10.8 (H) 4.0 - 10.5 K/uL   RBC 4.96 4.22 - 5.81 MIL/uL   Hemoglobin 14.6 13.0 - 17.0 g/dL   HCT 44.3 39.0 - 52.0 %   MCV 89.3 78.0 - 100.0 fL   MCH 29.4 26.0 - 34.0 pg   MCHC 33.0 30.0 - 36.0 g/dL   RDW 13.0 11.5 - 15.5 %   Platelets 184 150 - 400 K/uL  Basic metabolic panel     Status:  Abnormal   Collection Time: 09/29/16  3:06 AM  Result Value Ref Range   Sodium 141 135 - 145 mmol/L   Potassium 4.0 3.5 - 5.1 mmol/L   Chloride 104 101 - 111 mmol/L   CO2 28 22 - 32 mmol/L   Glucose, Bld 111 (H) 65 - 99 mg/dL   BUN 22 (H) 6 - 20 mg/dL   Creatinine, Ser 1.00 0.61 - 1.24 mg/dL   Calcium 9.5 8.9 - 10.3 mg/dL   GFR calc non Af Amer >60 >60 mL/min   GFR calc Af Amer >60 >60 mL/min    Comment: (NOTE) The eGFR has been calculated using the CKD EPI equation. This calculation has not been validated in all clinical situations. eGFR's persistently <60 mL/min signify possible Chronic Kidney Disease.    Anion gap 9 5 - 15  ECHOCARDIOGRAM COMPLETE     Status: Abnormal   Collection Time: 09/29/16  1:33 PM  Result Value Ref Range   Weight 2,998.26 oz   Height 70 in   BP 106/78 mmHg   LV PW d 12.6 (A) 0.6 - 1.1 mm   FS 45 (A) 28 - 44 %   LA vol 31.4 mL   Ao-asc 30 cm   LA ID, A-P, ES 32 mm   IVS/LV PW RATIO, ED .91    Stroke v 46 ml   LVOT VTI 23.5 cm   LV e' LATERAL 8.38 cm/s   LV E/e' medial 11.35    LV E/e'average 11.35    LA diam index 1.58 cm/m2   LA vol A4C 30.6 ml   LVOT peak grad rest 6 mmHg   E decel time 236 msec   LVOT diameter 20  mm   LVOT area 3.14 cm2   LVOT peak vel 122 cm/s   LVOT SV 74.00 mL   Peak grad 4 mmHg   E/e' ratio 11.35    MV pk E vel 95.1 m/s   MV pk A vel 92.5 m/s   LV sys vol 17 (A) 21 - 61 mL   LV sys vol index 8.0 mL/m2   LV dias vol 63 62 - 150 mL   LV dias vol index 31.0 mL/m2   LA vol index 15.5 mL/m2   MV Dec 236    LA diam end sys 32.00 mm   Simpson's disk 73.00    TDI e' medial 8.38    TDI e' lateral 8.38    Lateral S' vel 12.50 cm/sec   TAPSE 23.60 mm  CBC     Status: None   Collection Time: 09/30/16  5:19 AM  Result Value Ref Range   WBC 8.5 4.0 - 10.5 K/uL   RBC 4.74 4.22 - 5.81 MIL/uL   Hemoglobin 14.0 13.0 - 17.0 g/dL   HCT 42.1 39.0 - 52.0 %   MCV 88.8 78.0 - 100.0 fL   MCH 29.5 26.0 - 34.0 pg   MCHC 33.3  30.0 - 36.0 g/dL   RDW 12.9 11.5 - 15.5 %   Platelets 174 150 - 400 K/uL  Basic metabolic panel     Status: Abnormal   Collection Time: 09/30/16  5:19 AM  Result Value Ref Range   Sodium 139 135 - 145 mmol/L   Potassium 3.8 3.5 - 5.1 mmol/L   Chloride 105 101 - 111 mmol/L   CO2 26 22 - 32 mmol/L   Glucose, Bld 100 (H) 65 - 99 mg/dL   BUN 22 (H) 6 - 20 mg/dL   Creatinine, Ser 0.91 0.61 - 1.24 mg/dL   Calcium 9.2 8.9 - 10.3 mg/dL   GFR calc non Af Amer >60 >60 mL/min   GFR calc Af Amer >60 >60 mL/min    Comment: (NOTE) The eGFR has been calculated using the CKD EPI equation. This calculation has not been validated in all clinical situations. eGFR's persistently <60 mL/min signify possible Chronic Kidney Disease.    Anion gap 8 5 - 15    Assessment/Plan: ICH (intracerebral hemorrhage) (HCC) Doing very well considering. Motor function intact. Left-sided sensory function impaired. Gait impaired, using cane to ambulate. Needs PT/OT. Will be set up through Neurology. Patient to follow-up with Dr. Jaynee Eagles as scheduled. Continue current medication regimen.    Leeanne Rio, PA-C

## 2016-10-02 NOTE — Telephone Encounter (Signed)
Pt just wanted Dr Jaynee Eagles to be made aware that re: his [place] will be faxing forms in this week for him that require filling out.  Pt has not requested a call back

## 2016-10-02 NOTE — Patient Instructions (Signed)
Please follow the bowel regimen below to help with movements.  Increase fluid intake since you are eating a high fiber diet.  I encourage you to increase hydration and the amount of fiber in your diet.  Start a daily probiotic (Align, Culturelle, Digestive Advantage, etc.). If no bowel movement within 24 hours, take 2 Tbs of Milk of Magnesia in a 4 oz glass of warmed prune juice every 2-3 days to help promote bowel movement. If no results within 24 hours, then repeat above regimen, adding a Dulcolax stool softener to regimen. If this does not promote a bowel movement, please call the office.  Continue chronic medications as directed. [facility] is going to be calling you this afternoon to schedule all appointments!  Let me know if there is any issue with this.  Keep working on things. Hopefully sensation will start to improve. I am glad that things were not worse!  Follow-up with me in 3-4 weeks.

## 2016-10-03 ENCOUNTER — Telehealth: Payer: Self-pay | Admitting: Neurology

## 2016-10-03 DIAGNOSIS — R278 Other lack of coordination: Secondary | ICD-10-CM

## 2016-10-03 DIAGNOSIS — R29898 Other symptoms and signs involving the musculoskeletal system: Secondary | ICD-10-CM

## 2016-10-03 NOTE — Telephone Encounter (Signed)
Called patient, and he stated that his PCP told him to have Dr Jaynee Eagles order OT/PT at our facility. He was seen by Dr Jaynee Eagles in hospital, d'c on 09/30/16. He does not have a follow up in our office at this time, is to follow up in 6 weeks. advised patient will discuss with Dr Jaynee Eagles about OT/PT referrals and when his FU with her should be scheduled. Advised will call him back. He verbalized understanding,  appreciation.

## 2016-10-03 NOTE — Telephone Encounter (Signed)
Pt is asking for a call re: the Occupational and Physical therapy starting for him as a result of his stroke. Pt also want Dr Jaynee Eagles to be advised that he has been contacted by [facility] and will see him on 10-24 re: [place] knife surgery Please call

## 2016-10-03 NOTE — Telephone Encounter (Signed)
That's fine, I will definitely fill this out for him thanks

## 2016-10-04 ENCOUNTER — Encounter: Payer: Self-pay | Admitting: Physician Assistant

## 2016-10-04 NOTE — Telephone Encounter (Signed)
Called patient and informed him OT/PT referrals have been placed, and he should get a call within a few days to schedule. Patient asked if his disability papers have been received. This RN checked with medical records and on RN's desk; advised him no papers have been received. Gave him MR fax number; he stated he would call [employer] and advise to fax again. He verbalized understanding, appreciation.

## 2016-10-04 NOTE — Telephone Encounter (Signed)
Pt called back, told him msg had been sent to Dr Jaynee Eagles, RN was waiting for response from provider.

## 2016-10-04 NOTE — Telephone Encounter (Signed)
Yes, please place PT/OT order for patient. Have him follo wup with me after his [place] appointment unless he is having any problems he needs to address sooner thanks!

## 2016-10-04 NOTE — Addendum Note (Signed)
Addended by: Minna Antis on: 10/04/2016 04:56 PM   Modules accepted: Orders

## 2016-10-05 ENCOUNTER — Other Ambulatory Visit: Payer: Self-pay | Admitting: *Deleted

## 2016-10-05 ENCOUNTER — Encounter: Payer: Self-pay | Admitting: Physician Assistant

## 2016-10-05 ENCOUNTER — Encounter: Payer: Self-pay | Admitting: Neurology

## 2016-10-05 NOTE — Telephone Encounter (Signed)
Pt did not rec' a call today reg OT/PT. I don't see a referral. Can you check on this please

## 2016-10-05 NOTE — Patient Outreach (Addendum)
[facility]) Care Management  10/05/2016  Todd Harris [birth year] [id number]  EMMI-Stroke referral-Red Alert-Day# 1, 10/03/2016; Reason: Problems setting up Rehab-Yes  Call # 1 to patient; left HIPPA compliant voice mail requesting call back.  Received return call from patient. Patient voices that he recently was hospitalized with diagnosis- hemorrhagic stroke. States he has spoken with Primary care office & neuro office about outpatient services.  States has numbness of entire left side of body which has not changed since discharged.  States he is awaiting call back from neuro office to schedule outpatient therapy. Voices that he has completed hospital follow up appointment with primary care provider 10/02/2016. States he is managing his own medications & taking as prescribed. States he has schedule appointment with neurosurgeon 10.25. States spouse is doing all of the driving.   Patient was advised of falls precautions & importance of calling 911 if he experiences stroke symptoms. Patient voices understanding . States has stroke educational literature. Patient encouraged to do exercises that he was taught while hospitalized. States he has been in contact with neuro office regarding setting up rehab services & will follow up as needed.   EMMI call addressed.  Plan: Send to care management assistant to close case.    Sherrin Daisy, RN BSN [place] Management Coordinator [facility] Care Management  [phone]

## 2016-10-06 ENCOUNTER — Ambulatory Visit: Payer: Self-pay | Admitting: *Deleted

## 2016-10-06 NOTE — Telephone Encounter (Signed)
signed

## 2016-10-06 NOTE — Addendum Note (Signed)
Addended by: Minna Antis on: 10/06/2016 09:04 AM   Modules accepted: Orders

## 2016-10-06 NOTE — Telephone Encounter (Signed)
See Mychart message. Can we contact [facility] to see what the hold up is? It was my understanding that the PT/OT would be set up there by Neurology. If I need to place urgent referral I will. Thank you.

## 2016-10-08 NOTE — Assessment & Plan Note (Signed)
Doing very well considering. Motor function intact. Left-sided sensory function impaired. Gait impaired, using cane to ambulate. Needs PT/OT. Will be set up through Neurology. Patient to follow-up with Dr. Jaynee Eagles as scheduled. Continue current medication regimen.

## 2016-10-09 NOTE — Telephone Encounter (Signed)
Patient is scheduled for 08/10/2016 For PT/ OT.

## 2016-10-09 NOTE — Telephone Encounter (Signed)
PT/OT orders re-entered to internal [facility].

## 2016-10-09 NOTE — Addendum Note (Signed)
Addended by: Minna Antis on: 10/09/2016 01:38 PM   Modules accepted: Orders

## 2016-10-10 ENCOUNTER — Encounter: Payer: Self-pay | Admitting: Occupational Therapy

## 2016-10-10 ENCOUNTER — Ambulatory Visit: Payer: 59 | Admitting: Occupational Therapy

## 2016-10-10 ENCOUNTER — Ambulatory Visit: Payer: 59 | Attending: Physician Assistant | Admitting: Physical Therapy

## 2016-10-10 ENCOUNTER — Encounter: Payer: Self-pay | Admitting: Neurology

## 2016-10-10 DIAGNOSIS — R278 Other lack of coordination: Secondary | ICD-10-CM | POA: Insufficient documentation

## 2016-10-10 DIAGNOSIS — R42 Dizziness and giddiness: Secondary | ICD-10-CM | POA: Diagnosis present

## 2016-10-10 DIAGNOSIS — I69254 Hemiplegia and hemiparesis following other nontraumatic intracranial hemorrhage affecting left non-dominant side: Secondary | ICD-10-CM | POA: Diagnosis not present

## 2016-10-10 DIAGNOSIS — R41842 Visuospatial deficit: Secondary | ICD-10-CM | POA: Diagnosis present

## 2016-10-10 DIAGNOSIS — R29818 Other symptoms and signs involving the nervous system: Secondary | ICD-10-CM | POA: Insufficient documentation

## 2016-10-10 DIAGNOSIS — R2681 Unsteadiness on feet: Secondary | ICD-10-CM | POA: Diagnosis present

## 2016-10-10 DIAGNOSIS — R2689 Other abnormalities of gait and mobility: Secondary | ICD-10-CM | POA: Diagnosis not present

## 2016-10-10 NOTE — Therapy (Signed)
[facility] [address] [place], [place], [address] Phone: [phone]   Fax:  [phone]  Occupational Therapy Evaluation  Patient Details  Name: Todd Harris MRN: [id number] Date of Birth: 1960/04/13 Referring Provider: Dr Heide Spark  Encounter Date: 10/10/2016      OT End of Session - 10/10/16 1741    Visit Number 1   Number of Visits 16   Date for OT Re-Evaluation 12/05/16   Authorization Type [employer] 20 visits for OT and 20 visits for PT   OT Start Time 1316   OT Stop Time 1400   OT Time Calculation (min) 44 min   Activity Tolerance Patient tolerated treatment well      Past Medical History:  Diagnosis Date  . Anxiety   . Atrial arrhythmia   . Barrett esophagus   . Bipolar 1 disorder ([place])   . Cavernous angioma   . Depression   . History of chicken pox   . Hypertension   . Seizures ([place])    1 in 2005, 1 in 2016   . Sleep apnea   . Sleep apnea   . Stroke ([place]) 11/2014   No residual deficits  . Venous malformation    Brain    Past Surgical History:  Procedure Laterality Date  . APPENDECTOMY  1970   age 49  . bone spur     age 62  . kidney stones    . TONSILLECTOMY  1968    There were no vitals filed for this visit.      Subjective Assessment - 10/10/16 1322    Subjective  I don't really understand what happened to me and why I have these issues.   Patient is accompained by: Family member  wife [name]   Pertinent History ICH, h/o of multiple cavernomosas   Patient Stated Goals I want to be able to do normal daily functions, type on computer if possible., deal with computer efficiently, possibly driving.            [facility] OT Assessment - 10/10/16 0001      Assessment   Diagnosis ICH R pons   Referring Provider Dr Heide Spark   Onset Date 09/27/16   Assessment 09/30/2016   Prior Therapy Acute PT, OT and ST     Precautions   Precautions Other (comment)   Precaution Comments No  driving     Restrictions   Weight Bearing Restrictions No     Balance Screen   Has the patient fallen in the past 6 months No  pt to have PT eval     Home  Environment   Family/patient expects to be discharged to: Private residence   Living Arrangements Spouse/significant other   Available Help at Discharge Available PRN/intermittently   Type of [place] Two level   Bathroom Shower/Tub Interior Toilet Standard   Additional Comments hand held shower, shower seat, holds onto hand held shower to enter and exit shower.     Prior Function   Level of Independence Independent   Vocation Full time employment   Vocation Requirements IT guy   Leisure watch tv, computer, putz around the yard, exercise     ADL   Eating/Feeding Minimal assistance  for cutting   Grooming Independent   Upper Body Bathing Independent   Lower Body Bathing Modified independent  uses shower seat occassionally   Upper Body Dressing Increased time  for buttons  Lower Body Dressing Modified independent   Toilet Transfer Independent   Toileting - Clothing Manipulation Independent   Toileting -  Hygiene Independent   Tub/Shower Transfer Modified independent   ADL comments Pt uses hand held shower to enter and exit - may benefit from grab bar in shower.     IADL   Shopping Needs to be accompanied on any shopping trip   Light Housekeeping Performs light daily tasks such as dishwashing, bed making   Meal Prep Plans, prepares and serves adequate meals independently   [place] on family or friends for transportation   Medication Management Is responsible for taking medication in correct dosages at correct time   Physiological scientist financial matters independently (budgets, writes checks, pays rent, bills goes to bank), collects and keeps track of income     Mobility   Mobility Status Independent   Mobility Status Comments pt reports that he has walked  around in the neighborhood and but does reports periods of dizziness     Written Expression   Dominant Hand Right   Handwriting 100% legible     Vision - History   Baseline Vision Wears glasses all the time     Vision Assessment   Eye Alignment Impaired (comment)  very mildly   Ocular Range of Motion Within Functional Limits   Tracking/Visual Pursuits Able to track stimulus in all quads without difficulty   Saccades Undershoots  vertical and diagonal    Convergence Impaired (comment)   Diplopia Assessment --  intermittent with reading on 2 occassions   Comment Pt with gaze stabilization deficits, decreased VOR, impaired saccadic movement, L bias with functional ambulation, impaired visual vestibular integration, disequilibrium with head turns and head nods.       Activity Tolerance   Activity Tolerance Tolerate 30+ min activity without fatigue  states he can go about 4 hours and then is exhausted     Cognition   Overall Cognitive Status Within Functional Limits for tasks assessed     Sensation   Light Touch Impaired Detail  dulled with some hypersensitivity to midline   Hot/Cold Appears Intact   Proprioception Appears Intact     Coordination   Gross Motor Movements are Fluid and Coordinated Yes   Fine Motor Movements are Fluid and Coordinated No   Finger Nose Finger Test moderately impaired     Tone   Assessment Location Left Upper Extremity     ROM / Strength   AROM / PROM / Strength AROM;Strength     Strength   Overall Strength Deficits   Overall Strength Comments proximal LUE 4+/5, all other for LUE WFL's.  Mild decrease in grip strength see below     Hand Function   Right Hand Gross Grasp Functional   Right Hand Grip (lbs) 90   Left Hand Gross Grasp Impaired   Left Hand Grip (lbs) 75  relative to R hand     LUE Tone   LUE Tone Within Functional Limits                         OT Education - 10/10/16 1523    Education provided Yes    Education Details visual vestibular issues related to [place], role of OT, results of eval   Person(s) Educated Patient;Spouse   Methods Explanation   Comprehension Verbalized understanding          OT Short Term Goals - 10/10/16 1524  OT SHORT TERM GOAL #1   Title Pt will be mod I with HEP for visual vestibular, LUE coordination and strength - 11/07/2016   Status New     OT SHORT TERM GOAL #2   Title Assess 9 hole peg and set goal as appropriate   Status New     OT SHORT TERM GOAL #3   Title Pt will rate disequilibrium no higher than 5/10 with functional activities requiring head turns, bending over (i.e. home mgmt tasks)   Status New     OT SHORT TERM GOAL #4   Title Pt will report no episodes of diplopia when reading   Status New     OT SHORT TERM GOAL #5   Title Pt will report greater ease in buttoning shirt with L hand   Status New           OT Long Term Goals - 10/10/16 1527      OT LONG TERM GOAL #1   Title Pt will be mod I with upgraded HEP - 12/05/2016   Status New     OT LONG TERM GOAL #2   Title Pt will demostrate ability to lift 6 pound object x3 with LUE to overhead shelf without dropping.   Status New     OT LONG TERM GOAL #3   Title Pt will rate disequilibrium sensation 3/10 or less with home mgmt tasks that require head turns, head nods, full body turns and bending over.      OT LONG TERM GOAL #4   Title Pt will demonstrate ability to manuever on uneven outdoor surfaces with transitonal head/eye movement as prep to return to yard work with disequilibrium rated no more than 3/10.   Status New               Plan - 10/10/16 1728    Clinical Impression Statement Pt is 56 year old male s/p ICH R pons on 09/27/2016 and was discharged home on 09/30/2016.  Pt with significant medical history (see below).  Pt presents today with the following impairments that impact ADL, IADL, work and leisure:  L non dominant hemiplegia, incoodination LUE, impaired  sensation, visual vestibular integration deficits, decreased activity tolerance,  decreased balance,    Occupational Profile and client history currently impacting functional performance h/o of multiple cavernomas, seizures, atrial arythmias, HTN, sleep apnea, prior stroke 2016.     Occupational performance deficits (Please refer to evaluation for details): ADL's;IADL's;Work;Leisure;Social Participation   Rehab Potential Good   OT Frequency 2x / week   OT Duration 8 weeks   OT Treatment/Interventions Self-care/ADL training;Therapeutic exercise;Neuromuscular education;Therapist, nutritional;Therapeutic activities;Visual/perceptual remediation/compensation;Patient/family education;Balance training;DME and/or AE instruction   Plan 9 hole peg, initiate HEP for coordination, further assess visual vestibular   Consulted and Agree with Plan of Care Patient;Family member/caregiver   Family Member Consulted wife [name]      Patient will benefit from skilled therapeutic intervention in order to improve the following deficits and impairments:  Abnormal gait, Decreased activity tolerance, Decreased balance, Decreased coordination, Decreased mobility, Decreased knowledge of use of DME, Decreased strength, Difficulty walking, Impaired UE functional use, Impaired sensation, Impaired vision/preception  Visit Diagnosis: Visuospatial deficit - Plan: Ot plan of care cert/re-cert  Unsteadiness on feet - Plan: Ot plan of care cert/re-cert  Other lack of coordination - Plan: Ot plan of care cert/re-cert  Other symptoms and signs involving the nervous system - Plan: Ot plan of care cert/re-cert    Problem List Patient Active  Problem List   Diagnosis Date Noted  . Cerebral cavernous malformation 09/28/2016  . Seizures ([place]) 09/28/2016  . Keratoma 09/28/2016  . ICH (intracerebral hemorrhage) ([place]) 09/27/2016  . Hemangioma of skin and subcutaneous tissue 07/07/2016  . Visit for preventive health  examination 07/07/2016  . Prostate cancer screening 07/07/2016  . Obstructive sleep apnea 12/23/2015  . Arteriovenous malformation of brain 12/06/2015  . Cavernous hemangioma of brain ([place]) 12/30/2014  . Bipolar disorder ([place]) 12/30/2014  . HTN (hypertension) 12/30/2014    Quay Burow, OTR/L 10/10/2016, 5:48 PM  [facility] [address] [place], [place], [address] Phone: [phone]   Fax:  [phone]  Name: Todd Harris MRN: [id number] Date of Birth: 1960/04/19

## 2016-10-10 NOTE — Therapy (Signed)
[facility] [address] [place], [place], [address] Phone: [phone]   Fax:  [phone]  Physical Therapy Evaluation  Patient Details  Name: Todd Harris MRN: [id number] Date of Birth: 01-24-1960 Referring Provider: Dr. Sarina Ill  Encounter Date: 10/10/2016      PT End of Session - 10/11/16 1543    Visit Number 1   Number of Visits 17   Date for PT Re-Evaluation 12/09/16   Authorization Type U[employer]   Authorization - Visit Number 1   Authorization - Number of Visits 20   PT Start Time 2952   PT Stop Time 1620   PT Time Calculation (min) 45 min      Past Medical History:  Diagnosis Date  . Anxiety   . Atrial arrhythmia   . Barrett esophagus   . Bipolar 1 disorder ([place])   . Cavernous angioma   . Depression   . History of chicken pox   . Hypertension   . Seizures ([place])    1 in 2005, 1 in 2016   . Sleep apnea   . Sleep apnea   . Stroke ([place]) 11/2014   No residual deficits  . Venous malformation    Brain    Past Surgical History:  Procedure Laterality Date  . APPENDECTOMY  1970   age 83  . bone spur     age 18  . kidney stones    . TONSILLECTOMY  1968    There were no vitals filed for this visit.       Subjective Assessment - 10/11/16 1518    Subjective Pt is 56 yr old male s/p ICH Rt pons due to cavernoma - pt states he awoke around 4 AM on 09-27-16 to go to bathroom, went back to bed and woke up at 5:30 with Lt sided numbness; pt was hospitalized 09-27-16 until 09-30-16 due to [place] Rt pons; pt is amb. with minimal to no head movement due to c/o dizziness; is not using an assistive device - is accompnaied by his wife   Patient is accompained by: Family member  [name]   Pertinent History cerebral cavernous malformation, seizures, ICH, h/o stroke 11/2014, h/o multiple cavernomas, atrial arrhythmia   Patient Stated Goals "Be able to cope with dizziness and walk downstairs without feeling like I'm  going to fall; be able to not have to take the Meclizine"   Currently in Pain? No/denies            [facility] PT Assessment - 10/11/16 0001      Assessment   Medical Diagnosis ICH Rt Pons due to cavernous angioma   Referring Provider Dr. Sarina Ill   Onset Date/Surgical Date 09/27/16   Hand Dominance Right   Prior Therapy in acute - [facility]      Precautions   Precautions Other (comment)   Precaution Comments No driving     Restrictions   Weight Bearing Restrictions No     Balance Screen   Has the patient fallen in the past 6 months No  pt reports occurrences of LOB but no falls   Has the patient had a decrease in activity level because of a fear of falling?  No   Is the patient reluctant to leave their home because of a fear of falling?  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   Leisure watch tv, computer, plays with [employer] puppy, exercise     Observation/Other Assessments   Focus on Therapeutic Outcomes (FOTO)  68/100 for pt's primary measure: risk adjusted 58/100   Neuro Quality of Life  NEURO QOL LE 41.6     Sensation   Light Touch Impaired Detail  reports numbness on Lt side of body   Hot/Cold Appears Intact   Proprioception Appears Intact     Tone   Assessment Location Left Lower Extremity     ROM / Strength   AROM / PROM / Strength AROM;Strength     AROM   Overall AROM  Within functional limits for tasks performed     Strength   Overall Strength Within functional limits for tasks performed     Transfers   Transfers Sit to Stand   Sit to Stand 4: Min guard   Number of Reps Other reps (comment)  2    Transfer Cueing instructed to perform without UE support   Comments Increased dizziness with transfer without UE support and without stabilization upon initial sit to stand     Ambulation/Gait   Ambulation/Gait Yes   Ambulation/Gait Assistance 5: Supervision   Ambulation/Gait Assistance Details Pt unable to turn head during gait due to c/o mod. to severe dizziness   Ambulation Distance (Feet) 100 Feet   Assistive device None   Gait Pattern Ataxic;Wide base of support;Decreased trunk rotation;Step-through pattern  pt looks down at floor due to dizziness   Ambulation Surface Level;Indoor   Gait velocity 12.58= 2.56 ft/sec   Stairs Yes   Stairs Assistance 5: Supervision   Stair Management Technique Two rails;Alternating pattern;Step to pattern;Forwards   Number of Stairs 4   Height of Stairs 6     Standardized Balance Assessment   Standardized Balance Assessment Timed Up and Go Test     Timed Up and Go Test   Normal TUG (seconds) 14.53     High Level Balance   High Level Balance Comments Pt able to stand with EC feet apart without LOB for 10 secs.     LUE Tone   LUE Tone Within Functional Limits            Vestibular Assessment - 10/11/16 0001      Vestibular Assessment   General Observation Pt is a 56 yr old male s/p ICH Rt pons due to cavernous angioma on 09-27-16.  Pt presents with c/o dizziness with gaze stabilization and VOR deficits with inability to turn head during amb. without LOB     Symptom Behavior   Type of Dizziness Diplopia   Frequency of Dizziness varies depending on head movement   Duration of Dizziness depends on activity - duration usually several minutes   Aggravating Factors Turning head quickly   Relieving Factors Head stationary     Occulomotor Exam   Occulomotor Alignment Normal   Spontaneous Absent   Smooth Pursuits Comment  nystagmus with horizontal testing initially, but habituated    Saccades Slow  provoked dizziness   Comment Pt had  taken Meclizine prior to eval        Objective measurements completed on examination: See above findings.  PT Education - 10/11/16 1542    Education provided Yes   Education Details sit to stand without UE support and without stabilization of LE's; begin small head turns during gait in home environment   Person(s) Educated Patient;Spouse   Methods Explanation   Comprehension Verbalized understanding;Returned demonstration          PT Short Term Goals - 10/11/16 1604      PT SHORT TERM GOAL #1   Title Improve TUG score from 14.53 to </= 10 secs without device with c/o dizziness </= 2/10 intensity.   Baseline 14.53 secs = vertigo 4/10 intensity   Time 4   Period Weeks   Status New   Target Date 11/10/16     PT SHORT TERM GOAL #2   Title Increase gait velocity from 2.56 ft/sec to >/= 3.0 ft/sec for incr. gait efficiency.   Baseline 12.58 secs = 2.56 ft/sec   Time 4   Period Weeks   Status New   Target Date 11/10/16     PT SHORT TERM GOAL #3   Title Perform DVA test and establish goal as appropriate.   Baseline unable to tolerate testing due to c/o severe vertigo with head movement   Time 4   Period Weeks   Status New   Target Date 11/10/16     PT SHORT TERM GOAL #4   Title Complete Berg test and set goal as appropriate.   Time 4   Period Weeks   Status New   Target Date 11/10/16     PT SHORT TERM GOAL #5   Title Negotiate steps with 1 hand rail using a step over step sequence with dizziness </= 2/10   Time 4   Period Weeks   Status New   Target Date 11/10/16     Additional Short Term Goals   Additional Short Term Goals Yes     PT SHORT TERM GOAL #6   Title Complete SOT and establish goal as appropriate   Time 4   Period Weeks   Status New   Target Date 11/10/16     PT SHORT TERM GOAL #7   Title Independent in HEP for balance and vestibular exercises.   Time 4   Period Weeks   Status New   Target Date 11/10/16            PT Long Term Goals - 10/11/16 1612      PT LONG TERM GOAL #1   Title Increase gait velocity to >/= 3.5 ft/sec for increased gait efficiency.   Baseline 2.56 ft/sec on 10-10-16   Time 8   Period Weeks   Status New   Target Date 12/09/16     PT LONG TERM GOAL #2   Title Increase Berg score to at least 50/56 to decrease fall risk.   Time 8   Period Weeks   Status New   Target Date 12/09/16     PT LONG TERM GOAL #3   Title Increase DGI to at least 19/24 to reduce fall risk.   Time 8   Period Weeks   Status New   Target Date 12/09/16     PT LONG TERM GOAL #4   Title Improve DVA to </= 3 line difference.     Baseline unable to tolerate testing on 10-10-16 due to severity of vertigo   Time 8   Period Weeks   Status New   Target Date 12/09/16     PT LONG TERM GOAL #  5   Title Report at least 75% improvement in vertigo.   Time 8   Period Weeks   Status New   Target Date 12/09/16     Additional Long Term Goals   Additional Long Term Goals Yes     PT LONG TERM GOAL #6   Title Independent in updated HEP as appropriate.   Time 8   Period Weeks   Status New   Target Date 12/09/16                Plan - 10/11/16 1545    Clinical Impression Statement Pt is a 56 yr old male s/p ICH Rt pons due to cavernous angioma on 09-27-16.  Pt was hospitalized until 09-30-16 and discharged home.  Pt presents with visual/vestibular impairments with impaired gaze stabilization and VOR deficits, c/o diplopia, and impaired balance and gait with inaiblity to amb. with head turns due to dizziness and LOB.  PMH includes cerebral cavernous malformation, seizures, h/o stroke in Nov. 2016, atrial arrhythmia, and h/o mulitple cavernomas, and AVM.                                                                                                                                                                         History and Personal Factors relevant to plan of care: pt works in [place]; h/o  multiple cavernomas; h/o stroke in Nov. 2016; bipolar disorder   Clinical Presentation Evolving   Clinical Presentation due to: [place] Rt pons due to cavernous angioma   Clinical Decision Making Moderate   Rehab Potential Good   Clinical Impairments Affecting Rehab Potential impaired sensation Lt side of body   PT Frequency 2x / week   PT Duration 8 weeks   PT Treatment/Interventions ADLs/Self Care Home Management;Neuromuscular re-education;Balance training;Therapeutic exercise;Therapeutic activities;Stair training;Gait training;Patient/family education;Vestibular;Visual/perceptual remediation/compensation   PT Next Visit Plan complete Berg; DGI; begin gaze stabilization for HEP   PT Home Exercise Plan sit to stand without UE support, amb. 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 Cerebral cavernous malformation 09/28/2016  . Seizures ([place]) 09/28/2016  . Keratoma 09/28/2016  . ICH (intracerebral hemorrhage) ([place]) 09/27/2016  . Hemangioma of skin and  subcutaneous tissue 07/07/2016  . Visit for preventive health examination 07/07/2016  . Prostate cancer screening 07/07/2016  . Obstructive sleep apnea 12/23/2015  . Arteriovenous malformation of brain 12/06/2015  . Cavernous hemangioma of brain ([place]) 12/30/2014  . Bipolar disorder ([place]) 12/30/2014  . HTN (hypertension) 12/30/2014    [place], [place], PT 10/11/2016, 4:29 PM  [facility] [address] [place], [place], [address] Phone: [phone]   Fax:  [phone]  Name: Todd Harris MRN: [id number] Date of Birth: [birth year]

## 2016-10-11 NOTE — Patient Instructions (Signed)
Functional Quadriceps: Sit to Stand    Sit on edge of chair, feet flat on floor. Stand upright, extending knees fully. Repeat ___10_ times per set. Do __1__ sets per session. Do __1__ sessions per day.  TRY NOT TO USE HANDS  http://orth.exer.us/734   Copyright  VHI. All rights reserved.

## 2016-10-12 ENCOUNTER — Encounter: Payer: Self-pay | Admitting: Occupational Therapy

## 2016-10-12 ENCOUNTER — Ambulatory Visit: Payer: 59 | Admitting: Occupational Therapy

## 2016-10-12 DIAGNOSIS — R278 Other lack of coordination: Secondary | ICD-10-CM

## 2016-10-12 DIAGNOSIS — R42 Dizziness and giddiness: Secondary | ICD-10-CM | POA: Diagnosis not present

## 2016-10-12 DIAGNOSIS — I69254 Hemiplegia and hemiparesis following other nontraumatic intracranial hemorrhage affecting left non-dominant side: Secondary | ICD-10-CM

## 2016-10-12 DIAGNOSIS — R29818 Other symptoms and signs involving the nervous system: Secondary | ICD-10-CM

## 2016-10-12 DIAGNOSIS — R2681 Unsteadiness on feet: Secondary | ICD-10-CM

## 2016-10-12 DIAGNOSIS — R41842 Visuospatial deficit: Secondary | ICD-10-CM

## 2016-10-12 NOTE — Patient Instructions (Signed)
  Coordination Activities  Perform the following activities for 15-20 minutes 1-2 times per day with left hand(s).   Rotate ball in fingertips (clockwise and counter-clockwise).  Toss ball between hands.  Toss ball in air and catch with the same hand.  Flip cards 1 at a time as fast as you can.  Pick up coins and place in container or coin bank.  Pick up coins and stack.  Pick up coins one at a time until you get 5-10 in your hand, then move coins from palm to fingertips to stack one at a time.  Practice writing and/or typing.  Screw together nuts and bolts, then unfasten.   For typing, start by only typing with your left hand.  Only type A, S, D, F, G to start.  We will ramp this up later.  You will likely need to look at your hand due to the altered sensation.

## 2016-10-12 NOTE — Therapy (Signed)
[facility] [address] [place], [place], [address] Phone: [phone]   Fax:  [phone]  Occupational Therapy Treatment  Patient Details  Name: Todd Harris MRN: [id number] Date of Birth: 02-11-1960 Referring Provider: Dr Heide Spark  Encounter Date: 10/12/2016      OT End of Session - 10/12/16 1221    Visit Number 2   Number of Visits 16   Date for OT Re-Evaluation 12/05/16   Authorization Type [employer] 20 visits for OT and 20 visits for PT   OT Start Time 0931   OT Stop Time 1015   OT Time Calculation (min) 44 min   Activity Tolerance Patient tolerated treatment well      Past Medical History:  Diagnosis Date  . Anxiety   . Atrial arrhythmia   . Barrett esophagus   . Bipolar 1 disorder ([place])   . Cavernous angioma   . Depression   . History of chicken pox   . Hypertension   . Seizures ([place])    1 in 2005, 1 in 2016   . Sleep apnea   . Sleep apnea   . Stroke ([place]) 11/2014   No residual deficits  . Venous malformation    Brain    Past Surgical History:  Procedure Laterality Date  . APPENDECTOMY  1970   age 56  . bone spur     age 56  . kidney stones    . TONSILLECTOMY  1968    There were no vitals filed for this visit.      Subjective Assessment - 10/12/16 0933    Subjective  Thanks for going over these goals.    Patient is accompained by: Family member  wife   Pertinent History ICH, h/o of multiple cavernomosas   Patient Stated Goals I want to be able to do normal daily functions, type on computer if possible., deal with computer efficiently, possibly driving.    Currently in Pain? No/denies                      OT Treatments/Exercises (OP) - 10/12/16 0001      ADLs   ADL Comments Reviewed goals and POC;  pt wiht excellent questions and provided answers to all questions regarding POC, etiology and symptoms.       Fine Motor Coordination   Other Fine Motor Exercises  Completed 9 hole test - see goal section for details.  Issued HEP for LUE coordination - pt understands that he will work on this at home to allow therapy sessions to address issues that he needs more assistance with.                 OT Education - 10/12/16 1215    Education provided Yes   Education Details HEP for fine motor coordination, goals, POC   Person(s) Educated Patient;Spouse   Methods Explanation;Demonstration;Verbal cues;Handout   Comprehension Verbalized understanding;Returned demonstration          OT Short Term Goals - 10/12/16 1217      OT SHORT TERM GOAL #1   Title Pt will be mod I with HEP for visual vestibular, LUE coordination and strength - 11/07/2016   Status On-going     OT SHORT TERM GOAL #2   Status Achieved     OT SHORT TERM GOAL #3   Title Pt will rate disequilibrium no higher than 5/10 with functional activities requiring head turns, bending over (i.e. home mgmt  tasks)   Status On-going     OT SHORT TERM GOAL #4   Title Pt will report no episodes of diplopia when reading   Status On-going     OT SHORT TERM GOAL #5   Title Pt will report greater ease in buttoning shirt with L hand   Status On-going     OT SHORT TERM GOAL #6   Title Pt will demonstrate improved LUE coordinaton as evidenced by decreasing time on 9 hole peg by at least 5 seconds to aide in functional activity (baseline=42.10)   Status New           OT Long Term Goals - 10/12/16 1218      OT LONG TERM GOAL #1   Title Pt will be mod I with upgraded HEP - 12/05/2016   Status On-going     OT LONG TERM GOAL #2   Title Pt will demostrate ability to lift 6 pound object x3 with LUE to overhead shelf without dropping.   Status On-going     OT LONG TERM GOAL #3   Title Pt will rate disequilibrium sensation 3/10 or less with home mgmt tasks that require head turns, head nods, full body turns and bending over.    Status On-going     OT LONG TERM GOAL #4   Title Pt will  demonstrate ability to manuever on uneven outdoor surfaces with transitonal head/eye movement as prep to return to yard work with disequilibrium rated no more than 3/10.   Status On-going     OT LONG TERM GOAL #5   Title Pt will demonstrate improved coordination LUE as evidenced by decreasing time on 9 hole peg by 8 seconds to aide in functional activity (baseline= 42.10)   Status New               Plan - 10/12/16 1219    Clinical Impression Statement Pt in agreement with goals and POC.  Pt progressing toward goals.    Rehab Potential Good   OT Frequency 2x / week   OT Duration 8 weeks   OT Treatment/Interventions Self-care/ADL training;Therapeutic exercise;Neuromuscular education;Therapist, nutritional;Therapeutic activities;Visual/perceptual remediation/compensation;Patient/family education;Balance training;DME and/or AE instruction   Plan check HEP, provide visuo vestibular HEP as tolerated, address balance via functional tasks with emphasis on head turns, body turns, transitional movements and decreasing wide base of support. Check balance reactions.   Consulted and Agree with Plan of Care Patient;Family member/caregiver   Family Member Consulted wife [name]      Patient will benefit from skilled therapeutic intervention in order to improve the following deficits and impairments:  Abnormal gait, Decreased activity tolerance, Decreased balance, Decreased coordination, Decreased mobility, Decreased knowledge of use of DME, Decreased strength, Difficulty walking, Impaired UE functional use, Impaired sensation, Impaired vision/preception  Visit Diagnosis: Visuospatial deficit  Unsteadiness on feet  Other lack of coordination  Other symptoms and signs involving the nervous system  Hemiplegia and hemiparesis 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  Cerebral cavernous malformation 09/28/2016  . Seizures ([place]) 09/28/2016  . Keratoma 09/28/2016  . ICH (intracerebral hemorrhage) ([place]) 09/27/2016  . Hemangioma of skin and subcutaneous tissue 07/07/2016  . Visit for preventive health examination 07/07/2016  . Prostate cancer screening 07/07/2016  . Obstructive sleep apnea 12/23/2015  . Arteriovenous malformation of brain 12/06/2015  . Cavernous hemangioma of brain ([place]) 12/30/2014  . Bipolar disorder (  [place]) 12/30/2014  . HTN (hypertension) 12/30/2014    Quay Burow, OTR/L 10/12/2016, 12:22 PM  [facility] [address] [place], [place], [address] Phone: [phone]   Fax:  [phone]  Name: TOMI PADDOCK MRN: [id number] Date of Birth: 1960/06/21

## 2016-10-13 ENCOUNTER — Ambulatory Visit: Payer: 59 | Admitting: Physical Therapy

## 2016-10-20 ENCOUNTER — Encounter: Payer: Self-pay | Admitting: Neurology

## 2016-10-20 ENCOUNTER — Telehealth: Payer: Self-pay | Admitting: *Deleted

## 2016-10-20 NOTE — Telephone Encounter (Signed)
Spoke to pt and he is out of work since 09-27-16.  He is currently getting PT/OT.  Pt thru work is eligible for 10wks full pay from 09-27-16.  He is approved thru 10-25-16, and tentative thru 12/14/16.  He states that Neuro rehab will also be getting p/w from [employer] (OT) [facility].  He states vertigo is 50% improved, and numbness 0% improved.  He gets fatigued easily (after 4 hours).  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  STD form completed sent to Dr. Jaynee Eagles for review and completion.

## 2016-10-20 NOTE — Telephone Encounter (Signed)
Thanks will sign it Monday leave it for me I am inpatient today thanks

## 2016-10-23 ENCOUNTER — Ambulatory Visit: Payer: 59 | Admitting: Occupational Therapy

## 2016-10-23 ENCOUNTER — Encounter: Payer: Self-pay | Admitting: Occupational Therapy

## 2016-10-23 ENCOUNTER — Telehealth: Payer: Self-pay | Admitting: *Deleted

## 2016-10-23 DIAGNOSIS — R42 Dizziness and giddiness: Secondary | ICD-10-CM | POA: Diagnosis not present

## 2016-10-23 DIAGNOSIS — R278 Other lack of coordination: Secondary | ICD-10-CM

## 2016-10-23 DIAGNOSIS — R41842 Visuospatial deficit: Secondary | ICD-10-CM

## 2016-10-23 DIAGNOSIS — R2681 Unsteadiness on feet: Secondary | ICD-10-CM

## 2016-10-23 DIAGNOSIS — I69254 Hemiplegia and hemiparesis following other nontraumatic intracranial hemorrhage affecting left non-dominant side: Secondary | ICD-10-CM

## 2016-10-23 DIAGNOSIS — R29818 Other symptoms and signs involving the nervous system: Secondary | ICD-10-CM

## 2016-10-23 NOTE — Therapy (Signed)
[facility] [address] [place], [place], [address] Phone: [phone]   Fax:  [phone]  Occupational Therapy Treatment  Patient Details  Name: Todd Harris MRN: [id number] Date of Birth: 06-Aug-1960 Referring Provider: Dr Heide Spark  Encounter Date: 10/23/2016      OT End of Session - 10/23/16 1624    Visit Number 3   Number of Visits 16   Date for OT Re-Evaluation 12/05/16   Authorization Type [employer] 20 visits for OT and 20 visits for PT   OT Start Time 1447   OT Stop Time 1531   OT Time Calculation (min) 44 min   Activity Tolerance Patient tolerated treatment well      Past Medical History:  Diagnosis Date  . Anxiety   . Atrial arrhythmia   . Barrett esophagus   . Bipolar 1 disorder ([place])   . Cavernous angioma   . Depression   . History of chicken pox   . Hypertension   . Seizures ([place])    1 in 2005, 1 in 2016   . Sleep apnea   . Sleep apnea   . Stroke ([place]) 11/2014   No residual deficits  . Venous malformation    Brain    Past Surgical History:  Procedure Laterality Date  . APPENDECTOMY  1970   age 57  . bone spur     age 8  . kidney stones    . TONSILLECTOMY  1968    There were no vitals filed for this visit.      Subjective Assessment - 10/23/16 1447    Subjective  I still get early afternoon fatigue but I can go the whole day without a nap now.    Patient is accompained by: Family member  wife   Pertinent History ICH, h/o of multiple cavernomosas   Patient Stated Goals I want to be able to do normal daily functions, type on computer if possible., deal with computer efficiently, possibly driving.    Currently in Pain? No/denies                      OT Treatments/Exercises (OP) - 10/23/16 0001      Neurological Re-education Exercises   Other Exercises 1 Pt reports improved functional balance and less episodes of nausea since last visit. Eye alignment is  improved and pt has not had any episodes of true diplopia in over a week. Pt does however experience blurry vision as well as "shadow" of second image in midline and closer worse than farther away.  Pt given exercises for convergence as well as visual vestibular integration and gaze stabilization exercises - see pt instruction for details.  Pt able to return demonstrate all exercises.  Wife also present and able to assist prn.  See pt instructions for details.                  OT Education - 10/23/16 1620    Education provided Yes   Education Details visual vestibular exercises in sitting and standing   Person(s) Educated Patient;Spouse   Methods Explanation;Demonstration;Verbal cues;Handout   Comprehension Verbalized understanding;Returned demonstration          OT Short Term Goals - 10/23/16 1620      OT SHORT TERM GOAL #1   Title Pt will be mod I with HEP for visual vestibular, LUE coordination and strength - 11/07/2016   Status On-going     OT SHORT TERM  GOAL #2   Title Assess 9 hole peg and set goal as appropriate   Status Achieved     OT SHORT TERM GOAL #3   Title Pt will rate disequilibrium no higher than 5/10 with functional activities requiring head turns, bending over (i.e. home mgmt tasks)   Status On-going     OT SHORT TERM GOAL #4   Title Pt will report no episodes of diplopia when reading   Status Achieved     OT SHORT TERM GOAL #5   Title Pt will report greater ease in buttoning shirt with L hand   Status Achieved     OT SHORT TERM GOAL #6   Title Pt will demonstrate improved LUE coordinaton as evidenced by decreasing time on 9 hole peg by at least 5 seconds to aide in functional activity (baseline=42.10)   Status On-going           OT Long Term Goals - 10/23/16 1622      OT LONG TERM GOAL #1   Title Pt will be mod I with upgraded HEP - 12/05/2016   Status On-going     OT LONG TERM GOAL #2   Title Pt will demostrate ability to lift 6 pound  object x3 with LUE to overhead shelf without dropping.   Status On-going     OT LONG TERM GOAL #3   Title Pt will rate disequilibrium sensation 3/10 or less with home mgmt tasks that require head turns, head nods, full body turns and bending over.    Status On-going     OT LONG TERM GOAL #4   Title Pt will demonstrate ability to manuever on uneven outdoor surfaces with transitonal head/eye movement as prep to return to yard work with disequilibrium rated no more than 3/10.   Status On-going     OT LONG TERM GOAL #5   Title Pt will demonstrate improved coordination LUE as evidenced by decreasing time on 9 hole peg by 8 seconds to aide in functional activity (baseline= 42.10)   Status On-going               Plan - 10/23/16 1622    Clinical Impression Statement Pt progressing toward goals. Pt reports he is less symtomatic with functional mobility and that UE coordination is also improving.    Rehab Potential Good   OT Frequency 2x / week   OT Duration 8 weeks   OT Treatment/Interventions Self-care/ADL training;Therapeutic exercise;Neuromuscular education;Therapist, nutritional;Therapeutic activities;Visual/perceptual remediation/compensation;Patient/family education;Balance training;DME and/or AE instruction   Plan check HEP, motion sensitivite assessment, address balance via functional tasks with emphasis on head turns, body turns, transitional movements, uneven sufaces and decreasing wide base of support. balance reactions   Consulted and Agree with Plan of Care Patient;Family member/caregiver   Family Member Consulted wife [name]      Patient will benefit from skilled therapeutic intervention in order to improve the following deficits and impairments:  Abnormal gait, Decreased activity tolerance, Decreased balance, Decreased coordination, Decreased mobility, Decreased knowledge of use of DME, Decreased strength, Difficulty walking, Impaired UE functional use, Impaired  sensation, Impaired vision/preception  Visit Diagnosis: Visuospatial deficit  Unsteadiness on feet  Other lack of coordination  Other symptoms and signs involving the nervous system  Hemiplegia and hemiparesis following other nontraumatic intracranial hemorrhage affecting left non-dominant side [facility])    Problem List Patient Active Problem List   Diagnosis Date Noted  . Cerebral cavernous malformation 09/28/2016  . Seizures ([place]) 09/28/2016  . Keratoma 09/28/2016  .  ICH (intracerebral hemorrhage) ([place]) 09/27/2016  . Hemangioma of skin and subcutaneous tissue 07/07/2016  . Visit for preventive health examination 07/07/2016  . Prostate cancer screening 07/07/2016  . Obstructive sleep apnea 12/23/2015  . Arteriovenous malformation of brain 12/06/2015  . Cavernous hemangioma of brain ([place]) 12/30/2014  . Bipolar disorder ([place]) 12/30/2014  . HTN (hypertension) 12/30/2014    Quay Burow , OTR/L 10/23/2016, 4:24 PM  [facility] [address] [place], [place], [address] Phone: [phone]   Fax:  [phone]  Name: Todd Harris MRN: [id number] Date of Birth: 04/09/1960

## 2016-10-23 NOTE — Patient Instructions (Addendum)
Visual vestibular exercises:  Do these 3-4 times per day.  Do not let your symptoms get more than a 4-5/10.  Make sure all symptoms resolve before you do the next repetition.  1. Stand facing the wall with your target at eye level.  Fix your eyes on the target and tilt your chin down, keeping your eyes on the target. Quickly tilt your head from side to side, keeping your eyes on the target and your chin tucked. Do 5 and then stop, keeping eyes on the target. When all symptoms are gone, repeat. Do these 4 times.  2.  Stand in a corner with a chair in front of you. Make sure you are not touching the wall or chair.  Hold the paper with the word "house" on it at arm's length.  Move the word from side to side, following it with both your eyes and your head.  You will feel off balance and that is ok - that is what we are looking for.  KEEP THE INTENSITY AT 4-5/10 (you can control this by the speed in which you move the paper and the number of repetitions).  Do 5. Stop and wait for all symptoms to completely pass. Then repeat. Do 4 repetitions.   2. Sit in a chair with a target on the wall at eye level. You want the target to be slightly fuzzy to start (beginning of second image).  Try and make it into one target and hold for at least a count of five.  Close your eyes and do again. Do this 5 times.  As this becomes easier, move your chair closer.  3.  Using your targets on a string, hold in front of your face. Start by looking at the far target, then quickly move your eyes to the near target. Hold the near target until it comes more into focus.  Then look quickly at the far target again. Once that one is clear, repeat.  The close target may not get totally clear and that is ok we are just looking for it to get clearer.

## 2016-10-23 NOTE — Telephone Encounter (Signed)
Gave completed/signed disability form to [facility], medical records. Pt in office now wanting to pick up forms. Dr Jaynee Eagles saw pt in hospital and agreed to fill out for patient.

## 2016-10-24 ENCOUNTER — Emergency Department (HOSPITAL_COMMUNITY)
Admission: EM | Admit: 2016-10-24 | Discharge: 2016-10-24 | Disposition: A | Discharge status: Home / Self Care | Payer: 59 | Attending: Emergency Medicine | Admitting: Emergency Medicine

## 2016-10-24 ENCOUNTER — Encounter: Payer: Self-pay | Admitting: Neurology

## 2016-10-24 ENCOUNTER — Ambulatory Visit: Payer: 59 | Admitting: Occupational Therapy

## 2016-10-24 ENCOUNTER — Encounter (HOSPITAL_COMMUNITY): Payer: Self-pay

## 2016-10-24 ENCOUNTER — Encounter: Payer: Self-pay | Admitting: Occupational Therapy

## 2016-10-24 ENCOUNTER — Emergency Department (HOSPITAL_COMMUNITY): Payer: 59

## 2016-10-24 VITALS — BP 120/78

## 2016-10-24 DIAGNOSIS — R531 Weakness: Secondary | ICD-10-CM | POA: Diagnosis not present

## 2016-10-24 DIAGNOSIS — R202 Paresthesia of skin: Secondary | ICD-10-CM | POA: Diagnosis not present

## 2016-10-24 DIAGNOSIS — I10 Essential (primary) hypertension: Secondary | ICD-10-CM | POA: Diagnosis not present

## 2016-10-24 DIAGNOSIS — R278 Other lack of coordination: Secondary | ICD-10-CM

## 2016-10-24 DIAGNOSIS — Q283 Other malformations of cerebral vessels: Secondary | ICD-10-CM | POA: Diagnosis not present

## 2016-10-24 DIAGNOSIS — Z79899 Other long term (current) drug therapy: Secondary | ICD-10-CM | POA: Insufficient documentation

## 2016-10-24 DIAGNOSIS — I639 Cerebral infarction, unspecified: Secondary | ICD-10-CM | POA: Insufficient documentation

## 2016-10-24 DIAGNOSIS — R42 Dizziness and giddiness: Secondary | ICD-10-CM | POA: Diagnosis not present

## 2016-10-24 DIAGNOSIS — R29818 Other symptoms and signs involving the nervous system: Secondary | ICD-10-CM

## 2016-10-24 DIAGNOSIS — I69254 Hemiplegia and hemiparesis following other nontraumatic intracranial hemorrhage affecting left non-dominant side: Secondary | ICD-10-CM

## 2016-10-24 DIAGNOSIS — R2681 Unsteadiness on feet: Secondary | ICD-10-CM

## 2016-10-24 DIAGNOSIS — R41842 Visuospatial deficit: Secondary | ICD-10-CM

## 2016-10-24 LAB — I-STAT CHEM 8, ED
BUN: 26 mg/dL — ABNORMAL HIGH (ref 6–20)
Calcium, Ion: 1.1 mmol/L — ABNORMAL LOW (ref 1.15–1.40)
Chloride: 101 mmol/L (ref 101–111)
Creatinine, Ser: 0.8 mg/dL (ref 0.61–1.24)
Glucose, Bld: 96 mg/dL (ref 65–99)
HEMATOCRIT: 45 % (ref 39.0–52.0)
HEMOGLOBIN: 15.3 g/dL (ref 13.0–17.0)
POTASSIUM: 5.4 mmol/L — AB (ref 3.5–5.1)
SODIUM: 137 mmol/L (ref 135–145)
TCO2: 31 mmol/L (ref 22–32)

## 2016-10-24 LAB — COMPREHENSIVE METABOLIC PANEL
ALBUMIN: 4.4 g/dL (ref 3.5–5.0)
ALT: 44 U/L (ref 17–63)
ANION GAP: 9 (ref 5–15)
AST: 22 U/L (ref 15–41)
Alkaline Phosphatase: 61 U/L (ref 38–126)
BILIRUBIN TOTAL: 0.7 mg/dL (ref 0.3–1.2)
BUN: 17 mg/dL (ref 6–20)
CHLORIDE: 101 mmol/L (ref 101–111)
CO2: 27 mmol/L (ref 22–32)
Calcium: 9.7 mg/dL (ref 8.9–10.3)
Creatinine, Ser: 0.74 mg/dL (ref 0.61–1.24)
GFR calc Af Amer: >60 mL/min (ref >60)
Glucose, Bld: 95 mg/dL (ref 65–99)
POTASSIUM: 4.1 mmol/L (ref 3.5–5.1)
Sodium: 137 mmol/L (ref 135–145)
TOTAL PROTEIN: 6.5 g/dL (ref 6.5–8.1)

## 2016-10-24 LAB — I-STAT TROPONIN, ED: TROPONIN I, POC: 0 ng/mL (ref 0.00–0.08)

## 2016-10-24 LAB — CBC
HCT: 44.2 % (ref 39.0–52.0)
Hemoglobin: 14.7 g/dL (ref 13.0–17.0)
MCH: 29.7 pg (ref 26.0–34.0)
MCHC: 33.3 g/dL (ref 30.0–36.0)
MCV: 89.3 fL (ref 78.0–100.0)
PLATELETS: 182 10*3/uL (ref 150–400)
RBC: 4.95 MIL/uL (ref 4.22–5.81)
RDW: 13.3 % (ref 11.5–15.5)
WBC: 10.1 10*3/uL (ref 4.0–10.5)

## 2016-10-24 LAB — DIFFERENTIAL
BASOS ABS: 0 10*3/uL (ref 0.0–0.1)
BASOS PCT: 0 %
EOS ABS: 0.7 10*3/uL (ref 0.0–0.7)
EOS PCT: 7 %
LYMPHS PCT: 20 %
Lymphs Abs: 2 10*3/uL (ref 0.7–4.0)
Monocytes Absolute: 0.5 10*3/uL (ref 0.1–1.0)
Monocytes Relative: 5 %
NEUTROS ABS: 6.9 10*3/uL (ref 1.7–7.7)
NEUTROS PCT: 68 %

## 2016-10-24 LAB — PROTIME-INR
INR: 0.9
PROTHROMBIN TIME: 12.1 s (ref 11.4–15.2)

## 2016-10-24 LAB — APTT: APTT: 28 s (ref 24–36)

## 2016-10-24 NOTE — Discharge Instructions (Signed)
The evaluation today indicates that you, may have had a slight extension of the bleeding, or evolution of the bleeding, as a dissection, of blood.  Since your symptoms are improving, you are stable, to go home and see the neurosurgeon tomorrow as planned.  Make sure you take your medications as usual, avoid stress, and rest.  Return here, if needed, for problems or worsening symptoms.

## 2016-10-24 NOTE — ED Provider Notes (Signed)
[facility] EMERGENCY DEPARTMENT Provider Note   CSN: [id number] Arrival date & time: 10/24/16  1618     History   Chief Complaint Chief Complaint  Patient presents with  . Dizziness    HPI Todd Harris is a 56 y.o. male.  He presents for evaluation of speech disorder, abnormal gait, dizziness, and numbness of his left arm.  He was doing well, improving from his recent stroke 1 week ago, when he awoke from a nap earlier today with trouble talking "like he had been drinking beers," according to his wife.  He subsequently went back to sleep, awoke later and went to rehab with his wife.  During the drive to rehab, he was at his baseline without difficulty speaking.  While at rehab, the therapist noted that he had slurred speech and trouble walking.  Also at that time he planes of dizziness.  The time of onset of the symptoms as are approximately 4 PM.  He was able to chat briefly to his neurologist, Dr. Lavell Anchors, who recommended that he come to the ED, which he did, by private vehicle.  Patient has been eating and drinking well.  He is taking his room fever, chills, cough, headache, neck pain or back pain.  There are no other known modifying factors.  HPI  Past Medical History:  Diagnosis Date  . Anxiety   . Atrial arrhythmia   . Barrett esophagus   . Bipolar 1 disorder ([place])   . Cavernous angioma   . Depression   . History of chicken pox   . Hypertension   . Seizures ([place])    1 in 2005, 1 in 2016   . Sleep apnea   . Sleep apnea   . Stroke ([place]) 11/2014   No residual deficits  . Venous malformation    Brain    Patient Active Problem List   Diagnosis Date Noted  . Cerebral cavernous malformation 09/28/2016  . Seizures ([place]) 09/28/2016  . Keratoma 09/28/2016  . ICH (intracerebral hemorrhage) ([place]) 09/27/2016  . Hemangioma of skin and subcutaneous tissue 07/07/2016  . Visit for preventive health examination 07/07/2016  . Prostate cancer screening  07/07/2016  . Obstructive sleep apnea 12/23/2015  . Arteriovenous malformation of brain 12/06/2015  . Cavernous hemangioma of brain ([place]) 12/30/2014  . Bipolar disorder ([place]) 12/30/2014  . HTN (hypertension) 12/30/2014    Past Surgical History:  Procedure Laterality Date  . APPENDECTOMY  1970   age 33  . bone spur     age 50  . kidney stones    . TONSILLECTOMY  1968       Home Medications    Prior to Admission medications   Medication Sig Start Date End Date Taking? Authorizing Provider  buPROPion (WELLBUTRIN XL) 150 MG 24 hr tablet Take 150 mg by mouth daily.   Yes [provider]  diazepam (VALIUM) 5 MG tablet Take 5 mg by mouth daily.  12/16/14  Yes [provider]  lisinopril-hydrochlorothiazide (PRINZIDE,ZESTORETIC) 20-25 MG tablet Take 1 tablet by mouth daily. 07/07/16  Yes Brunetta Jeans, PA-C  meclizine (ANTIVERT) 25 MG tablet Take 1 tablet (25 mg total) by mouth 3 (three) times daily as needed for dizziness. 09/30/16  Yes Rinehuls, Early Chars, PA-C    Family History Family History  Problem Relation Age of Onset  . Aneurysm Maternal Grandmother   . Asthma Mother   . Diabetes Mother   . Hypertension Mother   . Diabetes Brother   .  Diabetes Brother   . Pancreatic cancer Father   . Diabetes Father   . Cancer Father        Pancreatic  . Diabetes Paternal Grandmother   . Diabetes Paternal Grandfather   . Colon cancer Neg Hx     Social History Social History  Substance Use Topics  . Smoking status: Never Smoker  . Smokeless tobacco: Never Used  . Alcohol use 0.0 oz/week     Comment: 4-5 beers per week     Allergies   Patient has no known allergies.   Review of Systems Review of Systems  All other systems reviewed and are negative.    Physical Exam Updated Vital Signs BP 114/86   Pulse 61   Temp 97.7 F (36.5 C)   Resp 11   Ht 5\' 10"  (1.778 m)   Wt 83.5 kg (184 lb)   SpO2 100%   BMI 26.40 kg/m   Physical Exam    Constitutional: He is oriented to person, place, and time. He appears well-developed and well-nourished. No distress.  HENT:  Head: Normocephalic and atraumatic.  Right Ear: External ear normal.  Left Ear: External ear normal.  Eyes: Pupils are equal, round, and reactive to light. Conjunctivae and EOM are normal.  Neck: Normal range of motion and phonation normal. Neck supple.  Cardiovascular: Normal rate, regular rhythm and normal heart sounds.   Pulmonary/Chest: Effort normal and breath sounds normal. He exhibits no bony tenderness.  Abdominal: Soft. There is no tenderness.  Musculoskeletal: Normal range of motion.  Neurological: He is alert and oriented to person, place, and time. No cranial nerve deficit or sensory deficit. He exhibits normal muscle tone.  No dysarthria aphasia or nystagmus.  No pronator drift.  Gait, slightly wide-based, with very minimal ataxia.  Skin: Skin is warm, dry and intact.  Psychiatric: He has a normal mood and affect. His behavior is normal. Judgment and thought content normal.  Nursing note and vitals reviewed.    ED Treatments / Results  Labs (all labs ordered are listed, but only abnormal results are displayed) Labs Reviewed  I-STAT CHEM 8, ED - Abnormal; Notable for the following:       Result Value   Potassium 5.4 (*)    BUN 26 (*)    Calcium, Ion 1.10 (*)    All other components within normal limits  PROTIME-INR  APTT  CBC  DIFFERENTIAL  COMPREHENSIVE METABOLIC PANEL  POTASSIUM  I-STAT TROPONIN, ED  CBG MONITORING, ED    EKG  EKG Interpretation None       Radiology Ct Head Wo Contrast  Result Date: 10/24/2016 CLINICAL DATA:  Left-sided numbness and weakness. EXAM: CT HEAD WITHOUT CONTRAST TECHNIQUE: Contiguous axial images were obtained from the base of the skull through the vertex without intravenous contrast. COMPARISON:  Head CT 09/29/2016 FINDINGS: Brain: Known multiple cavernomas seen as high density partially calcified  masses without vasogenic edema. These are underestimated by CT. The largest are seen in the bilateral posterior cerebral white matter, right temporal occipital cortex, and left cerebellum. A cavernoma recently hemorrhage in the right pons. This clot has decreased in density centrally and has a new small finger-like projection extending to the midline floor of the fourth ventricle. The main portion of the hematoma is size stable at 14 mm. No intraventricular extension or complete fourth ventricular effacement. No evidence of infarct. Vascular: Atherosclerotic calcification.  No hyperdense vessel. Skull: No acute or aggressive finding. Sinuses/Orbits: Negative IMPRESSION: 1. Known recent right  pontine hemorrhage measuring 66mm. There is newly seen small volume clot dissecting towards the floor of the fourth ventricle. The main clot has partially liquified. No hydrocephalus or intraventricular blood. 2. Multiple cavernomas that are underestimated by CT. Electronically Signed   By: Monte Fantasia M.D.   On: 10/24/2016 17:50    Procedures .Critical Care Performed by: Daleen Bo Authorized by: Daleen Bo   Critical care provider statement:    Critical care time (minutes):  40   Critical care start time:  10/24/2016 5:05 PM   Critical care end time:  10/24/2016 8:53 PM   Critical care time was exclusive of:  Separately billable procedures and treating other patients   Critical care was necessary to treat or prevent imminent or life-threatening deterioration of the following conditions: CVA.   Critical care was time spent personally by me on the following activities:  Blood draw for specimens, development of treatment plan with patient or surrogate, discussions with consultants, evaluation of patient's response to treatment, examination of patient, obtaining history from patient or surrogate, ordering and performing treatments and interventions, ordering and review of laboratory studies, ordering and  review of radiographic studies, pulse oximetry, re-evaluation of patient's condition and review of old charts   I assumed direction of critical care for this patient from another provider in my specialty: no     (including critical care time)  Medications Ordered in ED Medications - No data to display   Initial Impression / Assessment and Plan / ED Course  I have reviewed the triage vital signs and the nursing notes.  Pertinent labs & imaging results that were available during my care of the patient were reviewed by me and considered in my medical decision making (see chart for details).      Patient Vitals for the past 24 hrs:  BP Temp Temp src Pulse Resp SpO2 Height Weight  10/24/16 2030 114/86 - - 61 11 100 % - -  10/24/16 2000 107/81 - - 61 12 98 % - -  10/24/16 1930 108/81 - - 73 17 100 % - -  10/24/16 1906 - 97.7 F (36.5 C) - - - - - -  10/24/16 1900 113/84 - - 65 12 99 % - -  10/24/16 1830 112/84 - - 66 13 99 % - -  10/24/16 1800 117/79 - - 64 11 99 % - -  10/24/16 1702 132/88 97.7 F (36.5 C) Oral 68 16 100 % 5\' 10"  (1.778 m) 83.5 kg (184 lb)    8:49 PM Reevaluation with update and discussion. After initial assessment and treatment, an updated evaluation reveals patient states he has recovered his baseline, at this time.  The patient has been seen by the neuro hospitalist, who feels that he is stable to go home with his neurosurgical appointment tomorrow as scheduled.  He is due to go to [facility], tomorrow, to be evaluated for gamma knife surgery, for corrective repair of intracranial AVM.  Findings discussed with patient and family members, all questions answered. Lateasha Breuer L      Final Clinical Impressions(s) / ED Diagnoses   Final diagnoses:  Cerebrovascular accident (CVA), unspecified mechanism ([place])  Dizziness  Paresthesia   Recent CVA, with recurrent symptoms, improved during evaluation.  Possible slight extension of blood existing,  versus additional oozing from the cavernous AVM.  Patient has short-term follow-up scheduled for tomorrow with neurosurgery, for consideration of gamma knife procedure, to treat the vascular abnormality.  Vital signs are reassuring.  Patient is comfortable with discharge at this time.  Nursing Notes Reviewed/ Care Coordinated Applicable Imaging Reviewed Interpretation of Laboratory Data incorporated into ED treatment  The patient appears reasonably screened and/or stabilized for discharge and I doubt any other medical condition or other [facility] requiring further screening, evaluation, or treatment in the ED at this time prior to discharge.  Plan: Home Medications-continue current medications; Home Treatments-rest, fluids; return here if the recommended treatment, does not improve the symptoms; Recommended follow up-PCP as needed.  Neurosurgery tomorrow as scheduled.  New Prescriptions New Prescriptions   No medications on file     Daleen Bo, MD 10/24/16 2100

## 2016-10-24 NOTE — ED Notes (Signed)
Requested disc from CT scan

## 2016-10-24 NOTE — ED Triage Notes (Signed)
Pt arrives to ED from neurologist for CT scan to be evaluated for re-bleed from hemorrhagic pontine stroke 9/26. Pt reports severe ringing in left ear, dizziness, unsteady gait, increased left sided numbness, nausea, and wife reported symptoms at about 1:30 this afternoon. At this time pt has no left sided weakness, facial droop, or slurred speech. PT reports he is still very "Swimmyheaded". Dr. Eulis Foster in triage.

## 2016-10-24 NOTE — Consult Note (Signed)
Neurology Consultation Reason for Consult: Weakness, dizziness Referring Physician: Dr Eulis Foster    History is obtained from:Patient and chart review  HPI: Todd Harris is a 56 y.o. male history of multiple cavernomas, bipolar disorder, Barrett's esophagus, seizures, atrial arythmia, hypertension, sleep apnea, and prior stroke in 2016 recently discharged from [facility] stroke service after presenting with hemorrhage from a right pontine cavernoma. He has residual left hemiparesis, hemisensory symptoms. Today around 4 pm he noticed he has slurred speech and difficulty walking. He spoke to his neurologist Dr Jaynee Eagles who recommended he come to the ER. His symptoms have resolved on arrival and patient feels back to his new baseline from the recent hemorrhage. His blood pressure was 937 systolic in the ER.    ROS: A 14 point ROS was performed and is negative except as noted in the HPI.   Past Medical History:  Diagnosis Date  . Anxiety   . Atrial arrhythmia   . Barrett esophagus   . Bipolar 1 disorder ([place])   . Cavernous angioma   . Depression   . History of chicken pox   . Hypertension   . Seizures ([place])    1 in 2005, 1 in 2016   . Sleep apnea   . Sleep apnea   . Stroke ([place]) 11/2014   No residual deficits  . Venous malformation    Brain     Family History  Problem Relation Age of Onset  . Aneurysm Maternal Grandmother   . Asthma Mother   . Diabetes Mother   . Hypertension Mother   . Diabetes Brother   . Diabetes Brother   . Pancreatic cancer Father   . Diabetes Father   . Cancer Father        Pancreatic  . Diabetes Paternal Grandmother   . Diabetes Paternal Grandfather   . Colon cancer Neg Hx      Social History:  reports that he has never smoked. He has never used smokeless tobacco. He reports that he drinks alcohol. He reports that he does not use drugs.   Exam: Current vital signs: BP 114/84   Pulse 75   Temp 97.7 F (36.5 C)   Resp (!) 9   Ht 5\' 10"  (1.778 m)   Wt  83.5 kg (184 lb)   SpO2 99%   BMI 26.40 kg/m  Vital signs in last 24 hours: Temp:  [97.7 F (36.5 C)] 97.7 F (36.5 C) (10/23 1906) Pulse Rate:  [61-75] 75 (10/23 2100) Resp:  [9-17] 9 (10/23 2100) BP: (107-132)/(78-88) 114/84 (10/23 2100) SpO2:  [98 %-100 %] 99 % (10/23 2100) Weight:  [83.5 kg (184 lb)] 83.5 kg (184 lb) (10/23 1702)   Physical Exam  Constitutional: Appears well-developed and well-nourished.  Psych: Affect appropriate to situation Eyes: No scleral injection HENT: No OP obstrucion Head: Normocephalic.  Cardiovascular: Normal rate and regular rhythm.  Respiratory: Effort normal and breath sounds normal to anterior ascultation GI: Soft.  No distension. There is no tenderness.  Skin: WDI  Neuro: Mental Status: Patient is awake, alert, oriented to person, place, month, year, and situation. Patient is able to give a clear and coherent history. No signs of aphasia or neglect Cranial Nerves: II: Visual Fields are full. Pupils are equal, round, and reactive to light.  III,IV, VI: EOMI without ptosis or diploplia.  V: Facial sensation is symmetric to temperature VII: Facial movement is symmetric.  VIII: hearing is intact to voice X: Uvula elevates symmetrically XI: Shoulder shrug is symmetric.  XII: tongue is midline without atrophy or fasciculations.  Motor: Tone is normal. Bulk is normal. 4-/5 strength in left arm and left leg.  Sensory: Sensation is decreased to light touch and temperature in the face, arms and legs on left side Deep Tendon Reflexes: 2+ and symmetric in the biceps and patellae.  Plantars: Toes are downgoing bilaterally.  Cerebellar: FNF ataxic on left side      ASSESSMENT AND PLAN  Mr. Diana is a 56 year old male past medical history of multiple cavernoma with a recent right pontine hemorrhage presence the emergency room with transient slurred speech and difficulty walking that began around 4 PM and has now resolved. A stat CT head was  obtained in the emergency room shows a small new hemorrhage at the same site of his pontine cavernoma adjacent to this recent hemorrhage. This may represent mild expansion vs evolution of hemorrhage from the patient's pontine cavernoma No new hemorrhage in any other cavernoma site.  Recommendations  Patient has evaluation tomorrow at [facility] with neurosurgery for stereotactic surgery of right pontine cavernoma. Given new findings on CT and new clinical symptoms, this would favor surgical intervention of patient's cavernoma. Offered admission for observation, however patient and family declined. There is low risk massive expansion, there is no intraventricular hemorrhage.Blood pressure has been consistently below 140/ 90.  Admission would not change current management which would be addressing treatment of the pontine cavernoma. Counseled on need to come to the emergency room the patient experiences any new neurological symptoms.

## 2016-10-24 NOTE — ED Notes (Signed)
Pt going to CT from the waiting room. Pt will be transported back to the room, after CT.

## 2016-10-24 NOTE — ED Notes (Signed)
Pt stable,ambulatory, and verbalizes understanding of D/C instructions.   

## 2016-10-24 NOTE — ED Provider Notes (Signed)
MSE was initiated and I personally evaluated the patient and placed orders (if any) at  5:09 PM on October 24, 2016.  He presents for evaluation of speech disorder, abnormal gait, dizziness, and numbness of his left arm.  He was doing well, improving from his recent stroke 1 week ago, when he awoke from a nap earlier today with trouble talking "like he had been drinking beers," according to his wife.  He subsequently went back to sleep, awoke later and went to rehab with his wife.  During the drive to rehab, he was at his baseline without difficulty speaking.  While at rehab, the therapist noted that he had slurred speech and trouble walking.  Also at that time he planes of dizziness.  The time of onset of the symptoms as are approximately 4 PM.  He was able to chat briefly to his neurologist, Dr. Lavell Anchors, who recommended that he come to the ED, which he did, by private vehicle.  Exam-alert, lucid, cooperative.  He is able to walk with a slightly wide-based gait.  There is no evident dysarthria or aphasia.  There is no pronator drift.  No apparent ataxia.  Assessment-partial recurrence of symptoms, associated with recent stroke, possibly rebleed, versus extension of stroke versus edema.  Patient will require repeat imaging, general evaluation, and reassessment by neurology   Patient Vitals for the past 24 hrs:  BP Temp Temp src Pulse Resp SpO2 Height Weight  10/24/16 1702 132/88 97.7 F (36.5 C) Oral 68 16 100 % 5\' 10"  (1.778 m) 83.5 kg (184 lb)   5:15 PM-we will request callback from neurology to obtain consultation in the emergency department.  Case discussed with neurology, who will evaluate the patient after CT imaging.      The patient appears stable so that the remainder of the MSE may be completed by another provider.   Daleen Bo, MD 10/24/16 [phone]

## 2016-10-24 NOTE — Therapy (Signed)
[facility] [address] [place], [place], [address] Phone: [phone]   Fax:  [phone]  Occupational Therapy Treatment  Patient Details  Name: TABIUS ROOD MRN: [id number] Date of Birth: 1960/03/04 Referring Provider: Dr Heide Spark  Encounter Date: 10/24/2016      OT End of Session - 10/24/16 1610    Visit Number 3   Number of Visits 16   Date for OT Re-Evaluation 12/05/16   Authorization Type [employer] 20 visits for OT and 20 visits for PT   OT Start Time 1532   OT Stop Time 1605  session terminated due to pt not feeling well   OT Time Calculation (min) 33 min   Activity Tolerance Treatment limited secondary to medical complications (Comment)      Past Medical History:  Diagnosis Date  . Anxiety   . Atrial arrhythmia   . Barrett esophagus   . Bipolar 1 disorder ([place])   . Cavernous angioma   . Depression   . History of chicken pox   . Hypertension   . Seizures ([place])    1 in 2005, 1 in 2016   . Sleep apnea   . Sleep apnea   . Stroke ([place]) 11/2014   No residual deficits  . Venous malformation    Brain    Past Surgical History:  Procedure Laterality Date  . APPENDECTOMY  1970   age 40  . bone spur     age 72  . kidney stones    . TONSILLECTOMY  1968    Vitals:   10/24/16 1532  BP: 120/78        Subjective Assessment - 10/24/16 1532    Subjective  I did my eye exercises today.  I think I did pretty well. I feel very tired this afternoon   Patient is accompained by: Family member  wife   Pertinent History ICH, h/o of multiple caver[place]osas   Patient Stated Goals I want to be able to do normal daily functions, type on computer if possible., deal with computer efficiently, possibly driving.    Currently in Pain? Yes   Pain Score 2    Pain Location Head  right behind my eyes   Pain Descriptors / Indicators Aching   Pain Type Acute pain   Pain Onset Today   Pain Frequency Constant   Aggravating Factors  not sure (pt did exercises but then was very active during the morning so that may have instigated headache)   Pain Relieving Factors nothing really it's only a 2/10. I feel more tired than the headache bothering me         Tx:  Pt arrived today and reported that he felt fine this morning and then all of sudden at around 12:30 felt very fatigued and had to lay down.  Pt also began with headache at that time.  Wife reports that pt's speech was slurred at that time as well however pt appeared unaware of change in speech.  BP today 120/78.  Pt initially stated that he felt like he could work on some table top activities however as session went on, pt began to feel low grade nausea and stated that his hearing/tinnitus in L ear was getting progressively worse.  Quickly reassessed balance and pt with noticeable change in balance today from the past week.  Recommend to pt and wife that given pt's medical history that pt seek medical assistance. Pt and wife wanted to first go next  door to try and see Dr. Jaynee Eagles before just heading to ED.  Note via inbasket sent to Dr. Jaynee Eagles and session was terminated.                       OT Education - 10/24/16 1606    Education provided Yes   Education Details warning signs and symptoms of storke as well as risk factors   Person(s) Educated Patient;Spouse   Methods Explanation;Handout   Comprehension Verbalized understanding          OT Short Term Goals - 10/24/16 1607      OT SHORT TERM GOAL #1   Title Pt will be mod I with HEP for visual vestibular, LUE coordination and strength - 11/07/2016   Status On-going     OT SHORT TERM GOAL #2   Title Assess 9 hole peg and set goal as appropriate   Status Achieved     OT SHORT TERM GOAL #3   Title Pt will rate disequilibrium no higher than 5/10 with functional activities requiring head turns, bending over (i.e. home mgmt tasks)   Status On-going     OT SHORT TERM GOAL #4    Title Pt will report no episodes of diplopia when reading   Status Achieved     OT SHORT TERM GOAL #5   Title Pt will report greater ease in buttoning shirt with L hand   Status Achieved     OT SHORT TERM GOAL #6   Title Pt will demonstrate improved LUE coordinaton as evidenced by decreasing time on 9 hole peg by at least 5 seconds to aide in functional activity (baseline=42.10)   Status On-going           OT Long Term Goals - 10/24/16 1607      OT LONG TERM GOAL #1   Title Pt will be mod I with upgraded HEP - 12/05/2016   Status On-going     OT LONG TERM GOAL #2   Title Pt will demostrate ability to lift 6 pound object x3 with LUE to overhead shelf without dropping.   Status On-going     OT LONG TERM GOAL #3   Title Pt will rate disequilibrium sensation 3/10 or less with home mgmt tasks that require head turns, head nods, full body turns and bending over.    Status On-going     OT LONG TERM GOAL #4   Title Pt will demonstrate ability to manuever on uneven outdoor surfaces with transitonal head/eye movement as prep to return to yard work with disequilibrium rated no more than 3/10.   Status On-going     OT LONG TERM GOAL #5   Title Pt will demonstrate improved coordination LUE as evidenced by decreasing time on 9 hole peg by 8 seconds to aide in functional activity (baseline= 42.10)   Status On-going               Plan - 10/24/16 1608    Clinical Impression Statement Pt arrived today and reported that he felt much more tired all of a sudden at 12:30.     Rehab Potential Good   OT Frequency 2x / week   OT Duration 8 weeks   OT Treatment/Interventions Self-care/ADL training;Therapeutic exercise;Neuromuscular education;Therapist, nutritional;Therapeutic activities;Visual/perceptual remediation/compensation;Patient/family education;Balance training;DME and/or AE instruction   Plan motion sensitivity assessment if able, address baalnce via functional tasks with  emphasis on head turns, body turns, transitonal movements, uneven surfaces and decreasign wide  BOS, balance reactions   Consulted and Agree with Plan of Care Patient;Family member/caregiver   Family Member Consulted wife [name]      Patient will benefit from skilled therapeutic intervention in order to improve the following deficits and impairments:  Abnormal gait, Decreased activity tolerance, Decreased balance, Decreased coordination, Decreased mobility, Decreased knowledge of use of DME, Decreased strength, Difficulty walking, Impaired UE functional use, Impaired sensation, Impaired vision/preception  Visit Diagnosis: Visuospatial deficit  Unsteadiness on feet  Other lack of coordination  Other symptoms and signs involving the nervous system  Hemiplegia and hemiparesis following other nontraumatic intracranial hemorrhage affecting left non-dominant side [facility])    Problem List Patient Active Problem List   Diagnosis Date Noted  . Cerebral cavernous malformation 09/28/2016  . Seizures ([place]) 09/28/2016  . Keratoma 09/28/2016  . ICH (intracerebral hemorrhage) ([place]) 09/27/2016  . Hemangioma of skin and subcutaneous tissue 07/07/2016  . Visit for preventive health examination 07/07/2016  . Prostate cancer screening 07/07/2016  . Obstructive sleep apnea 12/23/2015  . Arteriovenous malformation of brain 12/06/2015  . Cavernous hemangioma of brain ([place]) 12/30/2014  . Bipolar disorder ([place]) 12/30/2014  . HTN (hypertension) 12/30/2014    Quay Burow, OTR/L 10/24/2016, 4:11 PM  [facility] [address] [place], [place], [address] Phone: [phone]   Fax:  [phone]  Name: REMINGTON HIGHBAUGH MRN: [id number] Date of Birth: 1960/11/11

## 2016-10-25 ENCOUNTER — Ambulatory Visit: Payer: 59 | Admitting: Rehabilitative and Restorative Service Providers"

## 2016-10-25 ENCOUNTER — Encounter: Payer: Self-pay | Admitting: Neurology

## 2016-10-25 DIAGNOSIS — Z0289 Encounter for other administrative examinations: Secondary | ICD-10-CM

## 2016-10-25 DIAGNOSIS — Q283 Other malformations of cerebral vessels: Secondary | ICD-10-CM | POA: Diagnosis not present

## 2016-10-26 ENCOUNTER — Ambulatory Visit: Payer: 59 | Admitting: Rehabilitative and Restorative Service Providers"

## 2016-10-26 DIAGNOSIS — R2689 Other abnormalities of gait and mobility: Secondary | ICD-10-CM

## 2016-10-26 DIAGNOSIS — R42 Dizziness and giddiness: Secondary | ICD-10-CM | POA: Diagnosis not present

## 2016-10-26 DIAGNOSIS — R2681 Unsteadiness on feet: Secondary | ICD-10-CM

## 2016-10-26 DIAGNOSIS — R29818 Other symptoms and signs involving the nervous system: Secondary | ICD-10-CM

## 2016-10-26 NOTE — Patient Instructions (Signed)
Feet Together, Varied Arm Positions - Eyes Closed    Stand with feet together and arms at your side.  Close eyes and visualize upright position. Hold __20-30__ seconds. Repeat _3___ times per session. Do __2__ sessions per day.  IF ALONE:  DO WITH FEET 2-3 INCHES APART AND WORK TOWARDS ALL THE WAY TOGETHER IF YOU HAVE HELP/FAMILY TO STAND BY: DO WITH FEET TOGETHER. Copyright  VHI. All rights reserved.   Feet Together, Head Motion - Eyes Open    With eyes open, feet together, move head slowly: up and down (about 20 degrees up, then down at slow pace), then side to side at slowed pace. Repeat __5__ times per session. Do __2__ sessions per day.  Copyright  VHI. All rights reserved.   Functional Quadriceps: Sit to Stand    Sit on edge of chair, feet flat on floor. Stand upright, extending knees fully. Repeat ___10_ times per set. Do __1__ sets per session. Do __1__ sessions per day.  TRY NOT TO USE HANDS  http://orth.exer.us/734   Copyright  VHI. All rights reserved.

## 2016-10-26 NOTE — Therapy (Signed)
[facility] [address] [place], [place], [address] Phone: [phone]   Fax:  [phone]  Physical Therapy Treatment  Patient Details  Name: Todd Harris MRN: [id number] Date of Birth: [birth year] Referring Provider: Dr. Sarina Ill  Encounter Date: 10/26/2016      PT End of Session - 10/26/16 1658    Visit Number 2   Number of Visits 17   Date for PT Re-Evaluation 12/09/16   Authorization Type [employer]   Authorization - Visit Number 2   Authorization - Number of Visits 20   PT Start Time 1022   PT Stop Time 1102   PT Time Calculation (min) 40 min   Equipment Utilized During Treatment Gait belt   Activity Tolerance Patient tolerated treatment well   Behavior During Therapy WFL for tasks assessed/performed      Past Medical History:  Diagnosis Date  . Anxiety   . Atrial arrhythmia   . Barrett esophagus   . Bipolar 1 disorder ([place])   . Cavernous angioma   . Depression   . History of chicken pox   . Hypertension   . Seizures ([place])    1 in 2005, 1 in 2016   . Sleep apnea   . Sleep apnea   . Stroke ([place]) 11/2014   No residual deficits  . Venous malformation    Brain    Past Surgical History:  Procedure Laterality Date  . APPENDECTOMY  1970   age 3  . bone spur     age 9  . kidney stones    . TONSILLECTOMY  1968    There were no vitals filed for this visit.      Subjective Assessment - 10/26/16 1023    Subjective The patient reports that he was seen 10/24/16 at ED after presenting to OT with increased headache, increased L ear ringing, and slurred speech.  He notes that he was seen at ED and CT revealed further bleed of clot.  He feels that L sided sensation changes worse, fatigue worse and ear ringing worse.    Patient was evaluated by neurosurgeon yesterday and plans to undergo gamma knife procedure 12/19/16.   Patient is accompained by: --  brother dropped off for therapy visit   Patient  Stated Goals "Be able to cope with dizziness and walk downstairs without feeling like I'm going to fall; be able to not have to take the Meclizine"   Currently in Pain? No/denies  "no pain, just sluggish"            [facility] PT Assessment - 10/26/16 1028      Balance   Balance Assessed Yes     Static Sitting Balance   Static Sitting - Comment/# of Minutes Patient c/o sensation that someone is tugging at him pulling him forward.     Standardized Balance Assessment   Standardized Balance Assessment Berg Balance Test;Dynamic Gait Index     Berg Balance Test   Sit to Stand Able to stand without using hands and stabilize independently   Standing Unsupported Able to stand safely 2 minutes   Sitting with Back Unsupported but Feet Supported on Floor or Stool Able to sit safely and securely 2 minutes   Stand to Sit Sits safely with minimal use of hands   Transfers Able to transfer safely, minor use of hands   Standing Unsupported with Eyes Closed Able to stand 10 seconds safely   Standing Ubsupported with Feet Together Able to  place feet together independently and stand for 1 minute with supervision   From Standing, Reach Forward with Outstretched Arm Loses balance while trying/requires external support   From Standing Position, Pick up Object from [place] to pick up shoe safely and easily   From Standing Position, Turn to Look Behind Over each Shoulder Needs supervision when turning   Turn 360 Degrees Needs close supervision or verbal cueing  L side dizziness   Standing Unsupported, Alternately Place Feet on Step/Stool Able to complete >2 steps/needs minimal assist   Standing Unsupported, One Foot in [place] balance while stepping or standing   Standing on One Leg Able to lift leg independently and hold equal to or more than 3 seconds   Total Score 36   Berg comment: 36/56 indicating high fall risk     Dynamic Gait Index   Level Surface Mild Impairment  wide base of support    Change in Gait Speed Mild Impairment   Gait with Horizontal Head Turns Moderate Impairment   Gait with Vertical Head Turns Mild Impairment   Gait and Pivot Turn Moderate Impairment   Step Over Obstacle Mild Impairment   Step Around Obstacles Mild Impairment   Steps Mild Impairment   Total Score 14   DGI comment: 14/24 indicating high fall risk                     [facility] Adult PT Treatment/Exercise - 10/26/16 1030      Neuro Re-ed    Neuro Re-ed Details  PT emphasized narrowing base of support for corner standing activities working on eyes open + eyes closed, added eyes open + head motion.  *Patient expresses concern regarding head motion and notes wanting to maintain a guarded neck position.  PT discussed only performing slow, gentle head motion at this time.                 PT Education - 10/26/16 1657    Education provided Yes   Education Details Added:  feet together, eyes closed and feet together + head motion to HEP. Emphasized slow/comfortable speed of head movement   Person(s) Educated Patient   Methods Explanation;Demonstration;Handout   Comprehension Verbalized understanding;Returned demonstration          PT Short Term Goals - 10/11/16 1604      PT SHORT TERM GOAL #1   Title Improve TUG score from 14.53 to </= 10 secs without device with c/o dizziness </= 2/10 intensity.   Baseline 14.53 secs = vertigo 4/10 intensity   Time 4   Period Weeks   Status New   Target Date 11/10/16     PT SHORT TERM GOAL #2   Title Increase gait velocity from 2.56 ft/sec to >/= 3.0 ft/sec for incr. gait efficiency.   Baseline 12.58 secs = 2.56 ft/sec   Time 4   Period Weeks   Status New   Target Date 11/10/16     PT SHORT TERM GOAL #3   Title Perform DVA test and establish goal as appropriate.   Baseline unable to tolerate testing due to c/o severe vertigo with head movement   Time 4   Period Weeks   Status New   Target Date 11/10/16     PT SHORT TERM  GOAL #4   Title Complete Berg test and set goal as appropriate.   Time 4   Period Weeks   Status New   Target Date 11/10/16  PT SHORT TERM GOAL #5   Title Negotiate steps with 1 hand rail using a step over step sequence with dizziness </= 2/10   Time 4   Period Weeks   Status New   Target Date 11/10/16     Additional Short Term Goals   Additional Short Term Goals Yes     PT SHORT TERM GOAL #6   Title Complete SOT and establish goal as appropriate   Time 4   Period Weeks   Status New   Target Date 11/10/16     PT SHORT TERM GOAL #7   Title Independent in HEP for balance and vestibular exercises.   Time 4   Period Weeks   Status New   Target Date 11/10/16           PT Long Term Goals - 10/11/16 1612      PT LONG TERM GOAL #1   Title Increase gait velocity to >/= 3.5 ft/sec for increased gait efficiency.   Baseline 2.56 ft/sec on 10-10-16   Time 8   Period Weeks   Status New   Target Date 12/09/16     PT LONG TERM GOAL #2   Title Increase Berg score to at least 50/56 to decrease fall risk.   Time 8   Period Weeks   Status New   Target Date 12/09/16     PT LONG TERM GOAL #3   Title Increase DGI to at least 19/24 to reduce fall risk.   Time 8   Period Weeks   Status New   Target Date 12/09/16     PT LONG TERM GOAL #4   Title Improve DVA to </= 3 line difference.     Baseline unable to tolerate testing on 10-10-16 due to severity of vertigo   Time 8   Period Weeks   Status New   Target Date 12/09/16     PT LONG TERM GOAL #5   Title Report at least 75% improvement in vertigo.   Time 8   Period Weeks   Status New   Target Date 12/09/16     Additional Long Term Goals   Additional Long Term Goals Yes     PT LONG TERM GOAL #6   Title Independent in updated HEP as appropriate.   Time 8   Period Weeks   Status New   Target Date 12/09/16               Plan - 10/26/16 1705    Clinical Impression Statement The patient presents today  noting worsening fatigue, ear ringing and sensory changes since increased bleeding in his brain.  He is scheduled to undergo gamma knife 12/2016.  PT to emphasize balance training within tolerance, gait training for endurance/activity tolerance, as well as balance.   Continue to progress activities to tolerance working towards established STGs/LTGs.    PT Treatment/Interventions ADLs/Self Care Home Management;Neuromuscular re-education;Balance training;Therapeutic exercise;Therapeutic activities;Stair training;Gait training;Patient/family education;Vestibular;Visual/perceptual remediation/compensation   PT Next Visit Plan Check HEP; slow gaze x 1 viewing -- consider adding for HEP; gait training for endurnace + balance (emphasize narrowing base of support)   Consulted and Agree with Plan of Care Patient;Family member/caregiver      Patient will benefit from skilled therapeutic intervention in order to improve the following deficits and impairments:  Abnormal gait, Decreased activity tolerance, Decreased balance, Decreased coordination, Decreased endurance, Decreased strength, Dizziness, Impaired vision/preception, Impaired sensation  Visit Diagnosis: Unsteadiness on feet  Other symptoms and signs  involving the nervous system  Other abnormalities of gait and mobility     Problem List Patient Active Problem List   Diagnosis Date Noted  . Cerebral cavernous malformation 09/28/2016  . Seizures ([place]) 09/28/2016  . Keratoma 09/28/2016  . ICH (intracerebral hemorrhage) ([place]) 09/27/2016  . Hemangioma of skin and subcutaneous tissue 07/07/2016  . Visit for preventive health examination 07/07/2016  . Prostate cancer screening 07/07/2016  . Obstructive sleep apnea 12/23/2015  . Arteriovenous malformation of brain 12/06/2015  . Cavernous hemangioma of brain ([place]) 12/30/2014  . Bipolar disorder ([place]) 12/30/2014  . HTN (hypertension) 12/30/2014    Keyoni Lapinski, PT 10/26/2016, 5:07  PM  [facility] [address] [place], [place], [address] Phone: [phone]   Fax:  [phone]  Name: Todd Harris MRN: [id number] Date of Birth: 01-Apr-1960

## 2016-10-31 ENCOUNTER — Ambulatory Visit: Payer: 59 | Admitting: Occupational Therapy

## 2016-10-31 ENCOUNTER — Encounter: Payer: Self-pay | Admitting: Occupational Therapy

## 2016-10-31 ENCOUNTER — Ambulatory Visit: Payer: 59 | Admitting: Physical Therapy

## 2016-10-31 VITALS — BP 119/76

## 2016-10-31 DIAGNOSIS — R29818 Other symptoms and signs involving the nervous system: Secondary | ICD-10-CM

## 2016-10-31 DIAGNOSIS — R41842 Visuospatial deficit: Secondary | ICD-10-CM

## 2016-10-31 DIAGNOSIS — R42 Dizziness and giddiness: Secondary | ICD-10-CM | POA: Diagnosis not present

## 2016-10-31 DIAGNOSIS — R2689 Other abnormalities of gait and mobility: Secondary | ICD-10-CM

## 2016-10-31 DIAGNOSIS — I69254 Hemiplegia and hemiparesis following other nontraumatic intracranial hemorrhage affecting left non-dominant side: Secondary | ICD-10-CM

## 2016-10-31 DIAGNOSIS — R278 Other lack of coordination: Secondary | ICD-10-CM

## 2016-10-31 DIAGNOSIS — R2681 Unsteadiness on feet: Secondary | ICD-10-CM

## 2016-10-31 NOTE — Therapy (Signed)
[facility] [address] [place], [place], [address] Phone: [phone]   Fax:  [phone]  Occupational Therapy Treatment  Patient Details  Name: Todd Harris MRN: [id number] Date of Birth: 1960/03/15 Referring Provider: Dr Heide Spark  Encounter Date: 10/31/2016      OT End of Session - 10/31/16 1524    Visit Number 4   Number of Visits 16   Date for OT Re-Evaluation 12/05/16   Authorization Type [employer] 20 visits for OT and 20 visits for PT   OT Start Time 1401   OT Stop Time 1443   OT Time Calculation (min) 42 min   Activity Tolerance Patient tolerated treatment well      Past Medical History:  Diagnosis Date  . Anxiety   . Atrial arrhythmia   . Barrett esophagus   . Bipolar 1 disorder ([place])   . Cavernous angioma   . Depression   . History of chicken pox   . Hypertension   . Seizures ([facility])    1 in 2005, 1 in 2016   . Sleep apnea   . Sleep apnea   . Stroke ([facility]) 11/2014   No residual deficits  . Venous malformation    Brain    Past Surgical History:  Procedure Laterality Date  . APPENDECTOMY  1970   age 62  . bone spur     age 58  . kidney stones    . TONSILLECTOMY  1968    Vitals:   10/31/16 1411  BP: 119/76        Subjective Assessment - 10/31/16 1406    Subjective  I don't have any headaches just alot of fatigue.    Patient is accompained by: Family member  son Dorothea Ogle   Patient Stated Goals I want to be able to do normal daily functions, type on computer if possible., deal with computer efficiently, possibly driving.    Currently in Pain? No/denies                      OT Treatments/Exercises (OP) - 10/31/16 0001      ADLs   ADL Comments Pt reports that he is scheduled for surgery for gamma knife radiation.  Pt reports that he has not been given any precautions however email sent to neurologist to verify any precautions or activities to avoid until after surgery.   Pt aware and in agreement.      Neurological Re-education Exercises   Other Exercises 1 Neuro re ed to address visual vestibular integration in sitting and standing - pt more symptomatic since recent small bleed for gaze stabilization deficits (far greater than near) with horizontal head turns, head and eyes together, head turns with eyes closed. Convergence has improved.  Pt demonstrates more significant disequilibirum with functional ambulation. Re assessed coordination in L hand which has actually improved - see goal section for status and upgraded LTG goal.                    OT Short Term Goals - 10/31/16 1519      OT SHORT TERM GOAL #1   Title Pt will be mod I with HEP for visual vestibular, LUE coordination and strength - 11/07/2016   Status Achieved     OT SHORT TERM GOAL #2   Title Assess 9 hole peg and set goal as appropriate   Status Achieved     OT SHORT TERM GOAL #3  Title Pt will rate disequilibrium no higher than 5/10 with functional activities requiring head turns, bending over (i.e. home mgmt tasks)   Status On-going  10/31/2016 pt with recent new small bleed - pt with worsening balance and symptoms      OT SHORT TERM GOAL #4   Title Pt will report no episodes of diplopia when reading   Status Achieved     OT SHORT TERM GOAL #5   Title Pt will report greater ease in buttoning shirt with L hand   Status Achieved     OT SHORT TERM GOAL #6   Title Pt will demonstrate improved LUE coordinaton as evidenced by decreasing time on 9 hole peg by at least 5 seconds to aide in functional activity (baseline=42.10)   Status Achieved  10/31/2016 L = 31.58           OT Long Term Goals - 10/31/16 1520      OT LONG TERM GOAL #1   Title Pt will be mod I with upgraded HEP - 12/05/2016   Status On-going     OT LONG TERM GOAL #2   Title Pt will demostrate ability to lift 6 pound object x3 with LUE to overhead shelf without dropping.   Status On-going     OT  LONG TERM GOAL #3   Title Pt will rate disequilibrium sensation 3/10 or less with home mgmt tasks that require head turns, head nods, full body turns and bending over.    Status On-going     OT LONG TERM GOAL #4   Title Pt will demonstrate ability to manuever on uneven outdoor surfaces with transitonal head/eye movement as prep to return to yard work with disequilibrium rated no more than 3/10.   Status On-going     OT LONG TERM GOAL #5   Title Pt will demonstrate improved coordination LUE as evidenced by decreasing time on 9 hole peg to under 30 seconds to aide in functional activity (baseline= 42.10)   Status Revised               Plan - 10/31/16 1521    Clinical Impression Statement Pt reports that after termination of last session he went to ED where they discovered new small bleed.  Pt cleared to return to therapy. Pt to have surgery on 12/17/2016,  See PN for changes.     Rehab Potential Good   OT Frequency 2x / week   OT Duration 8 weeks   OT Treatment/Interventions Self-care/ADL training;Therapeutic exercise;Neuromuscular education;Therapist, nutritional;Therapeutic activities;Visual/perceptual remediation/compensation;Patient/family education;Balance training;DME and/or AE instruction   Plan motion sensitivity assessment if able, ? upgraded visual vestibular HEP if medically cleared to do so, address balance via functional tasks with head turns, body turns, transitional  movements, balance reactoins   Consulted and Agree with Plan of Care Patient;Family member/caregiver   Family Member Consulted son Dorothea Ogle      Patient will benefit from skilled therapeutic intervention in order to improve the following deficits and impairments:  Abnormal gait, Decreased activity tolerance, Decreased balance, Decreased coordination, Decreased mobility, Decreased knowledge of use of DME, Decreased strength, Difficulty walking, Impaired UE functional use, Impaired sensation, Impaired  vision/preception  Visit Diagnosis: Unsteadiness on feet  Other symptoms and signs involving the nervous system  Visuospatial deficit  Other lack of coordination  Hemiplegia 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 Cerebral  cavernous malformation 09/28/2016  . Seizures ([facility]) 09/28/2016  . Keratoma 09/28/2016  . ICH (intracerebral hemorrhage) ([facility]) 09/27/2016  . Hemangioma of skin and subcutaneous tissue 07/07/2016  . Visit for preventive health examination 07/07/2016  . Prostate cancer screening 07/07/2016  . Obstructive sleep apnea 12/23/2015  . Arteriovenous malformation of brain 12/06/2015  . Cavernous hemangioma of brain ([facility]) 12/30/2014  . Bipolar disorder ([place]) 12/30/2014  . HTN (hypertension) 12/30/2014    Quay Burow, OTR/L 10/31/2016, 3:26 PM  [facility] [address] [place], [place], [address] Phone: [phone]   Fax:  [phone]  Name: Todd Harris MRN: [id number] Date of Birth: [birth year]

## 2016-10-31 NOTE — Therapy (Signed)
[facility] [address] [place], [place], [address] Phone: [phone]   Fax:  [phone]  Physical Therapy Treatment  Patient Details  Name: HEMAN QUE MRN: [id number] Date of Birth: [birth year] Referring Provider: Dr. Sarina Ill  Encounter Date: 10/31/2016      PT End of Session - 10/31/16 2159    Visit Number 3   Number of Visits 17   Date for PT Re-Evaluation 12/09/16   Authorization Type [employer]   Authorization - Visit Number 3   Authorization - Number of Visits 20   PT Start Time 5573   PT Stop Time 1619   PT Time Calculation (min) 46 min      Past Medical History:  Diagnosis Date  . Anxiety   . Atrial arrhythmia   . Barrett esophagus   . Bipolar 1 disorder ([facility])   . Cavernous angioma   . Depression   . History of chicken pox   . Hypertension   . Seizures ([facility])    1 in 2005, 1 in 2016   . Sleep apnea   . Sleep apnea   . Stroke ([facility]) 11/2014   No residual deficits  . Venous malformation    Brain    Past Surgical History:  Procedure Laterality Date  . APPENDECTOMY  1970   age 52  . bone spur     age 42  . kidney stones    . TONSILLECTOMY  1968    There were no vitals filed for this visit.      Subjective Assessment - 10/31/16 2146    Subjective pt reports he is walking alot at home; accompanied to PT by his son; states he continues to have vertigo - rates it 4/10 at start of session   Patient is accompained by: Family member  Son, Dorothea Ogle   Pertinent History cerebral cavernous malformation, seizures, ICH, h/o stroke 11/2014, h/o multiple cavernomas, atrial arrhythmia   Patient Stated Goals "Be able to cope with dizziness and walk downstairs without feeling like I'm going to fall; be able to not have to take the Meclizine"   Currently in Pain? No/denies                         [facility] Adult PT Treatment/Exercise - 10/31/16 1602      Transfers   Transfers Sit to Stand    Sit to Stand 4: Min guard   Number of Reps Other reps (comment)  5 reps to each side   Comments added alternating step and pivot turn to each side - 5 reps each with sit to stand between turns     Ambulation/Gait   Ambulation/Gait Yes   Ambulation/Gait Assistance 5: Supervision   Ambulation/Gait Assistance Details no head turns   Ambulation Distance (Feet) 230 Feet   Assistive device None   Gait Pattern Step-through pattern   Ambulation Surface Level;Indoor   Gait velocity 8.00 = 4.1 ft/sec     Standardized Balance Assessment   Standardized Balance Assessment Timed Up and Go Test     Timed Up and Go Test   TUG Normal TUG   Normal TUG (seconds) 8.78     High Level Balance   High Level Balance Activities Side stepping;Backward walking;Turns;Tandem walking;Marching forwards;Marching backwards;Other (comment)  crossovers and stepping behind inside // bars     Pt performed standing on 1" foam in corner with UE support on chair prn - with CGA - trunk  rotations with touching each side of wall 5 reps each, then diagonals 5 reps each; squats on this foam - EO and then with EC  SLS activity - rolling ball up/back and laterally 5 reps each direction with each LE with UE support prn to improve SLS and coordination  Pt performed alternate tap downs to floor while standing on foam - 5 reps each foot with CGA        Balance Exercises - 10/31/16 2155      Balance Exercises: Standing   Standing Eyes Opened Narrow base of support (BOS);Wide (BOA);Head turns;Foam/compliant surface;Other reps (comment)  10 reps for horizontal and vertical head turns   Standing Eyes Closed Narrow base of support (BOS);Wide (BOA);Head turns;Foam/compliant surface;5 reps   SLS Eyes open;Upper extremity support 2;10 secs;Eyes closed  on RLE and LLE   Tandem Gait Forward;2 reps  inside // bars   Other Standing Exercises Pt performed standing balance on BOSU - inside // bars with UE support - anterior and  posterior weight shifts:             PT Short Term Goals - 10/11/16 1604      PT SHORT TERM GOAL #1   Title Improve TUG score from 14.53 to </= 10 secs without device with c/o dizziness </= 2/10 intensity.   Baseline 14.53 secs = vertigo 4/10 intensity   Time 4   Period Weeks   Status New   Target Date 11/10/16     PT SHORT TERM GOAL #2   Title Increase gait velocity from 2.56 ft/sec to >/= 3.0 ft/sec for incr. gait efficiency.   Baseline 12.58 secs = 2.56 ft/sec   Time 4   Period Weeks   Status New   Target Date 11/10/16     PT SHORT TERM GOAL #3   Title Perform DVA test and establish goal as appropriate.   Baseline unable to tolerate testing due to c/o severe vertigo with head movement   Time 4   Period Weeks   Status New   Target Date 11/10/16     PT SHORT TERM GOAL #4   Title Complete Berg test and set goal as appropriate.   Time 4   Period Weeks   Status New   Target Date 11/10/16     PT SHORT TERM GOAL #5   Title Negotiate steps with 1 hand rail using a step over step sequence with dizziness </= 2/10   Time 4   Period Weeks   Status New   Target Date 11/10/16     Additional Short Term Goals   Additional Short Term Goals Yes     PT SHORT TERM GOAL #6   Title Complete SOT and establish goal as appropriate   Time 4   Period Weeks   Status New   Target Date 11/10/16     PT SHORT TERM GOAL #7   Title Independent in HEP for balance and vestibular exercises.   Time 4   Period Weeks   Status New   Target Date 11/10/16           PT Long Term Goals - 10/11/16 1612      PT LONG TERM GOAL #1   Title Increase gait velocity to >/= 3.5 ft/sec for increased gait efficiency.   Baseline 2.56 ft/sec on 10-10-16   Time 8   Period Weeks   Status New   Target Date 12/09/16     PT LONG TERM GOAL #2  Title Increase Berg score to at least 50/56 to decrease fall risk.   Time 8   Period Weeks   Status New   Target Date 12/09/16     PT LONG TERM GOAL  #3   Title Increase DGI to at least 19/24 to reduce fall risk.   Time 8   Period Weeks   Status New   Target Date 12/09/16     PT LONG TERM GOAL #4   Title Improve DVA to </= 3 line difference.     Baseline unable to tolerate testing on 10-10-16 due to severity of vertigo   Time 8   Period Weeks   Status New   Target Date 12/09/16     PT LONG TERM GOAL #5   Title Report at least 75% improvement in vertigo.   Time 8   Period Weeks   Status New   Target Date 12/09/16     Additional Long Term Goals   Additional Long Term Goals Yes     PT LONG TERM GOAL #6   Title Independent in updated HEP as appropriate.   Time 8   Period Weeks   Status New   Target Date 12/09/16               Plan - 10/31/16 2159    Clinical Impression Statement Pt's gait improved today with less ataxia and more narrow BOS noted during gait;  pt had increased dizziness with standing balance activities with EC and continues to have difficulty with SLS on bil. legs, with more difficulty on LLE due to numbness.   Rehab Potential Good   Clinical Impairments Affecting Rehab Potential impaired sensation Lt side of body   PT Frequency 2x / week   PT Duration 8 weeks   PT Treatment/Interventions ADLs/Self Care Home Management;Neuromuscular re-education;Balance training;Therapeutic exercise;Therapeutic activities;Stair training;Gait training;Patient/family education;Vestibular;Visual/perceptual remediation/compensation   PT Next Visit Plan continue balance and gait training - use of compliant surfaces for incr. vestibular input   PT Home Exercise Plan sit to stand without UE support, amb. in home with head turns   Consulted and Agree with Plan of Care Patient;Family member/caregiver   Family Member Consulted son Dorothea Ogle      Patient will benefit from skilled therapeutic intervention in order to improve the following deficits and impairments:  Abnormal gait, Decreased activity tolerance, Decreased balance,  Decreased coordination, Decreased endurance, Decreased strength, Dizziness, Impaired vision/preception, Impaired sensation  Visit Diagnosis: Unsteadiness on feet  Other abnormalities of gait and mobility     Problem List Patient Active Problem List   Diagnosis Date Noted  . Cerebral cavernous malformation 09/28/2016  . Seizures ([facility]) 09/28/2016  . Keratoma 09/28/2016  . ICH (intracerebral hemorrhage) ([facility]) 09/27/2016  . Hemangioma of skin and subcutaneous tissue 07/07/2016  . Visit for preventive health examination 07/07/2016  . Prostate cancer screening 07/07/2016  . Obstructive sleep apnea 12/23/2015  . Arteriovenous malformation of brain 12/06/2015  . Cavernous hemangioma of brain ([facility]) 12/30/2014  . Bipolar disorder ([facility]) 12/30/2014  . HTN (hypertension) 12/30/2014    Calistro Rauf, [place], PT 10/31/2016, 10:07 PM  [facility] [address] [place], [place], [address] Phone: [phone]   Fax:  [phone]  Name: IZEK CORVINO MRN: [id number] Date of Birth: Dec 26, 1960

## 2016-11-01 ENCOUNTER — Ambulatory Visit: Payer: 59 | Admitting: Occupational Therapy

## 2016-11-01 ENCOUNTER — Encounter: Payer: Self-pay | Admitting: Occupational Therapy

## 2016-11-01 ENCOUNTER — Ambulatory Visit: Payer: 59 | Admitting: Rehabilitative and Restorative Service Providers"

## 2016-11-01 DIAGNOSIS — R29818 Other symptoms and signs involving the nervous system: Secondary | ICD-10-CM

## 2016-11-01 DIAGNOSIS — R2689 Other abnormalities of gait and mobility: Secondary | ICD-10-CM

## 2016-11-01 DIAGNOSIS — R2681 Unsteadiness on feet: Secondary | ICD-10-CM

## 2016-11-01 DIAGNOSIS — I69254 Hemiplegia and hemiparesis following other nontraumatic intracranial hemorrhage affecting left non-dominant side: Secondary | ICD-10-CM

## 2016-11-01 DIAGNOSIS — R42 Dizziness and giddiness: Secondary | ICD-10-CM | POA: Diagnosis not present

## 2016-11-01 DIAGNOSIS — R278 Other lack of coordination: Secondary | ICD-10-CM

## 2016-11-01 DIAGNOSIS — R41842 Visuospatial deficit: Secondary | ICD-10-CM

## 2016-11-01 NOTE — Therapy (Signed)
[facility] [address] [place], [place], [address] Phone: [phone]   Fax:  [phone]  Occupational Therapy Treatment  Patient Details  Name: Todd Harris MRN: [id number] Date of Birth: [birth year] Referring Provider: Dr Heide Spark  Encounter Date: 11/01/2016      OT End of Session - 11/01/16 1610    Visit Number 5   Number of Visits 16   Date for OT Re-Evaluation 12/05/16   Authorization Type [employer] 20 visits for OT and 20 visits for PT   OT Start Time 1147   OT Stop Time 1230   OT Time Calculation (min) 43 min   Activity Tolerance Patient tolerated treatment well      Past Medical History:  Diagnosis Date  . Anxiety   . Atrial arrhythmia   . Barrett esophagus   . Bipolar 1 disorder ([facility])   . Cavernous angioma   . Depression   . History of chicken pox   . Hypertension   . Seizures ([facility])    1 in 2005, 1 in 2016   . Sleep apnea   . Sleep apnea   . Stroke ([facility] [place] [facility]) 11/2014   No residual deficits  . Venous malformation    Brain    Past Surgical History:  Procedure Laterality Date  . APPENDECTOMY  1970   age 54  . bone spur     age 9  . kidney stones    . TONSILLECTOMY  1968    There were no vitals filed for this visit.      Subjective Assessment - 11/01/16 1157    Subjective  No pain just dizzy. Its worse if I sit around too much - it gets better if I move around more.    Pertinent History ICH, h/o of multiple cavernomosas   Patient Stated Goals I want to be able to do normal daily functions, type on computer if possible., deal with computer efficiently, possibly driving.    Currently in Pain? No/denies                      OT Treatments/Exercises (OP) - 11/01/16 0001      ADLs   ADL Comments Reviewed all goals - pt with several questions and answered all questions relative to OT progress.  Pt scheduled to have gamma knife surgery on 12/19/2016 and therefore is limited at  this point in challenging vestibular system functionally.  Recommended that pt be placed on hold temporarily for OT with plan to see intermittently prior to surgery and then following surgery. Pt in agreement with plan.      Fine Motor Coordination   Other Fine Motor Exercises Therapuetic activities to address in hand manipulation tasks, speed of coordination tasks, and manipulation of small items. Pt with minimal difficulty;  pt reports that sensation still has not returned as normal however has improved since eval                  OT Short Term Goals - 11/01/16 1604      OT SHORT TERM GOAL #1   Title Pt will be mod I with HEP for visual vestibular, LUE coordination and strength - 11/07/2016   Status Achieved     OT SHORT TERM GOAL #2   Title Assess 9 hole peg and set goal as appropriate   Status Achieved     OT SHORT TERM GOAL #3   Title Pt will rate disequilibrium no higher  than 5/10 with functional activities requiring head turns, bending over (i.e. home mgmt tasks)   Status On-going  10/31/2016 pt with recent new small bleed - pt with worsening balance and symptoms      OT SHORT TERM GOAL #4   Title Pt will report no episodes of diplopia when reading   Status Achieved     OT SHORT TERM GOAL #5   Title Pt will report greater ease in buttoning shirt with L hand   Status Achieved     OT SHORT TERM GOAL #6   Title Pt will demonstrate improved LUE coordinaton as evidenced by decreasing time on 9 hole peg by at least 5 seconds to aide in functional activity (baseline=42.10)   Status Achieved  10/31/2016 L = 31.58           OT Long Term Goals - 11/01/16 1605      OT LONG TERM GOAL #1   Title Pt will be mod I with upgraded HEP - 12/05/2016   Status On-going     OT LONG TERM GOAL #2   Title Pt will demostrate ability to lift 6 pound object x3 with LUE to overhead shelf without dropping.   Status On-going     OT LONG TERM GOAL #3   Title Pt will rate  disequilibrium sensation 3/10 or less with home mgmt tasks that require head turns, head nods, full body turns and bending over.    Status On-going     OT LONG TERM GOAL #4   Title Pt will demonstrate ability to manuever on uneven outdoor surfaces with transitonal head/eye movement as prep to return to yard work with disequilibrium rated no more than 3/10.   Status On-going     OT LONG TERM GOAL #5   Title Pt will demonstrate improved coordination LUE as evidenced by decreasing time on 9 hole peg to under 30 seconds to aide in functional activity (baseline= 42.10)   Status On-going               Plan - 11/01/16 1605    Clinical Impression Statement Pt has met several goals however at this time is awaiting brain surgery and is therefore limited in vestibular rehab activities; pt in agreement to place on hold at this time.    Rehab Potential Good   OT Frequency 2x / week   OT Duration 8 weeks   OT Treatment/Interventions Self-care/ADL training;Therapeutic exercise;Neuromuscular education;Therapist, nutritional;Therapeutic activities;Visual/perceptual remediation/compensation;Patient/family education;Balance training;DME and/or AE instruction   Plan on hold, reassess goals upon return in early December   Consulted and Agree with Plan of Care Patient      Patient will benefit from skilled therapeutic intervention in order to improve the following deficits and impairments:  Abnormal gait, Decreased activity tolerance, Decreased balance, Decreased coordination, Decreased mobility, Decreased knowledge of use of DME, Decreased strength, Difficulty walking, Impaired UE functional use, Impaired sensation, Impaired vision/preception  Visit Diagnosis: Unsteadiness on feet  Other symptoms and signs involving the nervous system  Visuospatial deficit  Other lack of coordination  Hemiplegia and hemiparesis following other nontraumatic intracranial hemorrhage affecting left  non-dominant side [facility])    Problem List Patient Active Problem List   Diagnosis Date Noted  . Cerebral cavernous malformation 09/28/2016  . Seizures ([facility]) 09/28/2016  . Keratoma 09/28/2016  . ICH (intracerebral hemorrhage) ([facility]) 09/27/2016  . Hemangioma of skin and subcutaneous tissue 07/07/2016  . Visit for preventive health examination 07/07/2016  . Prostate cancer screening 07/07/2016  .  Obstructive sleep apnea 12/23/2015  . Arteriovenous malformation of brain 12/06/2015  . Cavernous hemangioma of brain ([facility]) 12/30/2014  . Bipolar disorder ([facility]) 12/30/2014  . HTN (hypertension) 12/30/2014    Quay Burow, OTR/L 11/01/2016, 4:12 PM  [facility] [address] [place], [place], [address] Phone: [phone]   Fax:  [phone]  Name: LEOTIS ISHAM MRN: [id number] Date of Birth: [birth year]

## 2016-11-01 NOTE — Therapy (Signed)
[facility] [address] [place], [place], [address] Phone: [phone]   Fax:  [phone]  Physical Therapy Treatment  Patient Details  Name: Todd Harris MRN: [id number] Date of Birth: 1960/08/02 Referring Provider: Dr. Sarina Ill  Encounter Date: 11/01/2016      PT End of Session - 11/01/16 1444    Visit Number 4   Number of Visits 17   Date for PT Re-Evaluation 12/09/16   Authorization Type UHC   Authorization - Visit Number 4   Authorization - Number of Visits 20   PT Start Time 8341   PT Stop Time 1320   PT Time Calculation (min) 45 min   Equipment Utilized During Treatment Gait belt   Activity Tolerance Patient tolerated treatment well   Behavior During Therapy WFL for tasks assessed/performed      Past Medical History:  Diagnosis Date  . Anxiety   . Atrial arrhythmia   . Barrett esophagus   . Bipolar 1 disorder ([place])   . Cavernous angioma   . Depression   . History of chicken pox   . Hypertension   . Seizures ([place])    1 in 2005, 1 in 2016   . Sleep apnea   . Sleep apnea   . Stroke ([place]) 11/2014   No residual deficits  . Venous malformation    Brain    Past Surgical History:  Procedure Laterality Date  . APPENDECTOMY  1970   age 72  . bone spur     age 58  . kidney stones    . TONSILLECTOMY  1968    There were no vitals filed for this visit.      Subjective Assessment - 11/01/16 1231    Subjective "Yesterday was a higher energy day, and today I'm going to need a nap."  He notes that walking helps resolve symptoms.  "If I get up and move I feel better."  Dizziness 3/10 with ringing in the left ear is constant.   Pertinent History cerebral cavernous malformation, seizures, ICH, h/o stroke 11/2014, h/o multiple cavernomas, atrial arrhythmia   Patient Stated Goals "Be able to cope with dizziness and walk downstairs without feeling like I'm going to fall; be able to not have to take  the Meclizine"   Currently in Pain? No/denies            [facility] PT Assessment - 11/01/16 1258      Standardized Balance Assessment   Standardized Balance Assessment Balance Master Testing   Balance Master Testing Sensory Organization Test;Other/comments     Balance Master Testing    Results On sensory organization test, patient scores 48% compared to age/height normative values of 72%.  He has below normal performance on trials 1,2,3 of condition 3 (mildly diminished), and "falls" on condition 5 (2/3 trials) and condition 6 (2/3 trials).  His sensory analysis reveals WNLs use of somatosensory and visual feedback and reduced use of vestibular inputs for balance (14% compared to norm of 55%).  He demonstrates increased left weight shifting for COG alignment.                     [facility] Adult PT Treatment/Exercise - 11/01/16 1258      Ambulation/Gait   Ambulation/Gait Yes   Ambulation/Gait Assistance 6: Modified independent (Device/Increase time)   Ambulation/Gait Assistance Details uses wider base of support during gait with head motion and dynamic activities   Ambulation Distance (Feet) 400 Feet  Assistive device None   Ambulation Surface Level;Indoor     Neuro Re-ed    Neuro Re-ed Details  Ramp standing facing up/down with marching, alternating foot taps with CGA.  Standing on ramp in roll plane with  marching progressing to eyes closed, however needs mod A to maintain balance.  Marching on level surfaces with eyes closed with min A for safety.  Walking tandem stance with min to mod A due to imbalance.               Balance Exercises - 10/31/16 2155      Balance Exercises: Standing   Standing Eyes Opened Narrow base of support (BOS);Wide (BOA);Head turns;Foam/compliant surface;Other reps (comment)  10 reps for horizontal and vertical head turns   Standing Eyes Closed Narrow base of support (BOS);Wide (BOA);Head turns;Foam/compliant surface;5 reps   SLS Eyes  open;Upper extremity support 2;10 secs;Eyes closed  on RLE and LLE   Tandem Gait Forward;2 reps  inside // bars   Other Standing Exercises Pt performed standing balance on BOSU - inside // bars with UE support - anterior and posterior weight shifts:           PT Education - 11/01/16 1444    Education provided Yes   Education Details added head motion in diagonal and roll plane to HEP for balance/dizziness, and marching in place with head motion   Person(s) Educated Patient   Methods Explanation;Demonstration;Handout   Comprehension Verbalized understanding;Returned demonstration          PT Short Term Goals - 11/01/16 1234      PT SHORT TERM GOAL #1   Title Improve TUG score from 14.53 to </= 10 secs without device with c/o dizziness </= 2/10 intensity.   Baseline 8.78 seconds fof TUG at last tx session.   Time 4   Period Weeks   Status Achieved     PT SHORT TERM GOAL #2   Title Increase gait velocity from 2.56 ft/sec to >/= 3.0 ft/sec for incr. gait efficiency.   Baseline 4.1 ft/sec at last session.   Time 4   Period Weeks   Status Achieved     PT SHORT TERM GOAL #3   Title Perform DVA test and establish goal as appropriate.   Baseline *plan to hold until after gamma knife procedure   Time 4   Period Weeks   Status Deferred     PT SHORT TERM GOAL #4   Title Complete Berg test and set goal as appropriate.   Baseline 36/56 at baseline.   Time 4   Period Weeks   Status Achieved     PT SHORT TERM GOAL #5   Title Negotiate steps with 1 hand rail using a step over step sequence with dizziness </= 2/10   Baseline *baseline dizziness is 3/10, however steps do not increase dizziness.    Time 4   Period Weeks   Status Not Met     PT SHORT TERM GOAL #6   Title Complete SOT and establish goal as appropriate   Baseline Scores composite equilibrium score of 48% compared to 72% age/heigh normative value.    Time 4   Period Weeks   Status Achieved     PT SHORT TERM  GOAL #7   Title Independent in HEP for balance and vestibular exercises.   Baseline Met on 11/01/2016   Time 4   Period Weeks   Status Achieved           PT  Long Term Goals - 11/01/16 1234      PT LONG TERM GOAL #1   Title Increase gait velocity to >/= 3.5 ft/sec for increased gait efficiency.   Baseline see short term goal 11/01/16   Time 8   Period Weeks   Status Achieved     PT LONG TERM GOAL #2   Title Increase Berg score to at least 50/56 to decrease fall risk.   Time 8   Period Weeks   Status On-going     PT LONG TERM GOAL #3   Title Increase DGI to at least 19/24 to reduce fall risk.   Time 8   Period Weeks   Status On-going     PT LONG TERM GOAL #4   Title Improve DVA to </= 3 line difference.     Baseline see short term goal   Time 8   Period Weeks   Status Deferred     PT LONG TERM GOAL #5   Title Report at least 75% improvement in vertigo.   Time 8   Period Weeks   Status On-going     PT LONG TERM GOAL #6   Title Independent in updated HEP as appropriate.   Time 8   Period Weeks   Status On-going               Plan - 11/01/16 1456    Clinical Impression Statement The patient met 5/7 STGs.  PT progressing dynamic balance activities emphasizing roll plane (lateral tilt) through head motions and through standing on inclines sideways.  Also note patient maintains weight anteriorly during balance tasks--plan to work on limits of stability.    PT Treatment/Interventions ADLs/Self Care Home Management;Neuromuscular re-education;Balance training;Therapeutic exercise;Therapeutic activities;Stair training;Gait training;Patient/family education;Vestibular;Visual/perceptual remediation/compensation   PT Next Visit Plan Balance training--work on posterior limit of stability (hangs out anteriorly when he loses his balance), work on roll plane head motion and balance.  Dynamic gait activities, turns and narrowing base of support.   Consulted and Agree with  Plan of Care Family member/caregiver;Patient      Patient will benefit from skilled therapeutic intervention in order to improve the following deficits and impairments:  Abnormal gait, Decreased activity tolerance, Decreased balance, Decreased coordination, Decreased endurance, Decreased strength, Dizziness, Impaired vision/preception, Impaired sensation  Visit Diagnosis: Unsteadiness on feet  Other abnormalities of gait and mobility     Problem List Patient Active Problem List   Diagnosis Date Noted  . Cerebral cavernous malformation 09/28/2016  . Seizures ([place]) 09/28/2016  . Keratoma 09/28/2016  . ICH (intracerebral hemorrhage) ([place]) 09/27/2016  . Hemangioma of skin and subcutaneous tissue 07/07/2016  . Visit for preventive health examination 07/07/2016  . Prostate cancer screening 07/07/2016  . Obstructive sleep apnea 12/23/2015  . Arteriovenous malformation of brain 12/06/2015  . Cavernous hemangioma of brain ([place]) 12/30/2014  . Bipolar disorder ([place]) 12/30/2014  . HTN (hypertension) 12/30/2014    Chung Chagoya, PT 11/01/2016, 3:15 PM  [facility] [address] [place], [place], [address] Phone: [phone]   Fax:  [phone]  Name: Todd Harris MRN: [id number] Date of Birth: Aug 24, 1960

## 2016-11-01 NOTE — Patient Instructions (Signed)
Feet Together, Varied Arm Positions - Eyes Closed    Stand with feet together and arms at your side.  Close eyes and visualize upright position. Hold __20-30__ seconds. Repeat _3___ times per session. Do __2__ sessions per day.  IF ALONE:  DO WITH FEET 2-3 INCHES APART AND WORK TOWARDS ALL THE WAY TOGETHER IF YOU HAVE HELP/FAMILY TO STAND BY: DO WITH FEET TOGETHER. Copyright  VHI. All rights reserved.   Feet Together, Head Motion - Eyes Open    With eyes open, feet together, move head slowly: up and down (about 20 degrees up, then down at slow pace), then side to side at slowed pace.  Then perform diagonal head turns and tilting x 5 reps.  Repeat __5__ times per session. Do __2__ sessions per day.  Copyright  VHI. All rights reserved.   Functional Quadriceps: Sit to Stand    Sit on edge of chair, feet flat on floor. Stand upright, extending knees fully. Repeat ___10_ times per set. Do __1__ sets per session. Do __1__ sessions per day.  TRY NOT TO USE HANDS  http://orth.exer.us/734  Copyright  VHI. All rights reserved.  FUNCTIONAL MOBILITY: Marching - Standing    March in place by lifting left leg up, then right. Alternate. Add head motion *stand near a wall or countertop for support. __10_ reps per set, _1-2__ sets per day.   Copyright  VHI. All rights reserved.

## 2016-11-07 ENCOUNTER — Ambulatory Visit: Payer: 59 | Admitting: Physical Therapy

## 2016-11-09 ENCOUNTER — Encounter: Payer: Self-pay | Admitting: Physical Therapy

## 2016-11-09 ENCOUNTER — Ambulatory Visit: Payer: 59 | Attending: Physician Assistant | Admitting: Physical Therapy

## 2016-11-09 ENCOUNTER — Encounter: Payer: Self-pay | Admitting: Neurology

## 2016-11-09 DIAGNOSIS — R2689 Other abnormalities of gait and mobility: Secondary | ICD-10-CM | POA: Diagnosis not present

## 2016-11-09 DIAGNOSIS — R42 Dizziness and giddiness: Secondary | ICD-10-CM | POA: Diagnosis not present

## 2016-11-09 DIAGNOSIS — R2681 Unsteadiness on feet: Secondary | ICD-10-CM | POA: Diagnosis not present

## 2016-11-09 NOTE — Therapy (Signed)
[facility] [address] [place], [place], [address] Phone: [phone]   Fax:  [phone]  Physical Therapy Treatment  Patient Details  Name: Todd Harris MRN: [id number] Date of Birth: Jan 19, 1960 Referring Provider: Dr. Sarina Ill   Encounter Date: 11/09/2016  PT End of Session - 11/09/16 1205    Visit Number  5    Number of Visits  17    Date for PT Re-Evaluation  12/09/16    Authorization Type  UHC    Authorization - Visit Number  5    Authorization - Number of Visits  20    PT Start Time  1020    PT Stop Time  1109    PT Time Calculation (min)  49 min       Past Medical History:  Diagnosis Date  . Anxiety   . Atrial arrhythmia   . Barrett esophagus   . Bipolar 1 disorder ([place])   . Cavernous angioma   . Depression   . History of chicken pox   . Hypertension   . Seizures ([place])    1 in 2005, 1 in 2016   . Sleep apnea   . Sleep apnea   . Stroke ([place]) 11/2014   No residual deficits  . Venous malformation    Brain    Past Surgical History:  Procedure Laterality Date  . APPENDECTOMY  1970   age 38  . bone spur     age 61  . kidney stones    . TONSILLECTOMY  1968    There were no vitals filed for this visit.  Subjective Assessment - 11/09/16 1157    Subjective  Pt reports he used leaf blower yesterday - did alot but "I hope I didn't overdo it";  pt reports dizziness 3/10 intensity constant - also reports constant tinnitus in left ear    Pertinent History  cerebral cavernous malformation, seizures, ICH, h/o stroke 11/2014, h/o multiple cavernomas, atrial arrhythmia    Patient Stated Goals  "Be able to cope with dizziness and walk downstairs without feeling like I'm going to fall; be able to not have to take the Meclizine"    Currently in Pain?  No/denies                           Balance Exercises - 11/09/16 1159      Balance Exercises: Standing   Standing Eyes Opened   Narrow base of support (BOS);Wide (BOA);Head turns;Foam/compliant surface;Other reps (comment) 10 reps ; also lateral tilt head movement   10 reps ; also lateral tilt head movement   Standing Eyes Closed  Narrow base of support (BOS);Wide (BOA);Head turns;Foam/compliant surface;Other reps (comment) 10 reps - lateral tilt head turns added with UE support prn   10 reps - lateral tilt head turns added with UE support prn   Other Standing Exercises  Partial tandem stance on floor - 30 sec hold each position;  ankle sways 10 reps with wall behind him and chair in front 10 reps                                                       PT Education - 11/09/16 1204    Education provided  Yes    Education Details  added compliant surface standing with EO and EC; partial tandem stance and ankle sways    Person(s) Educated  Patient;Spouse    Methods  Explanation;Demonstration;Handout    Comprehension  Verbalized understanding;Returned demonstration       PT Short Term Goals - 11/01/16 1234      PT SHORT TERM GOAL #1   Title  Improve TUG score from 14.53 to </= 10 secs without device with c/o dizziness </= 2/10 intensity.    Baseline  8.78 seconds fof TUG at last tx session.    Time  4    Period  Weeks    Status  Achieved      PT SHORT TERM GOAL #2   Title  Increase gait velocity from 2.56 ft/sec to >/= 3.0 ft/sec for incr. gait efficiency.    Baseline  4.1 ft/sec at last session.    Time  4    Period  Weeks    Status  Achieved      PT SHORT TERM GOAL #3   Title  Perform DVA test and establish goal as appropriate.    Baseline  *plan to hold until after gamma knife procedure    Time  4    Period  Weeks    Status  Deferred      PT SHORT TERM GOAL #4   Title  Complete Berg test and set goal as appropriate.    Baseline  36/56 at baseline.    Time  4    Period  Weeks    Status  Achieved      PT SHORT TERM GOAL #5   Title  Negotiate steps with 1 hand rail using a step over step sequence  with dizziness </= 2/10    Baseline  *baseline dizziness is 3/10, however steps do not increase dizziness.     Time  4    Period  Weeks    Status  Not Met      PT SHORT TERM GOAL #6   Title  Complete SOT and establish goal as appropriate    Baseline  Scores composite equilibrium score of 48% compared to 72% age/heigh normative value.     Time  4    Period  Weeks    Status  Achieved      PT SHORT TERM GOAL #7   Title  Independent in HEP for balance and vestibular exercises.    Baseline  Met on 11/01/2016    Time  4    Period  Weeks    Status  Achieved        PT Long Term Goals - 11/01/16 1234      PT LONG TERM GOAL #1   Title  Increase gait velocity to >/= 3.5 ft/sec for increased gait efficiency.    Baseline  see short term goal 11/01/16    Time  8    Period  Weeks    Status  Achieved      PT LONG TERM GOAL #2   Title  Increase Berg score to at least 50/56 to decrease fall risk.    Time  8    Period  Weeks    Status  On-going      PT LONG TERM GOAL #3   Title  Increase DGI to at least 19/24 to reduce fall risk.    Time  8    Period  Weeks    Status  On-going      PT LONG TERM GOAL #4  Title  Improve DVA to </= 3 line difference.      Baseline  see short term goal    Time  8    Period  Weeks    Status  Deferred      PT LONG TERM GOAL #5   Title  Report at least 75% improvement in vertigo.    Time  8    Period  Weeks    Status  On-going      PT LONG TERM GOAL #6   Title  Independent in updated HEP as appropriate.    Time  8    Period  Weeks    Status  On-going            Plan - 11/09/16 1205    Clinical Impression Statement  Pt has difficulty with posterior weight shift (limits of stability) with intermittent LOB occurring with this exercise:  improved with tactile cues to minimize hip strategy and with practice/repetition;  pt did well with maintaining balance on compliant surface    Rehab Potential  Good    Clinical Impairments Affecting Rehab  Potential  impaired sensation Lt side of body    PT Frequency  -- decr. frequency to 1x/week til 12-19-16 (sx date)   decr. frequency to 1x/week til 12-19-16 (sx date)   PT Duration  8 weeks    PT Treatment/Interventions  ADLs/Self Care Home Management;Neuromuscular re-education;Balance training;Therapeutic exercise;Therapeutic activities;Stair training;Gait training;Patient/family education;Vestibular;Visual/perceptual remediation/compensation    PT Next Visit Plan  dynamic gait, compliant surface training - check HEP    PT Home Exercise Plan  sit to stand without UE support, amb. in home with head turns    Consulted and Agree with Plan of Care  Family member/caregiver;Patient       Patient will benefit from skilled therapeutic intervention in order to improve the following deficits and impairments:  Abnormal gait, Decreased activity tolerance, Decreased balance, Decreased coordination, Decreased endurance, Decreased strength, Dizziness, Impaired vision/preception, Impaired sensation  Visit Diagnosis: Unsteadiness on feet  Other abnormalities of gait and mobility     Problem List Patient Active Problem List   Diagnosis Date Noted  . Cerebral cavernous malformation 09/28/2016  . Seizures ([place]) 09/28/2016  . Keratoma 09/28/2016  . ICH (intracerebral hemorrhage) ([place]) 09/27/2016  . Hemangioma of skin and subcutaneous tissue 07/07/2016  . Visit for preventive health examination 07/07/2016  . Prostate cancer screening 07/07/2016  . Obstructive sleep apnea 12/23/2015  . Arteriovenous malformation of brain 12/06/2015  . Cavernous hemangioma of brain ([place]) 12/30/2014  . Bipolar disorder ([place]) 12/30/2014  . HTN (hypertension) 12/30/2014    Glendoris Nodarse, [place], PT 11/09/2016, 12:13 PM  [facility] [address] [place], [place], [address] Phone: [phone]   Fax:  [phone]  Name: Todd Harris MRN: [id number] Date  of Birth: 05-06-1960

## 2016-11-09 NOTE — Patient Instructions (Addendum)
Tandem Stance    Right foot in front of left, heel touching toe both feet "straight ahead". Stand on Foot Triangle of Support with both feet. Balance in this position _30__ seconds. Do with left foot in front of right.   PARTIAL HEEL TO TOE - EACH POSITION   Copyright  VHI. All rights reserved.  Anterior / Posterior Sway  ANKLE SWAY    Stand in neutral posture. Keeping head in neutral position, rock back and forth, toes up then heels up. Rock __10-15__ times. Do _1-2___ sets.  DO 2 TIMES/DAY   http://bt.exer.us/266   Copyright  VHI. All rights reserved.  Feet Apart (Compliant Surface) Arm Motion - Eyes Closed    Stand on compliant surface: ___pillow_____, feet shoulder width apart. Close eyes and move arms up and down: to front. Repeat __2__ times per session. Do __2__ sessions per day. NO arm motion - head turns side to side, up/down, lateral   Copyright  VHI. All rights reserved.  Feet Apart (Compliant Surface) Arm Motion - Eyes Open    With eyes open, standing on compliant surface: __pillow______, feet shoulder width apart, move arms up and down: to front. Repeat __1-2__ times per session. Do __2__ sessions per day.  Copyright  VHI. All rights reserved.

## 2016-11-10 ENCOUNTER — Encounter: Payer: 59 | Admitting: Occupational Therapy

## 2016-11-10 ENCOUNTER — Ambulatory Visit: Payer: 59 | Admitting: Physical Therapy

## 2016-11-14 ENCOUNTER — Ambulatory Visit: Payer: 59 | Admitting: Physical Therapy

## 2016-11-14 ENCOUNTER — Encounter: Payer: 59 | Admitting: Occupational Therapy

## 2016-11-16 ENCOUNTER — Ambulatory Visit: Payer: 59 | Admitting: Physical Therapy

## 2016-11-16 ENCOUNTER — Encounter: Payer: 59 | Admitting: Occupational Therapy

## 2016-11-16 DIAGNOSIS — R2681 Unsteadiness on feet: Secondary | ICD-10-CM

## 2016-11-16 DIAGNOSIS — R42 Dizziness and giddiness: Secondary | ICD-10-CM

## 2016-11-17 ENCOUNTER — Encounter: Payer: Self-pay | Admitting: Physical Therapy

## 2016-11-17 NOTE — Therapy (Signed)
[facility] [address] [place], [place], [address] Phone: [phone]   Fax:  [phone]  Physical Therapy Treatment  Patient Details  Name: Todd Harris MRN: [id number] Date of Birth: Jun 04, 1960 Referring Provider: Dr. Sarina Ill   Encounter Date: 11/16/2016  PT End of Session - 11/17/16 0735    Visit Number  6    Number of Visits  17    Date for PT Re-Evaluation  12/09/16    Authorization Type  UHC    Authorization - Visit Number  6    Authorization - Number of Visits  20    PT Start Time  6283    PT Stop Time  1616    PT Time Calculation (min)  43 min       Past Medical History:  Diagnosis Date  . Anxiety   . Atrial arrhythmia   . Barrett esophagus   . Bipolar 1 disorder ([place])   . Cavernous angioma   . Depression   . History of chicken pox   . Hypertension   . Seizures ([place])    1 in 2005, 1 in 2016   . Sleep apnea   . Sleep apnea   . Stroke ([place]) 11/2014   No residual deficits  . Venous malformation    Brain    Past Surgical History:  Procedure Laterality Date  . APPENDECTOMY  1970   age 10  . bone spur     age 40  . kidney stones    . TONSILLECTOMY  1968    There were no vitals filed for this visit.  Subjective Assessment - 11/17/16 0732    Subjective  Pt reports dizziness "slightly better" - performance is best in the mornings before he gets tired - states he is now able to easily and freely turn his head side to side in sitting without tightness and without dizziness    Patient is accompained by:  Family member    Pertinent History  cerebral cavernous malformation, seizures, ICH, h/o stroke 11/2014, h/o multiple cavernomas, atrial arrhythmia    Patient Stated Goals  "Be able to cope with dizziness and walk downstairs without feeling like I'm going to fall; be able to not have to take the Meclizine"    Currently in Pain?  No/denies         [facility] PT Assessment - 11/16/16 0001       Berg Balance Test   Sit to Stand  Able to stand without using hands and stabilize independently    Standing Unsupported  Able to stand safely 2 minutes    Sitting with Back Unsupported but Feet Supported on Floor or Stool  Able to sit safely and securely 2 minutes    Stand to Sit  Sits safely with minimal use of hands    Transfers  Able to transfer safely, minor use of hands    Standing Unsupported with Eyes Closed  Able to stand 10 seconds safely    Standing Ubsupported with Feet Together  Able to place feet together independently and stand 1 minute safely    From Standing, Reach Forward with Outstretched Arm  Can reach confidently >25 cm (10")    From Standing Position, Pick up Object from Floor  Able to pick up shoe safely and easily    From Standing Position, Turn to Look Behind Over each Shoulder  Looks behind from both sides and weight shifts well    Turn 360 Degrees  Able to turn 360 degrees safely in 4 seconds or less R= 2.91, L= 2.38    Standing Unsupported, Alternately Place Feet on Step/Stool  Able to stand independently and safely and complete 8 steps in 20 seconds    Standing Unsupported, One Foot in Front  Able to plae foot ahead of the other independently and hold 30 seconds    Standing on One Leg  Able to lift leg independently and hold 5-10 seconds LLE = 8.6   RLE= 11.53    Total Score  54       NeuroRe-ed:  Reviewed balance on foam exercises - standing on pillows with feet apart and feet together - with EO and EC Added targets with EO and horizontal and vertical head turns with EC - 10 reps each  Pt performed marching on blue mat on incline/decline - head turns with EO and EC with CGA to min assist for recovery of LOB Alternate stepping up/back and down/back with EO and EC; head turns with EO only  Pt amb. Up/down on mat on ramp with EC with CGA 2 reps   Pt performed gentle bouncing on blue physioball - head turns with EO and then with EC for incr. Vestibular  input        [facility] Adult PT Treatment/Exercise - 11/16/16 0001      Standardized Balance Assessment   Standardized Balance Assessment  Berg Balance Test               PT Short Term Goals - 11/01/16 1234      PT SHORT TERM GOAL #1   Title  Improve TUG score from 14.53 to </= 10 secs without device with c/o dizziness </= 2/10 intensity.    Baseline  8.78 seconds fof TUG at last tx session.    Time  4    Period  Weeks    Status  Achieved      PT SHORT TERM GOAL #2   Title  Increase gait velocity from 2.56 ft/sec to >/= 3.0 ft/sec for incr. gait efficiency.    Baseline  4.1 ft/sec at last session.    Time  4    Period  Weeks    Status  Achieved      PT SHORT TERM GOAL #3   Title  Perform DVA test and establish goal as appropriate.    Baseline  *plan to hold until after gamma knife procedure    Time  4    Period  Weeks    Status  Deferred      PT SHORT TERM GOAL #4   Title  Complete Berg test and set goal as appropriate.    Baseline  36/56 at baseline.    Time  4    Period  Weeks    Status  Achieved      PT SHORT TERM GOAL #5   Title  Negotiate steps with 1 hand rail using a step over step sequence with dizziness </= 2/10    Baseline  *baseline dizziness is 3/10, however steps do not increase dizziness.     Time  4    Period  Weeks    Status  Not Met      PT SHORT TERM GOAL #6   Title  Complete SOT and establish goal as appropriate    Baseline  Scores composite equilibrium score of 48% compared to 72% age/heigh normative value.     Time  4    Period  Weeks  Status  Achieved      PT SHORT TERM GOAL #7   Title  Independent in HEP for balance and vestibular exercises.    Baseline  Met on 11/01/2016    Time  4    Period  Weeks    Status  Achieved        PT Long Term Goals - 11/16/16 1553      PT LONG TERM GOAL #2   Title  Increase Berg score to at least 50/56 to decrease fall risk.    Baseline  54/56 on 11-16-16    Status  Achieved             Plan - 11/17/16 0736    Clinical Impression Statement  Pt's Berg balance test score has increased from 36/56 to 54/56, demonstrating significant improvement in standing balance.  Pt also demonstrating much reduction in dizziness with pt now able to perform head turns with eyes open with minimal c/o dizziness.  Pt cont. to c/o dizziness with standing on compliant surfaces with EC.      Rehab Potential  Good    Clinical Impairments Affecting Rehab Potential  impaired sensation Lt side of body    PT Duration  8 weeks    PT Treatment/Interventions  ADLs/Self Care Home Management;Neuromuscular re-education;Balance training;Therapeutic exercise;Therapeutic activities;Stair training;Gait training;Patient/family education;Vestibular;Visual/perceptual remediation/compensation    PT Next Visit Plan  dynamic gait, compliant surface training     PT Home Exercise Plan  sit to stand without UE support, amb. in home with head turns; balance on foam EO and EC added on 11-09-16    Consulted and Agree with Plan of Care  Family member/caregiver;Patient       Patient will benefit from skilled therapeutic intervention in order to improve the following deficits and impairments:  Abnormal gait, Decreased activity tolerance, Decreased balance, Decreased coordination, Decreased endurance, Decreased strength, Dizziness, Impaired vision/preception, Impaired sensation  Visit Diagnosis: Dizziness and giddiness  Unsteadiness on feet     Problem List Patient Active Problem List   Diagnosis Date Noted  . Cerebral cavernous malformation 09/28/2016  . Seizures ([facility]) 09/28/2016  . Keratoma 09/28/2016  . ICH (intracerebral hemorrhage) ([facility]) 09/27/2016  . Hemangioma of skin and subcutaneous tissue 07/07/2016  . Visit for preventive health examination 07/07/2016  . Prostate cancer screening 07/07/2016  . Obstructive sleep apnea 12/23/2015  . Arteriovenous malformation of brain 12/06/2015  . Cavernous  hemangioma of brain ([place]) 12/30/2014  . Bipolar disorder ([place]) 12/30/2014  . HTN (hypertension) 12/30/2014    Mackensie Pilson, [place], PT 11/17/2016, 7:42 AM  [facility] [address] [place], [place], [address] Phone: [phone]   Fax:  [phone]  Name: Todd Harris MRN: [id number] Date of Birth: 1960/03/12

## 2016-11-20 ENCOUNTER — Ambulatory Visit: Payer: Self-pay | Admitting: Neurology

## 2016-11-21 ENCOUNTER — Encounter: Payer: 59 | Admitting: Occupational Therapy

## 2016-11-21 ENCOUNTER — Ambulatory Visit: Payer: 59 | Admitting: Physical Therapy

## 2016-11-21 DIAGNOSIS — R2681 Unsteadiness on feet: Secondary | ICD-10-CM | POA: Diagnosis not present

## 2016-11-21 DIAGNOSIS — R42 Dizziness and giddiness: Secondary | ICD-10-CM

## 2016-11-22 ENCOUNTER — Ambulatory Visit: Payer: 59 | Admitting: Physical Therapy

## 2016-11-22 ENCOUNTER — Encounter: Payer: Self-pay | Admitting: Physical Therapy

## 2016-11-22 NOTE — Therapy (Signed)
[facility] [address] [place], [place], [address] Phone: [phone]   Fax:  [phone]  Physical Therapy Treatment  Patient Details  Name: Todd Harris MRN: [id number] Date of Birth: 1960-05-19 Referring Provider: Dr. Sarina Ill   Encounter Date: 11/21/2016  PT End of Session - 11/22/16 2057    Visit Number  7    Number of Visits  17    Date for PT Re-Evaluation  12/09/16    Authorization Type  UHC    Authorization - Visit Number  7    Authorization - Number of Visits  20    PT Start Time  2563    PT Stop Time  1624    PT Time Calculation (min)  49 min       Past Medical History:  Diagnosis Date  . Anxiety   . Atrial arrhythmia   . Barrett esophagus   . Bipolar 1 disorder ([place])   . Cavernous angioma   . Depression   . History of chicken pox   . Hypertension   . Seizures ([place])    1 in 2005, 1 in 2016   . Sleep apnea   . Sleep apnea   . Stroke ([place]) 11/2014   No residual deficits  . Venous malformation    Brain    Past Surgical History:  Procedure Laterality Date  . APPENDECTOMY  1970   age 10  . bone spur     age 75  . kidney stones    . TONSILLECTOMY  1968    There were no vitals filed for this visit.  Subjective Assessment - 11/22/16 2054    Subjective  Pt states he is doing well - has been released to drive - states he has driven short distances but not alot yet; planning on going back to work - says Dr. Jaynee Eagles is coming over during PT session to talk to him    Patient is accompained by:  Family member    Pertinent History  cerebral cavernous malformation, seizures, ICH, h/o stroke 11/2014, h/o multiple cavernomas, atrial arrhythmia    Patient Stated Goals  "Be able to cope with dizziness and walk downstairs without feeling like I'm going to fall; be able to not have to take the Meclizine"    Currently in Pain?  No/denies                      [facility] Adult PT  Treatment/Exercise - 11/22/16 0001      High Level Balance   High Level Balance Activities  Backward walking;Tandem walking;Figure 8 turns      Neuro Re-ed    Neuro Re-ed Details   Ramp standing facing up/down with marching, alternating foot taps with CGA.  Standing on ramp in roll plane with  marching progressing to eyes closed, however needs mod A to maintain balance.  Marching on level surfaces with eyes closed with min A for safety.  Walking tandem stance with min to mod A due to imbalance.            Balance Exercises - 11/22/16 2056      Balance Exercises: Standing   Standing Eyes Opened  Narrow base of support (BOS);Wide (BOA);Head turns;Foam/compliant surface;Other reps (comment) 10 reps ; also lateral tilt head movement    Standing Eyes Closed  Narrow base of support (BOS);Wide (BOA);Head turns;Foam/compliant surface;Other reps (comment) 10 reps - lateral tilt head turns added with UE support prn  Rockerboard  Anterior/posterior;Lateral;Head turns;EO;EC;10 reps    Gait with Head Turns  Forward;1 rep reading cards 115' around track          PT Short Term Goals - 11/01/16 1234      PT SHORT TERM GOAL #1   Title  Improve TUG score from 14.53 to </= 10 secs without device with c/o dizziness </= 2/10 intensity.    Baseline  8.78 seconds fof TUG at last tx session.    Time  4    Period  Weeks    Status  Achieved      PT SHORT TERM GOAL #2   Title  Increase gait velocity from 2.56 ft/sec to >/= 3.0 ft/sec for incr. gait efficiency.    Baseline  4.1 ft/sec at last session.    Time  4    Period  Weeks    Status  Achieved      PT SHORT TERM GOAL #3   Title  Perform DVA test and establish goal as appropriate.    Baseline  *plan to hold until after gamma knife procedure    Time  4    Period  Weeks    Status  Deferred      PT SHORT TERM GOAL #4   Title  Complete Berg test and set goal as appropriate.    Baseline  36/56 at baseline.    Time  4    Period  Weeks     Status  Achieved      PT SHORT TERM GOAL #5   Title  Negotiate steps with 1 hand rail using a step over step sequence with dizziness </= 2/10    Baseline  *baseline dizziness is 3/10, however steps do not increase dizziness.     Time  4    Period  Weeks    Status  Not Met      PT SHORT TERM GOAL #6   Title  Complete SOT and establish goal as appropriate    Baseline  Scores composite equilibrium score of 48% compared to 72% age/heigh normative value.     Time  4    Period  Weeks    Status  Achieved      PT SHORT TERM GOAL #7   Title  Independent in HEP for balance and vestibular exercises.    Baseline  Met on 11/01/2016    Time  4    Period  Weeks    Status  Achieved        PT Long Term Goals - 11/22/16 2058      PT LONG TERM GOAL #1   Title  Increase gait velocity to >/= 3.5 ft/sec for increased gait efficiency.    Baseline  see short term goal 11/01/16    Status  Achieved      PT LONG TERM GOAL #2   Title  Increase Berg score to at least 50/56 to decrease fall risk.    Baseline  54/56 on 11-16-16    Status  Achieved      PT LONG TERM GOAL #3   Title  Increase DGI to at least 19/24 to reduce fall risk.    Status  On-going      PT LONG TERM GOAL #4   Title  Improve DVA to </= 3 line difference.      Status  Deferred      PT LONG TERM GOAL #5   Title  Report at least 75% improvement in vertigo.  Status  On-going      PT LONG TERM GOAL #6   Title  Independent in updated HEP as appropriate.    Status  On-going            Plan - 11/22/16 2103    Clinical Impression Statement  Pt progressing very well with balance and dynamic gait;  pt is now functional and not at risk for fall.  Pt has returned to driving and plans to return to work - will place pt on HOLD until after surgery planned for 12-19-16 (gamma knife) and re-evaluate after this date    Rehab Potential  Good    Clinical Impairments Affecting Rehab Potential  impaired sensation Lt side of body     PT Frequency  1x / week    PT Duration  8 weeks    PT Treatment/Interventions  ADLs/Self Care Home Management;Neuromuscular re-education;Balance training;Therapeutic exercise;Therapeutic activities;Stair training;Gait training;Patient/family education;Vestibular;Visual/perceptual remediation/compensation    PT Next Visit Plan  pt placed on HOLD until after surgery on 12-19-16 - will re-evaluate need for PT after this date    PT Home Exercise Plan  sit to stand without UE support, amb. in home with head turns; balance on foam EO and EC added on 11-09-16    Consulted and Agree with Plan of Care  Family member/caregiver;Patient       Patient will benefit from skilled therapeutic intervention in order to improve the following deficits and impairments:  Abnormal gait, Decreased activity tolerance, Decreased balance, Decreased coordination, Decreased endurance, Decreased strength, Dizziness, Impaired vision/preception, Impaired sensation  Visit Diagnosis: Dizziness and giddiness  Unsteadiness on feet     Problem List Patient Active Problem List   Diagnosis Date Noted  . Cerebral cavernous malformation 09/28/2016  . Seizures ([place]) 09/28/2016  . Keratoma 09/28/2016  . ICH (intracerebral hemorrhage) ([place]) 09/27/2016  . Hemangioma of skin and subcutaneous tissue 07/07/2016  . Visit for preventive health examination 07/07/2016  . Prostate cancer screening 07/07/2016  . Obstructive sleep apnea 12/23/2015  . Arteriovenous malformation of brain 12/06/2015  . Cavernous hemangioma of brain ([place]) 12/30/2014  . Bipolar disorder ([place]) 12/30/2014  . HTN (hypertension) 12/30/2014    Staley Lunz, [place], PT 11/22/2016, 9:06 PM  [facility] [address] [place], [place], [address] Phone: [phone]   Fax:  [phone]  Name: Todd Harris MRN: [id number] Date of Birth: [birth year]

## 2016-11-28 ENCOUNTER — Ambulatory Visit: Payer: 59 | Admitting: Physical Therapy

## 2016-11-28 ENCOUNTER — Encounter: Payer: 59 | Admitting: Occupational Therapy

## 2016-11-30 ENCOUNTER — Ambulatory Visit: Payer: 59 | Admitting: Physical Therapy

## 2016-11-30 ENCOUNTER — Encounter: Payer: 59 | Admitting: Occupational Therapy

## 2016-12-04 ENCOUNTER — Encounter: Payer: Self-pay | Admitting: Neurology

## 2016-12-05 ENCOUNTER — Encounter: Payer: 59 | Admitting: Occupational Therapy

## 2016-12-05 ENCOUNTER — Ambulatory Visit: Payer: 59 | Admitting: Physical Therapy

## 2016-12-05 DIAGNOSIS — Q282 Arteriovenous malformation of cerebral vessels: Secondary | ICD-10-CM | POA: Diagnosis not present

## 2016-12-05 DIAGNOSIS — Z510 Encounter for antineoplastic radiation therapy: Secondary | ICD-10-CM | POA: Diagnosis not present

## 2016-12-05 DIAGNOSIS — Q283 Other malformations of cerebral vessels: Secondary | ICD-10-CM | POA: Diagnosis not present

## 2016-12-05 HISTORY — PX: OTHER SURGICAL HISTORY: SHX169

## 2016-12-07 ENCOUNTER — Encounter: Payer: 59 | Admitting: Occupational Therapy

## 2016-12-07 ENCOUNTER — Ambulatory Visit: Payer: 59 | Admitting: Physical Therapy

## 2016-12-07 ENCOUNTER — Encounter: Payer: Self-pay | Admitting: Physician Assistant

## 2016-12-12 ENCOUNTER — Ambulatory Visit: Payer: 59 | Admitting: Physical Therapy

## 2016-12-12 ENCOUNTER — Encounter: Payer: 59 | Admitting: Occupational Therapy

## 2016-12-14 ENCOUNTER — Encounter: Payer: 59 | Admitting: Occupational Therapy

## 2016-12-14 ENCOUNTER — Ambulatory Visit: Payer: 59 | Admitting: Physical Therapy

## 2016-12-18 DIAGNOSIS — D485 Neoplasm of uncertain behavior of skin: Secondary | ICD-10-CM | POA: Diagnosis not present

## 2016-12-18 DIAGNOSIS — D2371 Other benign neoplasm of skin of right lower limb, including hip: Secondary | ICD-10-CM | POA: Diagnosis not present

## 2016-12-18 DIAGNOSIS — D2372 Other benign neoplasm of skin of left lower limb, including hip: Secondary | ICD-10-CM | POA: Diagnosis not present

## 2016-12-28 ENCOUNTER — Ambulatory Visit: Payer: 59 | Attending: Physician Assistant | Admitting: Physical Therapy

## 2017-01-03 ENCOUNTER — Encounter: Payer: Self-pay | Admitting: Neurology

## 2017-01-03 ENCOUNTER — Ambulatory Visit (INDEPENDENT_AMBULATORY_CARE_PROVIDER_SITE_OTHER): Payer: 59 | Admitting: Neurology

## 2017-01-03 ENCOUNTER — Telehealth: Payer: Self-pay | Admitting: Neurology

## 2017-01-03 ENCOUNTER — Encounter: Payer: Self-pay | Admitting: *Deleted

## 2017-01-03 VITALS — BP 121/82 | HR 66 | Ht 70.0 in | Wt 201.8 lb

## 2017-01-03 DIAGNOSIS — Q283 Other malformations of cerebral vessels: Secondary | ICD-10-CM | POA: Diagnosis not present

## 2017-01-03 DIAGNOSIS — G40209 Localization-related (focal) (partial) symptomatic epilepsy and epileptic syndromes with complex partial seizures, not intractable, without status epilepticus: Secondary | ICD-10-CM

## 2017-01-03 DIAGNOSIS — I610 Nontraumatic intracerebral hemorrhage in hemisphere, subcortical: Secondary | ICD-10-CM

## 2017-01-03 DIAGNOSIS — D1802 Hemangioma of intracranial structures: Secondary | ICD-10-CM

## 2017-01-03 NOTE — Telephone Encounter (Signed)
Todd Harris, would you write a work letter which includes the following:   Patient has had bleeding int he brain with  resultant focal deficits including vertigo. Recommend frequent breaks if needed and a a sit-stand desk; because if he sits too long in front of the computer with standing or a break he has a difficult time standing and residual vertigo. Also recommend limiting work hours to no more than 40 hours a week at this time but otherwise no restrictions in the workplace.

## 2017-01-03 NOTE — Patient Instructions (Signed)
Lamotrigine tablets What is this medicine? LAMOTRIGINE (la MOE tri jeen) is used to control seizures in adults and children with epilepsy and Lennox-Gastaut syndrome. It is also used in adults to treat bipolar disorder. This medicine may be used for other purposes; ask your health care provider or pharmacist if you have questions. COMMON BRAND NAME(S): Lamictal What should I tell my health care provider before I take this medicine? They need to know if you have any of these conditions: -a history of depression or bipolar disorder -aseptic meningitis during prior use of lamotrigine -folate deficiency -kidney disease -liver disease -suicidal thoughts, plans, or attempt; a previous suicide attempt by you or a family member -an unusual or allergic reaction to lamotrigine or other seizure medications, other medicines, foods, dyes, or preservatives -pregnant or trying to get pregnant -breast-feeding How should I use this medicine? Take this medicine by mouth with a glass of water. Follow the directions on the prescription label. Do not chew these tablets. If this medicine upsets your stomach, take it with food or milk. Take your doses at regular intervals. Do not take your medicine more often than directed. A special MedGuide will be given to you by the pharmacist with each new prescription and refill. Be sure to read this information carefully each time. Talk to your pediatrician regarding the use of this medicine in children. While this drug may be prescribed for children as young as 2 years for selected conditions, precautions do apply. Overdosage: If you think you have taken too much of this medicine contact a poison control center or emergency room at once. NOTE: This medicine is only for you. Do not share this medicine with others. What if I miss a dose? If you miss a dose, take it as soon as you can. If it is almost time for your next dose, take only that dose. Do not take double or extra  doses. What may interact with this medicine? -carbamazepine -male hormones, including contraceptive or birth control pills -methotrexate -phenobarbital -phenytoin -primidone -pyrimethamine -rifampin -trimethoprim -valproic acid This list may not describe all possible interactions. Give your health care provider a list of all the medicines, herbs, non-prescription drugs, or dietary supplements you use. Also tell them if you smoke, drink alcohol, or use illegal drugs. Some items may interact with your medicine. What should I watch for while using this medicine? Visit your doctor or health care professional for regular checks on your progress. If you take this medicine for seizures, wear a Medic Alert bracelet or necklace. Carry an identification card with information about your condition, medicines, and doctor or health care professional. It is important to take this medicine exactly as directed. When first starting treatment, your dose will need to be adjusted slowly. It may take weeks or months before your dose is stable. You should contact your doctor or health care professional if your seizures get worse or if you have any new types of seizures. Do not stop taking this medicine unless instructed by your doctor or health care professional. Stopping your medicine suddenly can increase your seizures or their severity. Contact your doctor or health care professional right away if you develop a rash while taking this medicine. Rashes may be very severe and sometimes require treatment in the hospital. Deaths from rashes have occurred. Serious rashes occur more often in children than adults taking this medicine. It is more common for these serious rashes to occur during the first 2 months of treatment, but a rash can   occur at any time. You may get drowsy, dizzy, or have blurred vision. Do not drive, use machinery, or do anything that needs mental alertness until you know how this medicine affects you.  To reduce dizzy or fainting spells, do not sit or stand up quickly, especially if you are an older patient. Alcohol can increase drowsiness and dizziness. Avoid alcoholic drinks. If you are taking this medicine for bipolar disorder, it is important to report any changes in your mood to your doctor or health care professional. If your condition gets worse, you get mentally depressed, feel very hyperactive or manic, have difficulty sleeping, or have thoughts of hurting yourself or committing suicide, you need to get help from your health care professional right away. If you are a caregiver for someone taking this medicine for bipolar disorder, you should also report these behavioral changes right away. The use of this medicine may increase the chance of suicidal thoughts or actions. Pay special attention to how you are responding while on this medicine. Your mouth may get dry. Chewing sugarless gum or sucking hard candy, and drinking plenty of water may help. Contact your doctor if the problem does not go away or is severe. Women who become pregnant while using this medicine may enroll in the [place] American Antiepileptic Drug Pregnancy Registry by calling 1-888-233-2334. This registry collects information about the safety of antiepileptic drug use during pregnancy. What side effects may I notice from receiving this medicine? Side effects that you should report to your doctor or health care professional as soon as possible: -allergic reactions like skin rash, itching or hives, swelling of the face, lips, or tongue -blurred or double vision -difficulty walking or controlling muscle movements -fever -headache, stiff neck, and sensitivity to light -painful sores in the mouth, eyes, or nose -redness, blistering, peeling or loosening of the skin, including inside the mouth -severe muscle pain -swollen lymph glands -uncontrollable eye movements -unusual bruising or bleeding -unusually weak or  tired -vomiting -worsening of mood, thoughts or actions of suicide or dying -yellowing of the eyes or skin Side effects that usually do not require medical attention (report to your doctor or health care professional if they continue or are bothersome): -diarrhea or constipation -difficulty sleeping -nausea -tremors This list may not describe all possible side effects. Call your doctor for medical advice about side effects. You may report side effects to FDA at 1-800-FDA-1088. Where should I keep my medicine? Keep out of reach of children. Store at room temperature between 15 and 30 degrees C (59 and 86 degrees F). Throw away any unused medicine after the expiration date. NOTE: This sheet is a summary. It may not cover all possible information. If you have questions about this medicine, talk to your doctor, pharmacist, or health care provider.  2018 Elsevier/Gold Standard (2015-01-21 09:29:40)  
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
## 2017-01-03 NOTE — Telephone Encounter (Signed)
Letter signed by MD and given to patient.

## 2017-01-03 NOTE — Telephone Encounter (Signed)
Letter written

## 2017-01-04 ENCOUNTER — Encounter: Payer: 59 | Admitting: Occupational Therapy

## 2017-01-04 ENCOUNTER — Other Ambulatory Visit: Payer: Self-pay | Admitting: Neurology

## 2017-01-04 ENCOUNTER — Ambulatory Visit: Payer: 59 | Admitting: Physical Therapy

## 2017-01-04 MED ORDER — LAMOTRIGINE 25 MG PO TABS: ORAL_TABLET | ORAL | 3 refills | Status: DC

## 2017-01-05 ENCOUNTER — Ambulatory Visit: Payer: 59 | Admitting: Rehabilitative and Restorative Service Providers"

## 2017-01-05 NOTE — Telephone Encounter (Signed)
Faxed signed Lamictal prescription to pt's pharmacy. Received a receipt of confirmation.

## 2017-01-09 ENCOUNTER — Encounter: Payer: 59 | Admitting: Occupational Therapy

## 2017-01-09 ENCOUNTER — Ambulatory Visit: Payer: 59 | Admitting: Physical Therapy

## 2017-01-11 ENCOUNTER — Encounter: Payer: 59 | Admitting: Occupational Therapy

## 2017-01-11 ENCOUNTER — Ambulatory Visit: Payer: 59 | Admitting: Physical Therapy

## 2017-01-16 ENCOUNTER — Encounter: Payer: Self-pay | Admitting: Occupational Therapy

## 2017-01-16 DIAGNOSIS — R2681 Unsteadiness on feet: Secondary | ICD-10-CM

## 2017-01-16 NOTE — Therapy (Signed)
[facility] [address] [place], [place], [address] Phone: [phone]   Fax:  [phone]  Occupational Therapy Treatment  Patient Details  Name: Todd Harris MRN: [id number] Date of Birth: 1960/03/19 Referring Provider: Dr. Sarina Ill   Encounter Date: 01/16/2017    Past Medical History:  Diagnosis Date  . Anxiety   . Atrial arrhythmia   . Barrett esophagus   . Bipolar 1 disorder ([place])   . Cavernous angioma   . Depression   . History of chicken pox   . Hypertension   . Seizures ([place])    1 in 2005, 1 in 2016   . Sleep apnea   . Sleep apnea   . Stroke ([place]) 11/2014   No residual deficits  . Venous malformation    Brain    Past Surgical History:  Procedure Laterality Date  . APPENDECTOMY  1970   age 57  . bone spur     age 57  . kidney stones    . RAD ONC MR BRAIN GAMMA KNIFE  12/05/2016  . TONSILLECTOMY  1968    There were no vitals filed for this visit.                          OT Short Term Goals - 01/16/17 1346      OT SHORT TERM GOAL #1   Title  Pt will be mod I with HEP for visual vestibular, LUE coordination and strength - 11/07/2016    Status  Achieved      OT SHORT TERM GOAL #2   Title  Assess 9 hole peg and set goal as appropriate    Status  Achieved      OT SHORT TERM GOAL #3   Title  Pt will rate disequilibrium no higher than 5/10 with functional activities requiring head turns, bending over (i.e. home mgmt tasks)    Status  Unable to assess 10/31/2016 pt with recent new small bleed - pt with worsening balance and symptoms       OT SHORT TERM GOAL #4   Title  Pt will report no episodes of diplopia when reading    Status  Achieved      OT SHORT TERM GOAL #5   Title  Pt will report greater ease in buttoning shirt with L hand    Status  Achieved      OT SHORT TERM GOAL #6   Title  Pt will demonstrate improved LUE coordinaton as evidenced by decreasing  time on 9 hole peg by at least 5 seconds to aide in functional activity (baseline=42.10)    Status  Achieved 10/31/2016 L = 31.58        OT Long Term Goals - 01/16/17 1346      OT LONG TERM GOAL #1   Title  Pt will be mod I with upgraded HEP - 12/05/2016    Status  Unable to assess      OT LONG TERM GOAL #2   Title  Pt will demostrate ability to lift 6 pound object x3 with LUE to overhead shelf without dropping.    Status  Unable to assess      OT LONG TERM GOAL #3   Title  Pt will rate disequilibrium sensation 3/10 or less with home mgmt tasks that require head turns, head nods, full body turns and bending over.     Status  Unable to assess  OT LONG TERM GOAL #4   Title  Pt will demonstrate ability to manuever on uneven outdoor surfaces with transitonal head/eye movement as prep to return to yard work with disequilibrium rated no more than 3/10.    Status  Unable to assess      OT LONG TERM GOAL #5   Title  Pt will demonstrate improved coordination LUE as evidenced by decreasing time on 9 hole peg to under 30 seconds to aide in functional activity (baseline= 42.10)    Status  Unable to assess            Plan - 01/16/17 1347    Clinical Impression Statement  Pt had placed on hold for surgery and did not return to therapy. Will d/c from OT       Patient will benefit from skilled therapeutic intervention in order to improve the following deficits and impairments:     Visit Diagnosis: Unsteadiness on feet    Problem List Patient Active Problem List   Diagnosis Date Noted  . Cerebral cavernous malformation 09/28/2016  . Seizures ([place]) 09/28/2016  . Keratoma 09/28/2016  . ICH (intracerebral hemorrhage) ([place]) 09/27/2016  . Hemangioma of skin and subcutaneous tissue 07/07/2016  . Visit for preventive health examination 07/07/2016  . Prostate cancer screening 07/07/2016  . Obstructive sleep apnea 12/23/2015  . Arteriovenous malformation of brain 12/06/2015  .  Cavernous hemangioma of brain ([place]) 12/30/2014  . Bipolar disorder ([place]) 12/30/2014  . HTN (hypertension) 12/30/2014   OCCUPATIONAL THERAPY DISCHARGE SUMMARY  Visits from Start of Care: 5  Current functional level related to goals / functional outcomes: See above   Remaining deficits: High level balance deficits   Education / Equipment: HEP  Plan: Patient agrees to discharge.  Patient goals were partially met. Patient is being discharged due to not returning since the last visit.  ?????      Quay Burow, OTR/L 01/16/2017, 1:47 PM  [facility] [address] [place], [place], [address] Phone: [phone]   Fax:  [phone]  Name: BREVYN RING MRN: [id number] Date of Birth: February 12, 1960

## 2017-01-24 ENCOUNTER — Encounter: Payer: Self-pay | Admitting: Neurology

## 2017-01-26 ENCOUNTER — Other Ambulatory Visit: Payer: Self-pay | Admitting: Physician Assistant

## 2017-02-20 ENCOUNTER — Encounter: Payer: Self-pay | Admitting: Neurology

## 2017-02-20 DIAGNOSIS — R200 Anesthesia of skin: Secondary | ICD-10-CM

## 2017-03-08 ENCOUNTER — Encounter: Payer: Self-pay | Admitting: Neurology

## 2017-03-20 ENCOUNTER — Encounter: Payer: Self-pay | Admitting: Neurology

## 2017-03-20 ENCOUNTER — Telehealth: Payer: Self-pay | Admitting: Neurology

## 2017-03-20 DIAGNOSIS — D1802 Hemangioma of intracranial structures: Secondary | ICD-10-CM | POA: Diagnosis not present

## 2017-03-20 DIAGNOSIS — R531 Weakness: Secondary | ICD-10-CM | POA: Diagnosis not present

## 2017-03-20 DIAGNOSIS — I618 Other nontraumatic intracerebral hemorrhage: Secondary | ICD-10-CM | POA: Diagnosis not present

## 2017-03-20 DIAGNOSIS — G939 Disorder of brain, unspecified: Secondary | ICD-10-CM | POA: Diagnosis not present

## 2017-03-20 DIAGNOSIS — R200 Anesthesia of skin: Secondary | ICD-10-CM | POA: Diagnosis not present

## 2017-03-20 DIAGNOSIS — I10 Essential (primary) hypertension: Secondary | ICD-10-CM | POA: Diagnosis not present

## 2017-03-20 DIAGNOSIS — Q283 Other malformations of cerebral vessels: Secondary | ICD-10-CM | POA: Diagnosis not present

## 2017-03-20 DIAGNOSIS — R51 Headache: Secondary | ICD-10-CM | POA: Diagnosis not present

## 2017-03-20 NOTE — Telephone Encounter (Signed)
Pt called wanting to inform Dr.Ahern that he is heading to [facility] ED stating his symptoms from his previous mychart email have worsened. Considering the pts was heading to the ED he didn't have much time to talk further. FYI

## 2017-03-21 ENCOUNTER — Encounter: Payer: Self-pay | Admitting: Neurology

## 2017-03-21 MED ORDER — DIAZEPAM 5 MG PO TABS
5.00 | ORAL_TABLET | ORAL | Status: DC
Start: ? — End: 2017-03-21

## 2017-03-21 MED ORDER — BUPROPION HCL ER (XL) 150 MG PO TB24
150.00 | ORAL_TABLET | ORAL | Status: DC
Start: 2017-03-22 — End: 2017-03-21

## 2017-03-21 MED ORDER — ACETAMINOPHEN 325 MG PO TABS
650.00 | ORAL_TABLET | ORAL | Status: DC
Start: ? — End: 2017-03-21

## 2017-03-21 MED ORDER — LAMOTRIGINE 100 MG PO TABS
100.00 | ORAL_TABLET | ORAL | Status: DC
Start: 2017-03-21 — End: 2017-03-21

## 2017-03-21 MED ORDER — HYDROCHLOROTHIAZIDE 25 MG PO TABS
25.00 | ORAL_TABLET | ORAL | Status: DC
Start: 2017-03-22 — End: 2017-03-21

## 2017-03-21 MED ORDER — LISINOPRIL 20 MG PO TABS
20.00 | ORAL_TABLET | ORAL | Status: DC
Start: 2017-03-22 — End: 2017-03-21

## 2017-03-21 NOTE — Telephone Encounter (Signed)
Spoke pt & offered pt an appt on Monday 03/26/17. He was unable to come that day and he asked for the week after. RN offered the pt an appt on Tuesday 04/03/17 @ 2:30 arrival time 2:00. He verbalized agreement. Scheduled pt for f/u. He verbalized appreciation.

## 2017-03-21 NOTE — Telephone Encounter (Signed)
Pt has been discharged from the ED but was advised to schedule an appt with Dr. Jaynee Eagles. Pt requesting a call back to discuss and set up an appt.

## 2017-03-22 ENCOUNTER — Encounter: Payer: Self-pay | Admitting: *Deleted

## 2017-03-22 NOTE — Telephone Encounter (Signed)
Everything was negative, in fact it looked better. I really have no more to talk to patient about however we can see him.

## 2017-04-03 ENCOUNTER — Ambulatory Visit: Payer: Self-pay | Admitting: Neurology

## 2017-04-09 ENCOUNTER — Encounter: Payer: Self-pay | Admitting: Neurology

## 2017-04-19 ENCOUNTER — Ambulatory Visit: Payer: 59 | Admitting: Neurology

## 2017-04-27 ENCOUNTER — Other Ambulatory Visit: Payer: Self-pay | Admitting: *Deleted

## 2017-04-27 MED ORDER — LAMOTRIGINE 100 MG PO TABS: 100.0000 mg | ORAL_TABLET | Freq: Two times a day (BID) | ORAL | 0 refills | Status: DC

## 2017-04-27 NOTE — Telephone Encounter (Signed)
Received Lamotrigine prescription request from [facility] Spoke with pt. He stated his insurance is now requiring that he use [facility] instead of [facility]. He stated that he completed the full titration about 4 weeks ago and is now taking Lamotrigine 100 mg twice daily with the 25 mg tablets. He would like to switch to a 100 mg tablet twice daily. RN informed pt that she would get this taken care of and inform work-in MD of the change request as Dr. Jaynee Eagles is currently out of the office. Pt verbalized appreciation and stated he has about 5 days left of the medication.

## 2017-04-27 NOTE — Telephone Encounter (Signed)
Ok to change per Dr. Jaynee Eagles. Lamotrigine 100 mg BID e-scribed to [facility].

## 2017-04-30 ENCOUNTER — Telehealth: Payer: Self-pay | Admitting: Emergency Medicine

## 2017-04-30 NOTE — Telephone Encounter (Signed)
Please advise if patient needs a titer or be re-vaccinated.  Copied from [place]. Topic: General - Other >> Apr 27, 2017  2:05 PM Oneta Rack wrote: Relation to pt: self  Call back [phone]  Reason for call:  Patient  inquiring about 2nd MMR vaccination due to his age, please advise. >> Apr 27, 2017  2:11 PM Oneta Rack wrote: Relation to pt: self  Call back [phone]  Reason for call:  Patient  inquiring about 2nd MMR vaccination due to his age, please advise.

## 2017-04-30 NOTE — Telephone Encounter (Signed)
Please call patient to discuss. Not common for adult to call about MMR vaccine unless for travel/work, etc. Can you make sure that this is what he is truly requesting? Would recommend a titer first to see if he has sufficient antibodies.

## 2017-04-30 NOTE — Telephone Encounter (Signed)
Spoke with patient and advised that usually adults doesn't get a 2nd MMR vaccination. He is agreeable with getting the titer for the MMR to make sure he is immune to MMR. Will complete at follow up in May.

## 2017-05-02 ENCOUNTER — Encounter: Payer: Self-pay | Admitting: Physician Assistant

## 2017-05-09 ENCOUNTER — Telehealth: Payer: Self-pay | Admitting: Neurology

## 2017-05-09 ENCOUNTER — Encounter: Payer: Self-pay | Admitting: Neurology

## 2017-05-09 NOTE — Telephone Encounter (Signed)
Thank you :)

## 2017-05-09 NOTE — Telephone Encounter (Signed)
Todd Harris, [name] saw Dr. Brett Fairy in the past. Do we have access to his compliance report and, if so, can you get it and make sure he is compliant and everything looks ok?

## 2017-05-09 NOTE — Telephone Encounter (Signed)
Todd Harris was set up with a auto CPAP in 2017. His last 30 days shows he is 100% compliant with great treatment of his apnea.

## 2017-05-15 ENCOUNTER — Encounter: Payer: Self-pay | Admitting: Neurology

## 2017-05-17 ENCOUNTER — Ambulatory Visit (INDEPENDENT_AMBULATORY_CARE_PROVIDER_SITE_OTHER): Payer: 59 | Admitting: Neurology

## 2017-05-17 ENCOUNTER — Encounter: Payer: Self-pay | Admitting: Neurology

## 2017-05-17 VITALS — BP 118/76 | HR 67 | Ht 70.0 in | Wt 218.0 lb

## 2017-05-17 DIAGNOSIS — I613 Nontraumatic intracerebral hemorrhage in brain stem: Secondary | ICD-10-CM | POA: Diagnosis not present

## 2017-05-17 DIAGNOSIS — G4701 Insomnia due to medical condition: Secondary | ICD-10-CM | POA: Diagnosis not present

## 2017-05-17 DIAGNOSIS — G4700 Insomnia, unspecified: Secondary | ICD-10-CM | POA: Insufficient documentation

## 2017-05-17 DIAGNOSIS — Z9989 Dependence on other enabling machines and devices: Secondary | ICD-10-CM | POA: Diagnosis not present

## 2017-05-17 DIAGNOSIS — G4733 Obstructive sleep apnea (adult) (pediatric): Secondary | ICD-10-CM | POA: Diagnosis not present

## 2017-05-17 MED ORDER — ZALEPLON 5 MG PO CAPS: 5.0000 mg | ORAL_CAPSULE | Freq: Every evening | ORAL | 1 refills | Status: DC | PRN
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
## 2017-05-17 NOTE — Patient Instructions (Signed)
Zaleplon capsules What is this medicine? ZALEPLON (ZAL e plon) is used to treat insomnia. This medicine helps you to fall asleep. This medicine may be used for other purposes; ask your health care provider or pharmacist if you have questions. COMMON BRAND NAME(S): Sonata What should I tell my health care provider before I take this medicine? They need to know if you have any of these conditions: -depression -history of a drug or alcohol abuse problem -liver disease -lung or breathing disease -suicidal thoughts -an unusual or allergic reaction to zaleplon, other medicines, foods, dyes, or preservatives -pregnant or trying to get pregnant -breast-feeding How should I use this medicine? Take this medicine by mouth with a glass of water. Follow the directions on the prescription label. It is better to take this medicine on an empty stomach and only when you are ready for bed. Do not take your medicine more often than directed. If you have been taking this medicine for several weeks and suddenly stop taking it, you may get unpleasant withdrawal symptoms. Your doctor or health care professional may want to gradually reduce the dose. Do not stop taking this medicine on your own. Always follow your doctor or health care professional's advice. Talk to your pediatrician regarding the use of this medicine in children. Special care may be needed. Overdosage: If you think you have taken too much of this medicine contact a poison control center or emergency room at once. NOTE: This medicine is only for you. Do not share this medicine with others. What if I miss a dose? This does not apply. This medicine should only be taken immediately before going to sleep. Do not take double or extra doses. What may interact with this medicine? -barbiturate medicines for inducing sleep or treating seizures -carbamazepine -certain medicines for allergies, like azatadine, clemastine, diphenhydramine -certain medicines  for depression, anxiety, or other emotional or psychiatric problems -certain medicines for pain -cimetidine -erythromycin -medicines for fungal infections like ketoconazole, fluconazole, or itraconazole -other medicines given for sleep -phenytoin -rifampin This list may not describe all possible interactions. Give your health care provider a list of all the medicines, herbs, non-prescription drugs, or dietary supplements you use. Also tell them if you smoke, drink alcohol, or use illegal drugs. Some items may interact with your medicine. What should I watch for while using this medicine? Visit your doctor or health care professional for regular checks on your progress. Keep a regular sleep schedule by going to bed at about the same time each night. Avoid caffeine-containing drinks in the evening hours. When sleep medicines are used every night for more than a few weeks, they may stop working. Talk to your doctor if you still have trouble sleeping. After taking this medicine for sleep, you may get up out of bed while not being fully awake and do an activity that you do not know you are doing. The next morning, you may have no memory of the event. Activities such as driving a car ("sleep-driving"), making and eating food, talking on the phone, sexual activity, and sleep-walking have been reported. Call your doctor right away if you find out you have done any of these activities. Do not take this medicine if you have used alcohol that evening or before bed or taken another medicine for sleep, since your risk of doing these sleep-related activities will be increased. Do not take this medicine unless you are able to stay in bed for a full night (7 to 8 hours) before you must   be active again. You may have a decrease in mental alertness the day after use, even if you feel that you are fully awake. Tell your doctor if you will need to perform activities requiring full alertness, such as driving, the next day. Do  not stand or sit up quickly after taking this medicine, especially if you are an older patient. This reduces the risk of dizzy or fainting spells. If you or your family notice any changes in your behavior, such as new or worsening depression, thoughts of harming yourself, anxiety, other unusual or disturbing thoughts, or memory loss, call your doctor right away. After you stop taking this medicine, you may have trouble falling asleep. This is called rebound insomnia. This problem usually goes away on its own after 1 or 2 nights. What side effects may I notice from receiving this medicine? Side effects that you should report to your doctor or health care professional as soon as possible: -allergic reactions like skin rash, itching or hives, swelling of the face, lips, or tongue -breathing problems -changes in vision -confusion -depression, suicidal thoughts -feeling faint or lightheaded -hallucinations -hostility, restlessness, excitability -slurred speech -staggering, tremors -unusual activities while asleep like driving, eating, making phone calls -unusually weak or tired Side effects that usually do not require medical attention (report to your doctor or health care professional if they continue or are bothersome): -diarrhea -difficulty with coordination -loss of memory -nightmares -stomach upset This list may not describe all possible side effects. Call your doctor for medical advice about side effects. You may report side effects to FDA at 1-800-FDA-1088. Where should I keep my medicine? Keep out of the reach of children. This medicine can be abused. Keep your medicine in a safe place to protect it from theft. Do not share this medicine with anyone. Selling or giving away this medicine is dangerous and against the law. This medicine may cause accidental overdose and death if taken by other adults, children, or pets. Mix any unused medicine with a substance like cat litter or coffee  grounds. Then throw the medicine away in a sealed container like a sealed bag or a coffee can with a lid. Do not use the medicine after the expiration date. Store at room temperature between 20 and 25 degrees C (68 and 77 degrees F). Protect from light. NOTE: This sheet is a summary. It may not cover all possible information. If you have questions about this medicine, talk to your doctor, pharmacist, or health care provider.  2018 Elsevier/Gold Standard (2013-09-09 15:43:05)  

## 2017-05-18 ENCOUNTER — Other Ambulatory Visit: Payer: Self-pay

## 2017-05-18 ENCOUNTER — Encounter: Payer: Self-pay | Admitting: Physician Assistant

## 2017-05-18 ENCOUNTER — Ambulatory Visit (INDEPENDENT_AMBULATORY_CARE_PROVIDER_SITE_OTHER): Payer: 59 | Admitting: Physician Assistant

## 2017-05-18 VITALS — BP 108/72 | HR 74 | Temp 98.3°F | Resp 15 | Ht 69.5 in | Wt 217.0 lb

## 2017-05-18 DIAGNOSIS — Z0184 Encounter for antibody response examination: Secondary | ICD-10-CM | POA: Diagnosis not present

## 2017-05-18 DIAGNOSIS — I10 Essential (primary) hypertension: Secondary | ICD-10-CM | POA: Diagnosis not present

## 2017-05-18 NOTE — Progress Notes (Signed)
Patient presents to clinic today for follow-up of hypertension.  Patient with history of hypertension, sleep apnea, CVA 2/2 pontine hemorrhage. Is followed by Neurology. Is currently on a regimen of lisinopril-HCTZ 20-25 mg daily. Is taking as directed. Patient denies chest pain, palpitations, lightheadedness, dizziness, vision changes or frequent headaches. Has follow-up scheduled with Neurology.   BP Readings from Last 3 Encounters:  05/18/17 108/72  05/17/17 118/76  01/03/17 121/82    Past Medical History:  Diagnosis Date  . Anxiety   . Atrial arrhythmia   . Barrett esophagus   . Bipolar 1 disorder ([place])   . Cavernous angioma   . Depression   . History of chicken pox   . Hypertension   . Seizures ([place])    1 in 2005, 1 in 2016   . Sleep apnea   . Sleep apnea   . Stroke ([place]) 11/2014   No residual deficits  . Venous malformation    Brain    Current Outpatient Medications on File Prior to Visit  Medication Sig Dispense Refill  . buPROPion (WELLBUTRIN XL) 150 MG 24 hr tablet Take 150 mg by mouth daily.    . diazepam (VALIUM) 5 MG tablet Take 5 mg by mouth daily.     Marland Kitchen lamoTRIgine (LAMICTAL) 100 MG tablet Take 1 tablet (100 mg total) by mouth 2 (two) times daily. 60 tablet 0  . lisinopril-hydrochlorothiazide (PRINZIDE,ZESTORETIC) 20-25 MG tablet TAKE 1 TABLET DAILY 90 tablet 1  . VRAYLAR capsule Take 1.5 mg by mouth daily.  1  . zaleplon (SONATA) 5 MG capsule Take 1 capsule (5 mg total) by mouth at bedtime as needed for sleep. 30 capsule 1   No current facility-administered medications on file prior to visit.     No Known Allergies  Family History  Problem Relation Age of Onset  . Aneurysm Maternal Grandmother   . Asthma Mother   . Diabetes Mother   . Hypertension Mother   . Diabetes Brother   . Diabetes Brother   . Pancreatic cancer Father   . Diabetes Father   . Cancer Father        Pancreatic  . Diabetes Paternal Grandmother   . Diabetes Paternal  Grandfather   . Colon cancer Neg Hx     Social History   Socioeconomic History  . Marital status: Married    Spouse name: [name]   . Number of children: 3  . Years of education: 16+  . Highest education level: Not on file  Occupational History  . 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 Financial resource strain: Not on file  . Food insecurity:    Worry: Not on file    Inability: Not on file  . Transportation needs:    Medical: Not on file    Non-medical: Not on file  Tobacco Use  . Smoking status: Never Smoker  . Smokeless tobacco: Never Used  Substance and Sexual Activity  . Alcohol use: No    Alcohol/week: 0.0 oz    Frequency: Never    Comment: was 4-5 beers/week  . Drug use: No  . Sexual activity: Yes  Lifestyle  . Physical activity:    Days per week: Not on file    Minutes per session: Not on file  . Stress: Not on file  Relationships  . Social connections:    Talks on phone: Not on file    Gets together: Not on file    Attends religious service: Not  on file    Active member of club or organization: Not on file    Attends meetings of clubs or organizations: Not on file    Relationship status: Not on file  Other Topics Concern  . Not on file  Social History Narrative   Lives in [place].    Married. 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      Caffeine use: 2 cup coffee per day   Review of Systems - See HPI.  All other ROS are negative.  BP 108/72   Pulse 74   Temp 98.3 F (36.8 C) (Oral)   Resp 15   Ht 5' 9.5" (1.765 m)   Wt 217 lb (98.4 kg)   SpO2 96%   BMI 31.59 kg/m   Physical Exam  Constitutional: He is oriented to person, place, and time. He appears well-developed and well-nourished.  HENT:  Head: Normocephalic and atraumatic.  Eyes: Conjunctivae are normal.  Neck: Neck supple.  Cardiovascular: Normal rate, regular rhythm and normal heart sounds.  Pulmonary/Chest: Effort normal.    Neurological: He is alert and oriented to person, place, and time.  Skin: Skin is warm.  Psychiatric: He has a normal mood and affect.  Vitals reviewed.  Assessment/Plan: HTN (hypertension) BP normotensive. Asymptomatic. Continue current regimen. Patient to return for fasting CPE so we can reassess lipids. Is working hard on diet and activity level. Exercise is limited due to residual effects of stroke. Recommended water aerobics at home with supervision since they have a pool.    Leeanne Rio, PA-C

## 2017-05-18 NOTE — Patient Instructions (Addendum)
Please go to the lab today for blood work.  I will call you with your results. We will alter treatment regimen(s) if indicated by your results.   Please continue current medication regimen. Call psychiatrist regarding the Vraylar.   Try to start daily water aerobics once you get the pool up and running. This will help with overall health and weight. Try to follow the recommendations below.  For the suspected chigger bites, please apply OTC hydrocortisone lotion and Sarna lotion to help with itch and inflammation. Wash well. Limit areas of potential exposure to the chiggers. Symptoms should calm down in a couple of weeks. If there are any new areas, call me.   I will see you in July for physical.   Preventing Unhealthy Weight Gain, Adult Staying at a healthy weight is important. When fat builds up in your body, you may become overweight or obese. These conditions put you at greater risk for developing certain health problems, such as heart disease, diabetes, sleeping problems, joint problems, and some cancers. Unhealthy weight gain is often the result of making unhealthy choices in what you eat. It is also a result of not getting enough exercise. You can make changes to your lifestyle to prevent obesity and stay as healthy as possible. What nutrition changes can be made? To maintain a healthy weight and prevent obesity:  Eat only as much as your body needs. To do this: ? Pay attention to signs that you are hungry or full. Stop eating as soon as you feel full. ? If you feel hungry, try drinking water first. Drink enough water so your urine is clear or pale yellow. ? Eat smaller portions. ? Look at serving sizes on food labels. Most foods contain more than one serving per container. ? Eat the recommended amount of calories for your gender and activity level. While most active people should eat around 2,000 calories per day, if you are trying to lose weight or are not very active, you main need  to eat less calories. Talk to your health care provider or dietitian about how many calories you should eat each day.  Choose healthy foods, such as: ? Fruits and vegetables. Try to fill at least half of your plate at each meal with fruits and vegetables. ? Whole grains, such as whole wheat bread, brown rice, and quinoa. ? Lean meats, such as chicken or fish. ? Other healthy proteins, such as beans, eggs, or tofu. ? Healthy fats, such as nuts, seeds, fatty fish, and olive oil. ? Low-fat or fat-free dairy.  Check food labels and avoid food and drinks that: ? Are high in calories. ? Have added sugar. ? Are high in sodium. ? Have saturated fats or trans fats.  Limit how much you eat of the following foods: ? Prepackaged meals. ? Fast food. ? Fried foods. ? Processed meat, such as bacon, sausage, and deli meats. ? Fatty cuts of red meat and poultry with skin.  Cook foods in healthier ways, such as by baking, broiling, or grilling.  When grocery shopping, try to shop around the outside of the store. This helps you buy mostly fresh foods and avoid canned and prepackaged foods.  What lifestyle changes can be made?  Exercise at least 30 minutes 5 or more days each week. Exercising includes brisk walking, yard work, biking, running, swimming, and team sports like basketball and soccer. Ask your health care provider which exercises are safe for you.  Do not use any products that  contain nicotine or tobacco, such as cigarettes and e-cigarettes. If you need help quitting, ask your health care provider.  Limit alcohol intake to no more than 1 drink a day for nonpregnant women and 2 drinks a day for men. One drink equals 12 oz of beer, 5 oz of wine, or 1 oz of hard liquor.  Try to get 7-9 hours of sleep each night. What other changes can be made?  Keep a food and activity journal to keep track of: ? What you ate and how many calories you had. Remember to count sauces, dressings, and side  dishes. ? Whether you were active, and what exercises you did. ? Your calorie, weight, and activity goals.  Check your weight regularly. Track any changes. If you notice you have gained weight, make changes to your diet or activity routine.  Avoid taking weight-loss medicines or supplements. Talk to your health care provider before starting any new medicine or supplement.  Talk to your health care provider before trying any new diet or exercise plan. Why are these changes important? Eating healthy, staying active, and having healthy habits not only help prevent obesity, they also:  Help you to manage stress and emotions.  Help you to connect with friends and family.  Improve your self-esteem.  Improve your sleep.  Prevent long-term health problems.  What can happen if changes are not made? Being obese or overweight can cause you to develop joint or bone problems, which can make it hard for you to stay active or do activities you enjoy. Being obese or overweight also puts stress on your heart and lungs and can lead to health problems like diabetes, heart disease, and some cancers. Where to find more information: Talk with your health care provider or a dietitian about healthy eating and healthy lifestyle choices. You may also find other information through these resources:  U.S. Department of Agriculture MyPlate: FormerBoss.no  American Heart Association: www.heart.org  Centers for Disease Control and Prevention: http://www.wolf.info/  Summary  Staying at a healthy weight is important. It helps prevent certain diseases and health problems, such as heart disease, diabetes, joint problems, sleep disorders, and some cancers.  Being obese or overweight can cause you to develop joint or bone problems, which can make it hard for you to stay active or do activities you enjoy.  You can prevent unhealthy weight gain by eating a healthy diet, exercising regularly, not smoking, limiting  alcohol, and getting enough sleep.  Talk with your health care provider or a dietitian for guidance about healthy eating and healthy lifestyle choices. This information is not intended to replace advice given to you by your health care provider. Make sure you discuss any questions you have with your health care provider. Document Released: 12/21/2015 Document Revised: 01/26/2016 Document Reviewed: 01/26/2016 Elsevier Interactive Patient Education  [employer].

## 2017-05-21 LAB — COMPREHENSIVE METABOLIC PANEL
AG Ratio: 2.4 (calc) (ref 1.0–2.5)
ALBUMIN MSPROF: 4.6 g/dL (ref 3.6–5.1)
ALKALINE PHOSPHATASE (APISO): 46 U/L (ref 40–115)
ALT: 13 U/L (ref 9–46)
AST: 11 U/L (ref 10–35)
BUN: 20 mg/dL (ref 7–25)
CALCIUM: 9.8 mg/dL (ref 8.6–10.3)
CO2: 27 mmol/L (ref 20–32)
Chloride: 103 mmol/L (ref 98–110)
Creat: 0.87 mg/dL (ref 0.70–1.33)
GLOBULIN: 1.9 g/dL (ref 1.9–3.7)
Glucose, Bld: 130 mg/dL — ABNORMAL HIGH (ref 65–99)
Potassium: 3.9 mmol/L (ref 3.5–5.3)
Sodium: 139 mmol/L (ref 135–146)
TOTAL PROTEIN: 6.5 g/dL (ref 6.1–8.1)
Total Bilirubin: 0.3 mg/dL (ref 0.2–1.2)

## 2017-05-21 LAB — MEASLES/MUMPS/RUBELLA IMMUNITY
MUMPS IGG: 53.4 [AU]/ml
RUBEOLA IGG: 39.3 [AU]/ml
Rubella: 19.3 index

## 2017-05-21 NOTE — Assessment & Plan Note (Signed)
BP normotensive. Asymptomatic. Continue current regimen. Patient to return for fasting CPE so we can reassess lipids. Is working hard on diet and activity level. Exercise is limited due to residual effects of stroke. Recommended water aerobics at home with supervision since they have a pool.

## 2017-06-06 ENCOUNTER — Other Ambulatory Visit: Payer: Self-pay | Admitting: Neurology

## 2017-06-06 ENCOUNTER — Encounter: Payer: Self-pay | Admitting: Neurology

## 2017-06-07 ENCOUNTER — Telehealth: Payer: Self-pay | Admitting: *Deleted

## 2017-06-07 NOTE — Telephone Encounter (Signed)
Received a request for Lisinopril prescription from [facility]. Prescription denied as this has been prescribed in past by Raiford Noble and should continue being managed by PCP. Form signed by Dr. Jaynee Eagles and faxed to [facility]. Received a receipt of confirmation.

## 2017-07-18 DIAGNOSIS — D1802 Hemangioma of intracranial structures: Secondary | ICD-10-CM | POA: Diagnosis not present

## 2017-07-18 DIAGNOSIS — Q283 Other malformations of cerebral vessels: Secondary | ICD-10-CM | POA: Diagnosis not present

## 2017-07-18 DIAGNOSIS — Z9889 Other specified postprocedural states: Secondary | ICD-10-CM | POA: Diagnosis not present

## 2017-07-18 DIAGNOSIS — D1800 Hemangioma unspecified site: Secondary | ICD-10-CM | POA: Diagnosis not present

## 2017-07-20 ENCOUNTER — Other Ambulatory Visit: Payer: Self-pay | Admitting: Physician Assistant

## 2017-07-25 ENCOUNTER — Encounter: Payer: Self-pay | Admitting: Neurology

## 2017-08-13 ENCOUNTER — Other Ambulatory Visit: Payer: Self-pay | Admitting: Neurology

## 2017-08-13 ENCOUNTER — Other Ambulatory Visit: Payer: Self-pay | Admitting: Physician Assistant

## 2017-08-13 MED ORDER — LISINOPRIL-HYDROCHLOROTHIAZIDE 20-25 MG PO TABS: 1.0000 | ORAL_TABLET | Freq: Every day | ORAL | 1 refills | Status: DC

## 2017-09-24 ENCOUNTER — Ambulatory Visit (INDEPENDENT_AMBULATORY_CARE_PROVIDER_SITE_OTHER): Payer: 59 | Admitting: Physician Assistant

## 2017-09-24 ENCOUNTER — Encounter: Payer: Self-pay | Admitting: Physician Assistant

## 2017-09-24 ENCOUNTER — Ambulatory Visit (INDEPENDENT_AMBULATORY_CARE_PROVIDER_SITE_OTHER): Payer: 59

## 2017-09-24 ENCOUNTER — Other Ambulatory Visit: Payer: Self-pay

## 2017-09-24 VITALS — BP 110/78 | HR 66 | Temp 98.1°F | Resp 14 | Ht 69.5 in | Wt 225.0 lb

## 2017-09-24 DIAGNOSIS — S99921A Unspecified injury of right foot, initial encounter: Secondary | ICD-10-CM

## 2017-09-24 DIAGNOSIS — S92511A Displaced fracture of proximal phalanx of right lesser toe(s), initial encounter for closed fracture: Secondary | ICD-10-CM | POA: Diagnosis not present

## 2017-09-24 DIAGNOSIS — Z23 Encounter for immunization: Secondary | ICD-10-CM

## 2017-09-24 NOTE — Patient Instructions (Signed)
Please go to the [facility] office for imaging. The address is [address] [place]  [place], [place]   Tylenol for pain. Wear the boot as directed. Keep iced and elevated.  We will change regimen based on imaging results.

## 2017-09-24 NOTE — Progress Notes (Signed)
Patient presents to clinic today c/o injury to toe of R foot occurring this weekend. Patient endorses his german shepherd puppy jumped on him, causing him to lose balance and fall backwards. Did not hit had but felt immediate pain in the 2nd tow of his R foot. Has noted significant brusiing, mild swelling and moderate pain since that time. Denies numbness or tingling. Denies ankle pain or swelling. Otherwise doing well..  Past Medical History:  Diagnosis Date  . Anxiety   . Atrial arrhythmia   . Barrett esophagus   . Bipolar 1 disorder ([place])   . Cavernous angioma   . Depression   . History of chicken pox   . Hypertension   . Seizures ([place])    1 in 2005, 1 in 2016   . Sleep apnea   . Sleep apnea   . Stroke ([place]) 11/2014   No residual deficits  . Venous malformation    Brain    Current Outpatient Medications on File Prior to Visit  Medication Sig Dispense Refill  . buPROPion (WELLBUTRIN XL) 150 MG 24 hr tablet Take 150 mg by mouth daily.    . diazepam (VALIUM) 5 MG tablet Take 5 mg by mouth daily.     Marland Kitchen lamoTRIgine (LAMICTAL) 100 MG tablet TAKE 1 TABLET BY MOUTH TWO  TIMES DAILY 180 tablet 1  . lisinopril-hydrochlorothiazide (PRINZIDE,ZESTORETIC) 20-25 MG tablet Take 1 tablet by mouth daily. 90 tablet 1  . VRAYLAR capsule Take 1.5 mg by mouth daily.  1  . zaleplon (SONATA) 5 MG capsule Take 1 capsule (5 mg total) by mouth at bedtime as needed for sleep. (Patient not taking: Reported on 09/24/2017) 30 capsule 1   No current facility-administered medications on file prior to visit.     No Known Allergies  Family History  Problem Relation Age of Onset  . Aneurysm Maternal Grandmother   . Asthma Mother   . Diabetes Mother   . Hypertension Mother   . Diabetes Brother   . Diabetes Brother   . Pancreatic cancer Father   . Diabetes Father   . Cancer Father        Pancreatic  . Diabetes Paternal Grandmother   . Diabetes Paternal Grandfather   . Colon cancer Neg Hx      Social History   Socioeconomic History  . Marital status: Married    Spouse name: [name]   . Number of children: 3  . Years of education: 16+  . Highest education level: Not on file  Occupational History  . 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 Financial resource strain: Not on file  . Food insecurity:    Worry: Not on file    Inability: Not on file  . Transportation needs:    Medical: Not on file    Non-medical: Not on file  Tobacco Use  . Smoking status: Never Smoker  . Smokeless tobacco: Never Used  Substance and Sexual Activity  . Alcohol use: No    Alcohol/week: 0.0 standard drinks    Frequency: Never    Comment: was 4-5 beers/week  . Drug use: No  . Sexual activity: Yes  Lifestyle  . Physical activity:    Days per week: Not on file    Minutes per session: Not on file  . Stress: Not on file  Relationships  . Social connections:    Talks on phone: Not on file    Gets together: Not on file    Attends  religious service: Not on file    Active member of club or organization: Not on file    Attends meetings of clubs or organizations: Not on file    Relationship status: Not on file  Other Topics Concern  . Not on file  Social History Narrative   Lives in [place].    Married. 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      Caffeine use: 2 cup coffee per day   Review of Systems - See HPI.  All other ROS are negative.  BP 110/78   Pulse 66   Temp 98.1 F (36.7 C) (Oral)   Resp 14   Ht 5' 9.5" (1.765 m)   Wt 225 lb (102.1 kg)   SpO2 99%   BMI 32.75 kg/m   Physical Exam  Constitutional: He is oriented to person, place, and time. He appears well-developed and well-nourished.  HENT:  Head: Normocephalic and atraumatic.  Cardiovascular: Normal rate, regular rhythm, normal heart sounds and intact distal pulses.  Musculoskeletal:       Feet:  Neurological: He is alert and oriented to person, place, and time.   Vitals reviewed.  Assessment/Plan: 1. Toe injury, right, initial encounter X-ray confirms fracture of 2nd MTP. Boot applied. Recommend RICE. Tylenol. Follow-up with me in 1 week for reassessment.  - DG Foot Complete Right; Future  2. Need for immunization against influenza Flu shot updated today. - Flu Vaccine QUAD 36+ mos IM   Leeanne Rio, PA-C

## 2017-10-01 ENCOUNTER — Ambulatory Visit (INDEPENDENT_AMBULATORY_CARE_PROVIDER_SITE_OTHER): Payer: 59 | Admitting: Physician Assistant

## 2017-10-01 ENCOUNTER — Other Ambulatory Visit: Payer: Self-pay

## 2017-10-01 ENCOUNTER — Encounter: Payer: Self-pay | Admitting: Physician Assistant

## 2017-10-01 VITALS — BP 110/78 | HR 67 | Temp 98.1°F | Resp 14 | Ht 69.5 in | Wt 225.0 lb

## 2017-10-01 DIAGNOSIS — S92514D Nondisplaced fracture of proximal phalanx of right lesser toe(s), subsequent encounter for fracture with routine healing: Secondary | ICD-10-CM

## 2017-10-01 NOTE — Progress Notes (Signed)
Patient presents to clinic today for follow-up regarding fracture. At last visit, xray revealed fracture at the base of the 2nd proximal phalanx. No other fracture noted. Patient was placed in a post-op boot and giving instructions for care, including OTC medication. Patient endorses following instructions given. Notes swelling has most completely resolved and bruising is fading away. Has been taking Tylenol on occasion but overall doing very well. Denies new or worsening symptoms. Has been ambulating without issue. Pain is 1/10 currently.   Past Medical History:  Diagnosis Date  . Anxiety   . Atrial arrhythmia   . Barrett esophagus   . Bipolar 1 disorder ([place])   . Cavernous angioma   . Depression   . History of chicken pox   . Hypertension   . Seizures ([place])    1 in 2005, 1 in 2016   . Sleep apnea   . Sleep apnea   . Stroke ([place]) 11/2014   No residual deficits  . Venous malformation    Brain    Current Outpatient Medications on File Prior to Visit  Medication Sig Dispense Refill  . buPROPion (WELLBUTRIN XL) 150 MG 24 hr tablet Take 150 mg by mouth daily.    . diazepam (VALIUM) 5 MG tablet Take 5 mg by mouth daily.     Marland Kitchen lamoTRIgine (LAMICTAL) 100 MG tablet TAKE 1 TABLET BY MOUTH TWO  TIMES DAILY 180 tablet 1  . lisinopril-hydrochlorothiazide (PRINZIDE,ZESTORETIC) 20-25 MG tablet Take 1 tablet by mouth daily. 90 tablet 1  . VRAYLAR capsule Take 1.5 mg by mouth daily.  1  . zaleplon (SONATA) 5 MG capsule Take 1 capsule (5 mg total) by mouth at bedtime as needed for sleep. 30 capsule 1   No current facility-administered medications on file prior to visit.     No Known Allergies  Family History  Problem Relation Age of Onset  . Aneurysm Maternal Grandmother   . Asthma Mother   . Diabetes Mother   . Hypertension Mother   . Diabetes Brother   . Diabetes Brother   . Pancreatic cancer Father   . Diabetes Father   . Cancer Father        Pancreatic  . Diabetes Paternal  Grandmother   . Diabetes Paternal Grandfather   . Colon cancer Neg Hx     Social History   Socioeconomic History  . Marital status: Married    Spouse name: [name]   . Number of children: 3  . Years of education: 16+  . Highest education level: Not on file  Occupational History  . 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 Financial resource strain: Not on file  . Food insecurity:    Worry: Not on file    Inability: Not on file  . Transportation needs:    Medical: Not on file    Non-medical: Not on file  Tobacco Use  . Smoking status: Never Smoker  . Smokeless tobacco: Never Used  Substance and Sexual Activity  . Alcohol use: No    Alcohol/week: 0.0 standard drinks    Frequency: Never    Comment: was 4-5 beers/week  . Drug use: No  . Sexual activity: Yes  Lifestyle  . Physical activity:    Days per week: Not on file    Minutes per session: Not on file  . Stress: Not on file  Relationships  . Social connections:    Talks on phone: Not on file    Gets together: Not  on file    Attends religious service: Not on file    Active member of club or organization: Not on file    Attends meetings of clubs or organizations: Not on file    Relationship status: Not on file  Other Topics Concern  . Not on file  Social History Narrative   Lives in [place].    Married. 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      Caffeine use: 2 cup coffee per day   Review of Systems - See HPI.  All other ROS are negative.  BP 110/78   Pulse 67   Temp 98.1 F (36.7 C) (Oral)   Resp 14   Ht 5' 9.5" (1.765 m)   Wt 225 lb (102.1 kg)   SpO2 98%   BMI 32.75 kg/m   Physical Exam  Constitutional: He appears well-developed and well-nourished.  HENT:  Head: Normocephalic and atraumatic.  Cardiovascular: Normal rate, regular rhythm, normal heart sounds and intact distal pulses.  Pulmonary/Chest: Effort normal and breath sounds normal.    Musculoskeletal:       Feet:  Vitals reviewed.    Assessment/Plan: 1. Closed nondisplaced fracture of proximal phalanx of lesser toe of right foot with routine healing, subsequent encounter Doing well. Area buddy taped. Continue supportive footwear and OTC analgesics. Reassurance given. Follow-up for any worsening symptoms.    Leeanne Rio, PA-C

## 2017-10-01 NOTE — Patient Instructions (Signed)
Please keep foot elevated and iced while resting.  Tylenol if needed for pain. Keep the  2nd and 3rd tows buddy taped when walking.  This should heal over the next 3-4 weeks. If you note any new or worsening symptoms, please come see [place].

## 2017-10-08 ENCOUNTER — Other Ambulatory Visit: Payer: Self-pay

## 2017-10-08 MED ORDER — BUPROPION HCL ER (XL) 150 MG PO TB24: 150.0000 mg | ORAL_TABLET | Freq: Every day | ORAL | 0 refills | Status: DC

## 2017-10-16 ENCOUNTER — Telehealth: Payer: Self-pay | Admitting: Psychiatry

## 2017-10-16 DIAGNOSIS — F411 Generalized anxiety disorder: Secondary | ICD-10-CM

## 2017-10-16 MED ORDER — DIAZEPAM 5 MG PO TABS: 5.0000 mg | ORAL_TABLET | Freq: Every day | ORAL | 1 refills | Status: DC

## 2017-10-16 NOTE — Telephone Encounter (Signed)
Fax request [facility] for refill of ongoing diazepam medically necessary with no contraindication diversion or misuse.  Diazepam 5 mg every morning #90 with1 refill Escribed.

## 2017-11-04 ENCOUNTER — Other Ambulatory Visit: Payer: Self-pay | Admitting: Psychiatry

## 2017-12-06 ENCOUNTER — Encounter: Payer: Self-pay | Admitting: Emergency Medicine

## 2017-12-06 DIAGNOSIS — F3173 Bipolar disorder, in partial remission, most recent episode manic: Secondary | ICD-10-CM

## 2017-12-06 DIAGNOSIS — F411 Generalized anxiety disorder: Secondary | ICD-10-CM | POA: Insufficient documentation

## 2017-12-06 DIAGNOSIS — F3132 Bipolar disorder, current episode depressed, moderate: Secondary | ICD-10-CM | POA: Insufficient documentation

## 2017-12-20 ENCOUNTER — Encounter: Payer: Self-pay | Admitting: Psychiatry

## 2017-12-20 ENCOUNTER — Ambulatory Visit (INDEPENDENT_AMBULATORY_CARE_PROVIDER_SITE_OTHER): Payer: 59 | Admitting: Psychiatry

## 2017-12-20 VITALS — BP 112/82 | HR 68 | Ht 69.0 in | Wt 223.0 lb

## 2017-12-20 DIAGNOSIS — G4701 Insomnia due to medical condition: Secondary | ICD-10-CM | POA: Diagnosis not present

## 2017-12-20 DIAGNOSIS — F3173 Bipolar disorder, in partial remission, most recent episode manic: Secondary | ICD-10-CM | POA: Diagnosis not present

## 2017-12-20 DIAGNOSIS — F411 Generalized anxiety disorder: Secondary | ICD-10-CM | POA: Diagnosis not present

## 2017-12-20 NOTE — Progress Notes (Signed)
[facility]  Patient ID: Todd Harris,  MRN: [id number]  PCP: Brunetta Jeans, PA-C  Date of Evaluation: 12/20/2017 Time spent:20 minutes  Chief Complaint:  Chief Complaint    Anxiety; Depression; Manic Behavior      HISTORY/CURRENT STATUS: Taiki is seen individually face-to-face with consent not collateral for psychiatric interview and exam in 40-month evaluation and management of bipolar 1 and generalized anxiety disorders with sleep apnea and medication effect in the setting of serious comorbid cranial vascular vulnerability to stroke with hypertension borderline.  He reviews gamma knife ablation of cavernous angiomas in the brain that bled in his pontine area having residual unsteady gait and hypnesthesia extensively.  He is therefore less active and has gained 2 pounds though still taking his antihypertensive though no longer his Sonata.  He expects an MRI in July again and is not scheduled for further gamma knife treatment though he has extensive angiomas.  He has had seizures in the past associated with bleeding from the angiomas.  He has recently experienced some mild depression after having manic symptoms preceding last appointment.  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  Anxiety  Presents for follow-up visit. Symptoms include decreased concentration, depressed mood, dizziness, excessive worry, malaise, muscle tension and nervous/anxious behavior. Patient reports no insomnia, palpitations or suicidal ideas. Symptoms occur most days. The severity of symptoms is interfering with daily activities and causing significant distress. The quality of sleep is fair. Nighttime awakenings: occasional.   Compliance with medications is 76-100%.  Side effects of treatment include headaches.  Depression         This is a recurrent problem.  The current episode started more than 1 year ago.   The onset quality is sudden.   The problem occurs intermittently.  The problem has been gradually improving since onset.  Associated symptoms include decreased concentration, fatigue, headaches and sad.  Associated symptoms include no hopelessness, does not have insomnia, no decreased interest and no suicidal ideas.     The symptoms are aggravated by work stress, medication and social issues.  Past treatments include other medications.  Compliance with treatment is good.  Past compliance problems include difficulty with treatment plan, medication issues, medical issues and insurance issues.  Previous treatment provided moderate relief.  Past medical history includes anxiety.     Individual Medical History/ Review of Systems: Changes? :No   Allergies: Patient has no known allergies.  Current Medications:  Current Outpatient Medications:  .  buPROPion (WELLBUTRIN XL) 150 MG 24 hr tablet, Take 1 tablet (150 mg total) by mouth daily., Disp: 90 tablet, Rfl: 0 .  diazepam (VALIUM) 5 MG tablet, Take 1 tablet (5 mg total) by mouth daily., Disp: 90 tablet, Rfl: 1 .  lamoTRIgine (LAMICTAL) 100 MG tablet, TAKE 1 TABLET BY MOUTH TWO  TIMES DAILY, Disp: 180 tablet, Rfl: 1 .  lisinopril-hydrochlorothiazide (PRINZIDE,ZESTORETIC) 20-25 MG tablet, Take 1 tablet by mouth daily., Disp: 90 tablet, Rfl: 1 .  VRAYLAR capsule, TAKE 1 CAPSULE BY MOUTH  EVERY DAY, Disp: 90 capsule, Rfl: 0 .  zaleplon (SONATA) 5 MG capsule, Take 1 capsule (5 mg total) by mouth at bedtime as needed for sleep., Disp: 30 capsule, Rfl: 1 Medication Side Effects: none  Family Medical/ Social History:  Changes? Yes 2 of his children have married in the interim and he is busy preparing for Christmas.  MENTAL HEALTH EXAM: Strength 5/5, postural reflexes 0/0 and AIMS equals 0. Blood pressure 112/82,  pulse 68, height 5\' 9"  (1.753 m), weight 223 lb (101.2 kg).Body mass index is 32.93 kg/m.  General Appearance: Casual, Fairly Groomed and Obese  Eye Contact:  Good  Speech:  Clear and Coherent  Volume:  Normal  Mood:  Anxious and Dysphoric  Affect:  Depressed, Inappropriate, Full Range and Anxious  Thought Process:  Goal Directed  Orientation:  Full (Time, Place, and Person)  Thought Content: Obsessions and Rumination   Suicidal Thoughts:  No  Homicidal Thoughts:  No  Memory:  Immediate;   Fair Remote;   Good  Judgement:  Good  Insight:  Good  Psychomotor Activity:  Decreased  Concentration:  Concentration: Fair and Attention Span: Fair  Recall:  [employer] of Knowledge: Good  Language: Good  Assets:  Desire for Improvement Financial Resources/Insurance Housing  ADL's:  Intact  Cognition: WNL  Prognosis:  Fair    DIAGNOSES:    ICD-10-CM   1. Bipolar I disorder, current or most recent episode manic, in partial remission with anxious distress ([place]) F31.73   2. GAD (generalized anxiety disorder) F41.1   3. Insomnia due to medical condition G47.01     Receiving Psychotherapy: No    RECOMMENDATIONS: Intensive reassessment clarifies current dysphoria as seasonal, attendant and appropriate to mourning and loss for neurological function and ability to work, and limitation of potential solutions for his medical problems.  He expresses gratitude and strength from his relationship with all providers including here and declines further supportive psychotherapy.  He continues his lisinopril hydrochlorothiazide 20/25 but is no longer taking Sonata 5 mg.  His psychiatric medications from here include current supply of diazepam 5 mg every morning for anxiety, Lamictal 100 mg twice daily for seizures and bipolar disorder, Vraylar 1.5 mg daily for bipolar disorder, and bupropion 150 mg XL every morning for anxiety, depression, focus, and impulse control.  He expects an online replacement for  [facility] with his company merger for the start of the year and will notify the office when and where refills for his 4 medications need to be sent to otherwise return in 6 months.   Delight Hoh, MD

## 2017-12-31 ENCOUNTER — Other Ambulatory Visit: Payer: Self-pay | Admitting: Physician Assistant

## 2017-12-31 ENCOUNTER — Other Ambulatory Visit: Payer: Self-pay | Admitting: Psychiatry

## 2018-01-03 ENCOUNTER — Ambulatory Visit: Payer: 59 | Admitting: Neurology

## 2018-01-09 ENCOUNTER — Ambulatory Visit: Payer: 59 | Admitting: Neurology

## 2018-01-18 ENCOUNTER — Other Ambulatory Visit: Payer: Self-pay | Admitting: Emergency Medicine

## 2018-01-18 ENCOUNTER — Encounter: Payer: Self-pay | Admitting: Emergency Medicine

## 2018-01-18 DIAGNOSIS — I10 Essential (primary) hypertension: Secondary | ICD-10-CM

## 2018-01-18 MED ORDER — LISINOPRIL-HYDROCHLOROTHIAZIDE 20-25 MG PO TABS: ORAL_TABLET | ORAL | 0 refills | Status: DC

## 2018-01-22 ENCOUNTER — Other Ambulatory Visit: Payer: Self-pay | Admitting: Neurology

## 2018-02-01 ENCOUNTER — Encounter: Payer: Self-pay | Admitting: Physician Assistant

## 2018-02-01 ENCOUNTER — Ambulatory Visit (INDEPENDENT_AMBULATORY_CARE_PROVIDER_SITE_OTHER): Payer: Managed Care, Other (non HMO) | Admitting: Physician Assistant

## 2018-02-01 ENCOUNTER — Other Ambulatory Visit: Payer: Self-pay

## 2018-02-01 ENCOUNTER — Ambulatory Visit (INDEPENDENT_AMBULATORY_CARE_PROVIDER_SITE_OTHER): Payer: Managed Care, Other (non HMO) | Admitting: Neurology

## 2018-02-01 ENCOUNTER — Encounter: Payer: Self-pay | Admitting: Neurology

## 2018-02-01 VITALS — BP 130/76 | HR 72 | Ht 69.0 in | Wt 225.0 lb

## 2018-02-01 VITALS — BP 120/80 | HR 69 | Temp 98.4°F | Resp 14 | Ht 69.0 in | Wt 224.0 lb

## 2018-02-01 DIAGNOSIS — I613 Nontraumatic intracerebral hemorrhage in brain stem: Secondary | ICD-10-CM

## 2018-02-01 DIAGNOSIS — H814 Vertigo of central origin: Secondary | ICD-10-CM

## 2018-02-01 DIAGNOSIS — G4701 Insomnia due to medical condition: Secondary | ICD-10-CM

## 2018-02-01 DIAGNOSIS — G4733 Obstructive sleep apnea (adult) (pediatric): Secondary | ICD-10-CM

## 2018-02-01 DIAGNOSIS — F3173 Bipolar disorder, in partial remission, most recent episode manic: Secondary | ICD-10-CM

## 2018-02-01 DIAGNOSIS — I10 Essential (primary) hypertension: Secondary | ICD-10-CM | POA: Diagnosis not present

## 2018-02-01 DIAGNOSIS — Z0000 Encounter for general adult medical examination without abnormal findings: Secondary | ICD-10-CM | POA: Diagnosis not present

## 2018-02-01 DIAGNOSIS — Z9989 Dependence on other enabling machines and devices: Secondary | ICD-10-CM

## 2018-02-01 DIAGNOSIS — Q283 Other malformations of cerebral vessels: Secondary | ICD-10-CM | POA: Diagnosis not present

## 2018-02-01 DIAGNOSIS — I619 Nontraumatic intracerebral hemorrhage, unspecified: Secondary | ICD-10-CM

## 2018-02-01 DIAGNOSIS — Z125 Encounter for screening for malignant neoplasm of prostate: Secondary | ICD-10-CM

## 2018-02-01 DIAGNOSIS — R42 Dizziness and giddiness: Secondary | ICD-10-CM | POA: Insufficient documentation

## 2018-02-01 LAB — HEMOGLOBIN A1C: Hgb A1c MFr Bld: 5.9 % (ref 4.6–6.5)

## 2018-02-01 LAB — LDL CHOLESTEROL, DIRECT: LDL DIRECT: 138 mg/dL

## 2018-02-01 LAB — COMPREHENSIVE METABOLIC PANEL
ALT: 25 U/L (ref 0–53)
AST: 17 U/L (ref 0–37)
Albumin: 4.8 g/dL (ref 3.5–5.2)
Alkaline Phosphatase: 47 U/L (ref 39–117)
BUN: 25 mg/dL — ABNORMAL HIGH (ref 6–23)
CHLORIDE: 100 meq/L (ref 96–112)
CO2: 28 meq/L (ref 19–32)
CREATININE: 1.1 mg/dL (ref 0.40–1.50)
Calcium: 10.1 mg/dL (ref 8.4–10.5)
GFR: 68.54 mL/min (ref >60.00)
Glucose, Bld: 110 mg/dL — ABNORMAL HIGH (ref 70–99)
POTASSIUM: 4.8 meq/L (ref 3.5–5.1)
SODIUM: 138 meq/L (ref 135–145)
Total Bilirubin: 0.5 mg/dL (ref 0.2–1.2)
Total Protein: 6.6 g/dL (ref 6.0–8.3)

## 2018-02-01 LAB — CBC WITH DIFFERENTIAL/PLATELET
Basophils Absolute: 0 10*3/uL (ref 0.0–0.1)
Basophils Relative: 0.6 % (ref 0.0–3.0)
EOS ABS: 0.2 10*3/uL (ref 0.0–0.7)
Eosinophils Relative: 3.1 % (ref 0.0–5.0)
HCT: 47.4 % (ref 39.0–52.0)
Hemoglobin: 15.7 g/dL (ref 13.0–17.0)
Lymphocytes Relative: 18.1 % (ref 12.0–46.0)
Lymphs Abs: 1.4 10*3/uL (ref 0.7–4.0)
MCHC: 33.1 g/dL (ref 30.0–36.0)
MCV: 89.1 fl (ref 78.0–100.0)
MONO ABS: 0.5 10*3/uL (ref 0.1–1.0)
Monocytes Relative: 6.7 % (ref 3.0–12.0)
NEUTROS ABS: 5.7 10*3/uL (ref 1.4–7.7)
Neutrophils Relative %: 71.5 % (ref 43.0–77.0)
PLATELETS: 202 10*3/uL (ref 150.0–400.0)
RBC: 5.31 Mil/uL (ref 4.22–5.81)
RDW: 13.5 % (ref 11.5–15.5)
WBC: 7.9 10*3/uL (ref 4.0–10.5)

## 2018-02-01 LAB — LIPID PANEL
CHOL/HDL RATIO: 7
CHOLESTEROL: 233 mg/dL — AB (ref 0–200)
HDL: 33.5 mg/dL — AB (ref >39.00)
NonHDL: 199.37
TRIGLYCERIDES: 301 mg/dL — AB (ref 0.0–149.0)
VLDL: 60.2 mg/dL — AB (ref 0.0–40.0)

## 2018-02-01 LAB — PSA: PSA: 1.33 ng/mL (ref 0.10–4.00)

## 2018-02-01 MED ORDER — LAMOTRIGINE 100 MG PO TABS: 100.0000 mg | ORAL_TABLET | Freq: Two times a day (BID) | ORAL | 6 refills | Status: DC

## 2018-02-01 MED ORDER — MECLIZINE HCL 25 MG PO TABS: 25.0000 mg | ORAL_TABLET | Freq: Three times a day (TID) | ORAL | 6 refills | Status: DC | PRN

## 2018-02-01 NOTE — Progress Notes (Signed)
Patient presents to clinic today for annual exam.  Patient is fasting for labs.  Diet -- Very well-balanced diet overall. Does not typically eat breakfast during weekdays. Weekend is evening snacking though. Is trying to work on this. Snacks usually either cashews or sometimes salami or chips.   Exercise -- Harder to do certain exercises after his stroke. Can only do about 15 minutes on the treadmill at a time. Has a standup desk at work. Takes the stairs at work.   Acute Concerns: Denies acute concerns today.  Chronic Issues: Bipolar Disorder I -- Currently on a regimen of Wellbutrin 150 XL QD, Lamotrigine 100 mg QD, Vraylar QD and Valium 5 mg PRN. Followed by Psychiatry and sees them every 3-6 months.  History of ICH/Pontine Hemorrhage -- 2/2 cavernous hemangioma of brain. Managed by Neurology. Is seeing them later today to follow-up regarding some ongoing intermittent vertigo.  Health Maintenance: Immunizations -- up-to-date. Colonoscopy -- up-to-date.  Past Medical History:  Diagnosis Date  . Anxiety   . Atrial arrhythmia   . Barrett esophagus   . Bipolar 1 disorder ([place])   . Cavernous angioma   . Depression   . History of chicken pox   . Hypertension   . Seizures ([place])    1 in 2005, 1 in 2016   . Sleep apnea   . Sleep apnea   . Stroke ([place]) 11/2014   No residual deficits  . Venous malformation    Brain    Past Surgical History:  Procedure Laterality Date  . APPENDECTOMY  1970   age 58  . bone spur     age 58  . kidney stones    . RAD ONC MR BRAIN GAMMA KNIFE  12/05/2016  . TONSILLECTOMY  1968    Current Outpatient Medications on File Prior to Visit  Medication Sig Dispense Refill  . buPROPion (WELLBUTRIN XL) 150 MG 24 hr tablet TAKE 1 TABLET BY MOUTH  DAILY 90 tablet 1  . diazepam (VALIUM) 5 MG tablet Take 1 tablet (5 mg total) by mouth daily. 90 tablet 1  . lamoTRIgine (LAMICTAL) 100 MG tablet TAKE 1 TABLET BY MOUTH TWO  TIMES DAILY 180 tablet 1  .  lisinopril-hydrochlorothiazide (PRINZIDE,ZESTORETIC) 20-25 MG tablet Call office [phone] for appointment for further refills. 90 tablet 0  . VRAYLAR capsule TAKE 1 CAPSULE BY MOUTH  EVERY DAY 90 capsule 0  . zaleplon (SONATA) 5 MG capsule TAKE ONE CAPSULE BY MOUTH AT BEDTIME AS NEEDED FOR SLEEP 30 capsule 1   No current facility-administered medications on file prior to visit.     No Known Allergies  Family History  Problem Relation Age of Onset  . Aneurysm Maternal Grandmother   . Asthma Mother   . Diabetes Mother   . Hypertension Mother   . Diabetes Brother   . Diabetes Brother   . Pancreatic cancer Father   . Diabetes Father   . Cancer Father        Pancreatic  . Diabetes Paternal Grandmother   . Diabetes Paternal Grandfather   . Colon cancer Neg Hx     Social History   Socioeconomic History  . Marital status: Married    Spouse name: [name]   . Number of children: 3  . Years of education: 16+  . Highest education level: Not on file  Occupational History  . 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 Financial resource strain: Not on file  . Food insecurity:  Worry: Not on file    Inability: Not on file  . Transportation needs:    Medical: Not on file    Non-medical: Not on file  Tobacco Use  . Smoking status: Never Smoker  . Smokeless tobacco: Never Used  Substance and Sexual Activity  . Alcohol use: No    Alcohol/week: 0.0 standard drinks    Frequency: Never    Comment: was 4-5 beers/week  . Drug use: No  . Sexual activity: Yes  Lifestyle  . Physical activity:    Days per week: Not on file    Minutes per session: Not on file  . Stress: Not on file  Relationships  . Social connections:    Talks on phone: Not on file    Gets together: Not on file    Attends religious service: Not on file    Active member of club or organization: Not on file    Attends meetings of clubs or organizations: Not on file    Relationship status: Not on  file  . Intimate partner violence:    Fear of current or ex partner: Not on file    Emotionally abused: Not on file    Physically abused: Not on file    Forced sexual activity: Not on file  Other Topics Concern  . Not on file  Social History Narrative   Lives in [place].    Married. 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      Caffeine use: 2 cup coffee per day   Review of Systems  Constitutional: Negative for fever and weight loss.  HENT: Negative for ear discharge, ear pain, hearing loss and tinnitus.   Eyes: Negative for blurred vision, double vision, photophobia and pain.  Respiratory: Negative for cough and shortness of breath.   Cardiovascular: Negative for chest pain and palpitations.  Gastrointestinal: Negative for abdominal pain, blood in stool, constipation, diarrhea, heartburn, melena, nausea and vomiting.  Genitourinary: Negative for dysuria, flank pain, frequency, hematuria and urgency.  Musculoskeletal: Negative for falls.  Neurological: Negative for dizziness, loss of consciousness and headaches.  Endo/Heme/Allergies: Negative for environmental allergies.  Psychiatric/Behavioral: Negative for depression, hallucinations, substance abuse and suicidal ideas. The patient is not nervous/anxious and does not have insomnia.    BP 120/80   Pulse 69   Temp 98.4 F (36.9 C) (Oral)   Resp 14   Ht 5\' 9"  (1.753 m)   Wt 224 lb (101.6 kg)   SpO2 96%   BMI 33.08 kg/m   Physical Exam Vitals signs reviewed.  Constitutional:      General: He is not in acute distress.    Appearance: He is well-developed. He is not diaphoretic.  HENT:     Head: Normocephalic and atraumatic.     Right Ear: Tympanic membrane, ear canal and external ear normal.     Left Ear: Tympanic membrane, ear canal and external ear normal.     Nose: Nose normal.     Mouth/Throat:     Pharynx: No posterior oropharyngeal erythema.  Eyes:     Conjunctiva/sclera: Conjunctivae  normal.     Pupils: Pupils are equal, round, and reactive to light.  Neck:     Musculoskeletal: Neck supple.     Thyroid: No thyromegaly.  Cardiovascular:     Rate and Rhythm: Normal rate and regular rhythm.     Heart sounds: Normal heart sounds.  Pulmonary:     Effort: Pulmonary effort is normal.  No respiratory distress.     Breath sounds: Normal breath sounds. No wheezing or rales.  Chest:     Chest wall: No tenderness.  Abdominal:     General: Bowel sounds are normal. There is no distension.     Palpations: Abdomen is soft. There is no mass.     Tenderness: There is no abdominal tenderness. There is no guarding or rebound.  Lymphadenopathy:     Cervical: No cervical adenopathy.  Skin:    General: Skin is warm and dry.     Findings: No rash.  Neurological:     Mental Status: He is alert and oriented to person, place, and time.     Cranial Nerves: No cranial nerve deficit.     Comments: Strength 5/5 of right upper and lower extremities. Strength 4/5 of left upper and lower extremities (proximal and distal) -- chronic.    Assessment/Plan: 1. Visit for preventive health examination Depression screen negative. Health Maintenance reviewed Preventive schedule discussed and handout given in AVS. Will obtain fasting labs today.  - CBC with Differential/Platelet - Comprehensive metabolic panel - Hemoglobin A1c - Lipid panel  2. Prostate cancer screening The natural history of prostate cancer and ongoing controversy regarding screening and potential treatment outcomes of prostate cancer has been discussed with the patient. The meaning of a false positive PSA and a false negative PSA has been discussed. He indicates understanding of the limitations of this screening test and wishes to proceed with screening PSA testing.  - PSA  3. Essential hypertension Stable. Continue current regimen. Repeat labs today. - Comprehensive metabolic panel - Hemoglobin A1c - Lipid panel  4.  Right-sided nontraumatic intracerebral hemorrhage of brainstem (HCC) Doing well overall. Still with chronic weakness of left extremities. Balance steady. Occasional dizziness. OVS are negative.   5. OSA on CPAP Continue current treatment.  6. Insomnia due to medical condition Doing well with Sonata. Sleep hygiene practices reviewed.  7. Bipolar I disorder, current or most recent episode manic, in partial remission with anxious distress [facility]) Managed by Psychiatry. Doing very well. Continue care as directed by specialist.     Leeanne Rio, PA-C
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
## 2018-02-01 NOTE — Patient Instructions (Signed)
-Please go to the lab for blood work.  -Our office will call you with your results unless you have chosen to receive results via MyChart. -If your blood work is normal we will follow-up each year for physicals and as scheduled for chronic medical problems. -If anything is abnormal we will treat accordingly and get you in for a follow-up.   I will call you with results. I am going to send a note to Dr. Jaynee Eagles who you are seeing later!   Preventive Care 40-64 Years, Male Preventive care refers to lifestyle choices and visits with your health care provider that can promote health and wellness. What does preventive care include?   A yearly physical exam. This is also called an annual well check.  Dental exams once or twice a year.  Routine eye exams. Ask your health care provider how often you should have your eyes checked.  Personal lifestyle choices, including: ? Daily care of your teeth and gums. ? Regular physical activity. ? Eating a healthy diet. ? Avoiding tobacco and drug use. ? Limiting alcohol use. ? Practicing safe sex. ? Taking low-dose aspirin every day starting at age 59. What happens during an annual well check? The services and screenings done by your health care provider during your annual well check will depend on your age, overall health, lifestyle risk factors, and family history of disease. Counseling Your health care provider may ask you questions about your:  Alcohol use.  Tobacco use.  Drug use.  Emotional well-being.  Home and relationship well-being.  Sexual activity.  Eating habits.  Work and work Statistician. Screening You may have the following tests or measurements:  Height, weight, and BMI.  Blood pressure.  Lipid and cholesterol levels. These may be checked every 5 years, or more frequently if you are over 46 years old.  Skin check.  Lung cancer screening. You may have this screening every year starting at age 59 if you have a  30-pack-year history of smoking and currently smoke or have quit within the past 15 years.  Colorectal cancer screening. All adults should have this screening starting at age 59 and continuing until age 65. Your health care provider may recommend screening at age 59. You will have tests every 1-10 years, depending on your results and the type of screening test. People at increased risk should start screening at an earlier age. Screening tests may include: ? Guaiac-based fecal occult blood testing. ? Fecal immunochemical test (FIT). ? Stool DNA test. ? Virtual colonoscopy. ? Sigmoidoscopy. During this test, a flexible tube with a tiny camera (sigmoidoscope) is used to examine your rectum and lower colon. The sigmoidoscope is inserted through your anus into your rectum and lower colon. ? Colonoscopy. During this test, a long, thin, flexible tube with a tiny camera (colonoscope) is used to examine your entire colon and rectum.  Prostate cancer screening. Recommendations will vary depending on your family history and other risks.  Hepatitis C blood test.  Hepatitis B blood test.  Sexually transmitted disease (STD) testing.  Diabetes screening. This is done by checking your blood sugar (glucose) after you have not eaten for a while (fasting). You may have this done every 1-3 years. Discuss your test results, treatment options, and if necessary, the need for more tests with your health care provider. Vaccines Your health care provider may recommend certain vaccines, such as:  Influenza vaccine. This is recommended every year.  Tetanus, diphtheria, and acellular pertussis (Tdap, Td) vaccine. You may  need a Td booster every 10 years.  Varicella vaccine. You may need this if you have not been vaccinated.  Zoster vaccine. You may need this after age 59.  Measles, mumps, and rubella (MMR) vaccine. You may need at least one dose of MMR if you were born in 1957 or later. You may also need a second  dose.  Pneumococcal 13-valent conjugate (PCV13) vaccine. You may need this if you have certain conditions and have not been vaccinated.  Pneumococcal polysaccharide (PPSV23) vaccine. You may need one or two doses if you smoke cigarettes or if you have certain conditions.  Meningococcal vaccine. You may need this if you have certain conditions.  Hepatitis A vaccine. You may need this if you have certain conditions or if you travel or work in places where you may be exposed to hepatitis A.  Hepatitis B vaccine. You may need this if you have certain conditions or if you travel or work in places where you may be exposed to hepatitis B.  Haemophilus influenzae type b (Hib) vaccine. You may need this if you have certain risk factors. Talk to your health care provider about which screenings and vaccines you need and how often you need them. This information is not intended to replace advice given to you by your health care provider. Make sure you discuss any questions you have with your health care provider. Document Released: 01/15/2015 Document Revised: 02/08/2017 Document Reviewed: 10/20/2014 Elsevier Interactive Patient Education  2019 [employer].

## 2018-02-01 NOTE — Patient Instructions (Signed)
Continue Lamictal Will order MRI brain repeat Meclizine for dizziness Physical therapy for vestibular therapy Continue using cpap  Meclizine tablets or capsules What is this medicine? MECLIZINE (MEK li zeen) is an antihistamine. It is used to prevent nausea, vomiting, or dizziness caused by motion sickness. It is also used to prevent and treat vertigo (extreme dizziness or a feeling that you or your surroundings are tilting or spinning around). This medicine may be used for other purposes; ask your health care provider or pharmacist if you have questions. COMMON BRAND NAME(S): Antivert, Dramamine Less Drowsy, Dramamine-N, Medivert, Meni-D What should I tell my health care provider before I take this medicine? They need to know if you have any of these conditions: -glaucoma -lung or breathing disease, like asthma -problems urinating -prostate disease -stomach or intestine problems -an unusual or allergic reaction to meclizine, other medicines, foods, dyes, or preservatives -pregnant or trying to get pregnant -breast-feeding How should I use this medicine? Take this medicine by mouth with a glass of water. Follow the directions on the prescription label. If you are using this medicine to prevent motion sickness, take the dose at least 1 hour before travel. If it upsets your stomach, take it with food or milk. Take your doses at regular intervals. Do not take your medicine more often than directed. Talk to your pediatrician regarding the use of this medicine in children. Special care may be needed. Overdosage: If you think you have taken too much of this medicine contact a poison control center or emergency room at once. NOTE: This medicine is only for you. Do not share this medicine with others. What if I miss a dose? If you miss a dose, take it as soon as you can. If it is almost time for your next dose, take only that dose. Do not take double or extra doses. What may interact with this  medicine? Do not take this medicine with any of the following medications: -MAOIs like Carbex, Eldepryl, Marplan, Nardil, and Parnate This medicine may also interact with the following medications: -alcohol -antihistamines for allergy, cough and cold -certain medicines for anxiety or sleep -certain medicines for depression, like amitriptyline, fluoxetine, sertraline -certain medicines for seizures like phenobarbital, primidone -general anesthetics like halothane, isoflurane, methoxyflurane, propofol -local anesthetics like lidocaine, pramoxine, tetracaine -medicines that relax muscles for surgery -narcotic medicines for pain -phenothiazines like chlorpromazine, mesoridazine, prochlorperazine, thioridazine This list may not describe all possible interactions. Give your health care provider a list of all the medicines, herbs, non-prescription drugs, or dietary supplements you use. Also tell them if you smoke, drink alcohol, or use illegal drugs. Some items may interact with your medicine. What should I watch for while using this medicine? Tell your doctor or healthcare professional if your symptoms do not start to get better or if they get worse. You may get drowsy or dizzy. Do not drive, use machinery, or do anything that needs mental alertness until you know how this medicine affects you. Do not stand or sit up quickly, especially if you are an older patient. This reduces the risk of dizzy or fainting spells. Alcohol may interfere with the effect of this medicine. Avoid alcoholic drinks. Your mouth may get dry. Chewing sugarless gum or sucking hard candy, and drinking plenty of water may help. Contact your doctor if the problem does not go away or is severe. This medicine may cause dry eyes and blurred vision. If you wear contact lenses you may feel some discomfort. Lubricating drops may  help. See your eye doctor if the problem does not go away or is severe. What side effects may I notice from  receiving this medicine? Side effects that you should report to your doctor or health care professional as soon as possible: -feeling faint or lightheaded, falls -fast, irregular heartbeat Side effects that usually do not require medical attention (report to your doctor or health care professional if they continue or are bothersome): -constipation -headache -trouble passing urine or change in the amount of urine -trouble sleeping -upset stomach This list may not describe all possible side effects. Call your doctor for medical advice about side effects. You may report side effects to FDA at 1-800-FDA-1088. Where should I keep my medicine? Keep out of the reach of children. Store at room temperature between 15 and 30 degrees C (59 and 86 degrees F). Keep container tightly closed. Throw away any unused medicine after the expiration date. NOTE: This sheet is a summary. It may not cover all possible information. If you have questions about this medicine, talk to your doctor, pharmacist, or health care provider.  2019 Elsevier/Gold Standard (2015-01-20 19:41:02)

## 2018-02-04 ENCOUNTER — Telehealth: Payer: Self-pay | Admitting: Neurology

## 2018-02-04 NOTE — Telephone Encounter (Signed)
[employer] order sent to [facility] they will obtain the auth and reach out to the pt to schedule.

## 2018-02-04 NOTE — Telephone Encounter (Signed)
Patient is aware 

## 2018-02-06 ENCOUNTER — Other Ambulatory Visit: Payer: Self-pay | Admitting: Physician Assistant

## 2018-02-06 ENCOUNTER — Telehealth: Payer: Self-pay | Admitting: *Deleted

## 2018-02-06 DIAGNOSIS — E785 Hyperlipidemia, unspecified: Secondary | ICD-10-CM

## 2018-02-06 MED ORDER — ATORVASTATIN CALCIUM 20 MG PO TABS: 20.0000 mg | ORAL_TABLET | Freq: Every day | ORAL | 1 refills | Status: DC

## 2018-02-06 NOTE — Telephone Encounter (Addendum)
We received a general letter from [facility] about a voluntary recall for a specific lot # of Lamotrigine ([id number] exp 06/2019) that may have contained a small amount of Enalapril. I called [facility] and spoke with Juliann Pulse the pharmacist. She stated they have no record any of any recent recalls on lamotrigine and cannot see specific lot # pts received. Dr. Jaynee Eagles aware. Pt was just seen in office and doing well.

## 2018-02-11 NOTE — Telephone Encounter (Signed)
Novella Rob: [id number] (exp. 02/08/18 to 05/09/18) pt is scheduled at [facility] for 02/15/18.

## 2018-02-12 ENCOUNTER — Other Ambulatory Visit: Payer: Self-pay

## 2018-02-12 MED ORDER — BUPROPION HCL ER (XL) 150 MG PO TB24: 150.0000 mg | ORAL_TABLET | Freq: Every day | ORAL | 1 refills | Status: DC

## 2018-02-12 NOTE — Telephone Encounter (Signed)
Received refill request from [facility] for 90 day supply for Bupropion XL 150mg . Verified with pt using [facility] not [facility].  Refill submitted

## 2018-02-15 ENCOUNTER — Other Ambulatory Visit: Payer: Managed Care, Other (non HMO)

## 2018-02-23 ENCOUNTER — Other Ambulatory Visit: Payer: Self-pay | Admitting: Psychiatry

## 2018-03-29 ENCOUNTER — Other Ambulatory Visit: Payer: Self-pay | Admitting: Physician Assistant

## 2018-03-29 DIAGNOSIS — E785 Hyperlipidemia, unspecified: Secondary | ICD-10-CM

## 2018-04-08 ENCOUNTER — Telehealth: Payer: Self-pay | Admitting: Neurology

## 2018-04-08 ENCOUNTER — Other Ambulatory Visit: Payer: Self-pay | Admitting: Neurology

## 2018-04-08 DIAGNOSIS — Q282 Arteriovenous malformation of cerebral vessels: Secondary | ICD-10-CM

## 2018-04-08 DIAGNOSIS — Q283 Other malformations of cerebral vessels: Secondary | ICD-10-CM

## 2018-04-08 DIAGNOSIS — D1802 Hemangioma of intracranial structures: Secondary | ICD-10-CM

## 2018-04-08 DIAGNOSIS — I613 Nontraumatic intracerebral hemorrhage in brain stem: Secondary | ICD-10-CM

## 2018-04-08 NOTE — Telephone Encounter (Signed)
[employer] order sent to [facility]. They will obtain the auth and reach out to the pt to schedule.  °

## 2018-04-09 ENCOUNTER — Other Ambulatory Visit: Payer: Self-pay

## 2018-04-09 DIAGNOSIS — F411 Generalized anxiety disorder: Secondary | ICD-10-CM

## 2018-04-09 MED ORDER — DIAZEPAM 5 MG PO TABS: 5.0000 mg | ORAL_TABLET | Freq: Every day | ORAL | 0 refills | Status: DC

## 2018-04-09 NOTE — Telephone Encounter (Signed)
Last E scription Valium 5 mg daily #90 with 1 refill on 10/16/2017 with last office appointment 12/20/2017 refilling his diazepam 01/09/2018 per [place] registry.  Appropriate for 90 day supply sent to [facility] no refill.

## 2018-05-01 ENCOUNTER — Other Ambulatory Visit: Payer: Self-pay

## 2018-05-01 ENCOUNTER — Ambulatory Visit
Admission: RE | Admit: 2018-05-01 | Discharge: 2018-05-01 | Disposition: A | Discharge status: Home / Self Care | Payer: Managed Care, Other (non HMO) | Source: Ambulatory Visit | Attending: Neurology | Admitting: Neurology

## 2018-05-01 DIAGNOSIS — R42 Dizziness and giddiness: Secondary | ICD-10-CM

## 2018-05-01 DIAGNOSIS — Q282 Arteriovenous malformation of cerebral vessels: Secondary | ICD-10-CM

## 2018-05-01 MED ORDER — GADOBENATE DIMEGLUMINE 529 MG/ML IV SOLN
20.0000 mL | Freq: Once | INTRAVENOUS | Status: AC | PRN
  Administered 2018-05-01: 20 mL via INTRAVENOUS

## 2018-05-20 ENCOUNTER — Telehealth: Payer: Self-pay | Admitting: Neurology

## 2018-05-20 NOTE — Telephone Encounter (Signed)
Due to current COVID 19 pandemic, our office is severely reducing in office visits until further notice, in order to minimize the risk to our patients and healthcare providers.   Got in touch with patient and confirmed a virtual visit for his 5/21 appointment. Patient verbalized understanding of the [email or web address] process and I have sent him an e-mail with link and directions as well as my name and office number/hours for reference. Patient understands that he will receive a call from RN to update chart.  Pt understands that although there may be some limitations with this type of visit, we will take all precautions to reduce any security or privacy concerns.  Pt understands that this will be treated like an in office visit and we will file with pt's insurance, and there may be a patient responsible charge related to this service.

## 2018-05-22 ENCOUNTER — Encounter: Payer: Self-pay | Admitting: Neurology

## 2018-05-22 NOTE — Telephone Encounter (Signed)

## 2018-05-23 ENCOUNTER — Ambulatory Visit (INDEPENDENT_AMBULATORY_CARE_PROVIDER_SITE_OTHER): Payer: Managed Care, Other (non HMO) | Admitting: Neurology

## 2018-05-23 ENCOUNTER — Encounter: Payer: Self-pay | Admitting: Neurology

## 2018-05-23 ENCOUNTER — Other Ambulatory Visit: Payer: Self-pay

## 2018-05-23 DIAGNOSIS — G4733 Obstructive sleep apnea (adult) (pediatric): Secondary | ICD-10-CM | POA: Diagnosis not present

## 2018-05-23 DIAGNOSIS — G40209 Localization-related (focal) (partial) symptomatic epilepsy and epileptic syndromes with complex partial seizures, not intractable, without status epilepticus: Secondary | ICD-10-CM | POA: Diagnosis not present

## 2018-05-23 DIAGNOSIS — D1802 Hemangioma of intracranial structures: Secondary | ICD-10-CM

## 2018-05-23 DIAGNOSIS — R200 Anesthesia of skin: Secondary | ICD-10-CM | POA: Diagnosis not present

## 2018-05-23 DIAGNOSIS — Z9989 Dependence on other enabling machines and devices: Secondary | ICD-10-CM

## 2018-05-23 MED ORDER — ZALEPLON 5 MG PO CAPS: 5.0000 mg | ORAL_CAPSULE | Freq: Every evening | ORAL | 1 refills | Status: DC | PRN
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
## 2018-05-26 ENCOUNTER — Other Ambulatory Visit: Payer: Self-pay | Admitting: Physician Assistant

## 2018-05-26 DIAGNOSIS — I10 Essential (primary) hypertension: Secondary | ICD-10-CM

## 2018-06-04 ENCOUNTER — Encounter: Payer: Self-pay | Admitting: Neurology

## 2018-06-27 ENCOUNTER — Other Ambulatory Visit: Payer: Self-pay

## 2018-06-27 ENCOUNTER — Encounter: Payer: Self-pay | Admitting: Psychiatry

## 2018-06-27 ENCOUNTER — Ambulatory Visit (INDEPENDENT_AMBULATORY_CARE_PROVIDER_SITE_OTHER): Payer: Managed Care, Other (non HMO) | Admitting: Psychiatry

## 2018-06-27 VITALS — Ht 69.0 in | Wt 218.0 lb

## 2018-06-27 DIAGNOSIS — G4733 Obstructive sleep apnea (adult) (pediatric): Secondary | ICD-10-CM | POA: Diagnosis not present

## 2018-06-27 DIAGNOSIS — F411 Generalized anxiety disorder: Secondary | ICD-10-CM

## 2018-06-27 DIAGNOSIS — F3173 Bipolar disorder, in partial remission, most recent episode manic: Secondary | ICD-10-CM

## 2018-06-27 MED ORDER — DIAZEPAM 5 MG PO TABS: 5.0000 mg | ORAL_TABLET | Freq: Every day | ORAL | 1 refills | Status: DC

## 2018-06-27 MED ORDER — BUPROPION HCL ER (XL) 150 MG PO TB24: 150.0000 mg | ORAL_TABLET | Freq: Every day | ORAL | 1 refills | Status: DC

## 2018-06-27 MED ORDER — CARIPRAZINE HCL 1.5 MG PO CAPS: 1.5000 mg | ORAL_CAPSULE | Freq: Every day | ORAL | 1 refills | Status: DC

## 2018-06-27 NOTE — Progress Notes (Signed)
[facility]  Patient ID: Todd Harris,  MRN: [id number]  PCP: Brunetta Jeans, PA-C  Date of Evaluation: 06/27/2018 Time spent:20 minutes from 1645 to [place] Complaint:  Chief Complaint    Manic Behavior; Depression; Anxiety      HISTORY/CURRENT STATUS: [place] is seen onsite in office face-to-face with consent individually with epic collateral for psychiatric interview and exam in 52-month evaluation and management of bipolar manic and generalized anxiety disorders treated by Dr. Brett Fairy for obstructive sleep apnea recently reviewed analytically with parameters adjusted still taking Sonata 5 mg if needed for insomnia.  He is soon due for neurosurgery review again after December 2018 gamma knife for pontine cavernous angioma which was smaller on last review having yearly follow up multiple other venous angiomas.  Numbness and dysmetria persist from stroke having goals of maintaining stability of corrections for hypertension, hyperlipidemia, bipolar disorder, seizure disorder, and insomnia to minimize risk of bleeding in any way possible.  He wakes every morning at 0430 with anxiety taking  meclizine for dizziness and his lisinopril/HCTZ hypertension as Dr. Jaynee Eagles treats seizure disorder with Lamictal. Psychiatric medications are unchanged.  Lipitor was apparently stopped in February by Hassell Done but restarted in late March. 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  He suggests that his finances are vulnerable now at least partly from manic overspending in the past.  He continues to have depressive episodes are modest but  manic episodes are prevented.  He is not suicidal, delirious, psychotic, or substance using other than 3 beers weekly.  Depression         This is a recurrent problem, currently in partial remission last  episode starting more than 1 year ago.   The onset quality is sudden.   The problem occurs intermittently.  The problem has been gradually improving since onset.  Associated symptoms include decreased concentration, fatigue, headaches and sad.  Associated symptoms include no hopelessness, does not have insomnia, no decreased interest and no current suicidal ideas though prior attempt     The symptoms are aggravated by work stress, medication and social issues.  Past treatments include other medications.  Compliance with treatment is good.  Past compliance problems include difficulty with treatment plan, medication issues, medical issues and insurance issues.  Previous treatment provided moderate relief.  Past medical history includes anxiety.    Individual Medical History/ Review of Systems: Changes? :Yes Ongoing active primary, neurological, neurosurgical, and sleep disorder care.  Weight is down pounds from last appointment.  Allergies: Patient has no known allergies.  Current Medications:  Current Outpatient Medications:  .  atorvastatin (LIPITOR) 20 MG tablet, TAKE 1 TABLET BY MOUTH EVERY DAY, Disp: 90 tablet, Rfl: 1 .  buPROPion (WELLBUTRIN XL) 150 MG 24 hr tablet, Take 1 tablet (150 mg total) by mouth daily., Disp: 90 tablet, Rfl: 1 .  cariprazine (VRAYLAR) capsule, Take 1 capsule (1.5 mg total) by mouth daily., Disp: 90 capsule, Rfl: 1 .  diazepam (VALIUM) 5 MG tablet, Take 1 tablet (5 mg total) by mouth daily., Disp: 90 tablet, Rfl: 1 .  lamoTRIgine (LAMICTAL) 100 MG tablet, Take 1 tablet (100 mg total) by mouth 2 (two) times daily., Disp: 180 tablet, Rfl: 6 .  lisinopril-hydrochlorothiazide (ZESTORETIC) 20-25 MG tablet, TAKE 1 TABLET BY MOUTH EVERY DAY, Disp: 90 tablet, Rfl: 1 .  meclizine (MEDI-MECLIZINE) 25 MG tablet, Take 1 tablet (25 mg total) by mouth 3 (three) times daily as needed for dizziness., Disp: 60 tablet, Rfl: 6 .  zaleplon (SONATA) 5 MG capsule, Take 1 capsule (5 mg total)  by mouth at bedtime as needed. for sleep, Disp: 30 capsule, Rfl: 1   Medication Side Effects: none  Family Medical/ Social History: Changes? Yes other took lithium for bipolar and maternal grandmother as well as brother may have bipolar.  MENTAL HEALTH EXAM:  Height 5\' 9"  (1.753 m), weight 218 lb (98.9 kg).Body mass index is 32.19 kg/m.  Others deferred as nonessential in coronavirus pandemic.  General Appearance: Casual, Fairly Groomed, Guarded and Obese  Eye Contact:  Good  Speech:  Clear and Coherent, Normal Rate and Talkative  Volume:  Normal  Mood:  Anxious, Dysphoric, Euphoric, Euthymic and Hopeless episodically  Affect:  Inappropriate, Full Range and Anxious  Thought Process:  Coherent, Goal Directed and Linear  Orientation:  Full (Time, Place, and Person)  Thought Content: Obsessions and Rumination   Suicidal Thoughts:  No  Homicidal Thoughts:  No  Memory:  Immediate;   Good Remote;   Fair to good  Judgement:  Good  Insight:  Good  Psychomotor Activity:  Normal, Decreased and Mannerisms  Concentration:  Concentration: Fair and Attention Span: Good  Recall:  Good  Fund of Knowledge: Good  Language: Good  Assets:  Desire for Improvement Intimacy Resilience Talents/Skills Vocational/Educational  ADL's:  Intact  Cognition: WNL  Prognosis:  Fair    DIAGNOSES:    ICD-10-CM   1. Bipolar I disorder, current or most recent episode manic, in partial remission with anxious distress (HCC)  F31.73 buPROPion (WELLBUTRIN XL) 150 MG 24 hr tablet    diazepam (VALIUM) 5 MG tablet    cariprazine (VRAYLAR) capsule  2. GAD (generalized anxiety disorder)  F41.1 buPROPion (WELLBUTRIN XL) 150 MG 24 hr tablet    diazepam (VALIUM) 5 MG tablet  3. Obstructive sleep apnea  G47.33     Receiving Psychotherapy: No    RECOMMENDATIONS: Patient arrives 18 minutes early protesting that psychiatrist is delayed only 5 minutes.  His cognition and behavioral regulation are good, but he gets  particularly tired in the end of the day after putting forth maximal effort through the workday to maintain his balance and sustain effectiveness.  He reviews all of these functions for integration as possible.  He understands and agrees to continue medications matched for psychiatric diagnoses with symptoms. He is E scribed to continue Vraylar 1.5 mg every morning sent as a 90-day supply and 1 refill to [facility] for bipolar disorder. Wellbutrin 150 mg XL every morning is E scribed to [facility] as #90 with 1 refill for bipolar depression and anxiety. He takes Valium 5 mg every morning #90 with 1 refill sent to [facility]. Follow up is 6 months or sooner as needed.   Delight Hoh, MD

## 2018-07-23 ENCOUNTER — Encounter (HOSPITAL_COMMUNITY): Payer: Self-pay

## 2018-07-23 ENCOUNTER — Ambulatory Visit (INDEPENDENT_AMBULATORY_CARE_PROVIDER_SITE_OTHER): Payer: Managed Care, Other (non HMO) | Admitting: Physician Assistant

## 2018-07-23 ENCOUNTER — Encounter: Payer: Self-pay | Admitting: Physician Assistant

## 2018-07-23 ENCOUNTER — Emergency Department (HOSPITAL_COMMUNITY): Payer: Managed Care, Other (non HMO)

## 2018-07-23 ENCOUNTER — Inpatient Hospital Stay (HOSPITAL_COMMUNITY)
Admission: EM | Admit: 2018-07-23 | Discharge: 2018-07-25 | DRG: 354 | Disposition: A | Discharge status: Home / Self Care | Payer: Managed Care, Other (non HMO) | Attending: Internal Medicine | Admitting: Internal Medicine

## 2018-07-23 ENCOUNTER — Telehealth: Payer: Self-pay | Admitting: Physician Assistant

## 2018-07-23 ENCOUNTER — Other Ambulatory Visit: Payer: Self-pay

## 2018-07-23 VITALS — BP 118/86 | HR 81 | Temp 98.6°F | Resp 16 | Ht 69.0 in | Wt 221.0 lb

## 2018-07-23 DIAGNOSIS — Z825 Family history of asthma and other chronic lower respiratory diseases: Secondary | ICD-10-CM

## 2018-07-23 DIAGNOSIS — K429 Umbilical hernia without obstruction or gangrene: Secondary | ICD-10-CM

## 2018-07-23 DIAGNOSIS — K2270 Barrett's esophagus without dysplasia: Secondary | ICD-10-CM | POA: Diagnosis present

## 2018-07-23 DIAGNOSIS — R569 Unspecified convulsions: Secondary | ICD-10-CM

## 2018-07-23 DIAGNOSIS — K420 Umbilical hernia with obstruction, without gangrene: Principal | ICD-10-CM

## 2018-07-23 DIAGNOSIS — G40909 Epilepsy, unspecified, not intractable, without status epilepticus: Secondary | ICD-10-CM | POA: Diagnosis present

## 2018-07-23 DIAGNOSIS — G4733 Obstructive sleep apnea (adult) (pediatric): Secondary | ICD-10-CM

## 2018-07-23 DIAGNOSIS — I482 Chronic atrial fibrillation, unspecified: Secondary | ICD-10-CM | POA: Diagnosis present

## 2018-07-23 DIAGNOSIS — Z79899 Other long term (current) drug therapy: Secondary | ICD-10-CM

## 2018-07-23 DIAGNOSIS — F411 Generalized anxiety disorder: Secondary | ICD-10-CM | POA: Diagnosis present

## 2018-07-23 DIAGNOSIS — E785 Hyperlipidemia, unspecified: Secondary | ICD-10-CM | POA: Diagnosis present

## 2018-07-23 DIAGNOSIS — E860 Dehydration: Secondary | ICD-10-CM | POA: Diagnosis present

## 2018-07-23 DIAGNOSIS — I959 Hypotension, unspecified: Secondary | ICD-10-CM | POA: Diagnosis present

## 2018-07-23 DIAGNOSIS — Z1159 Encounter for screening for other viral diseases: Secondary | ICD-10-CM

## 2018-07-23 DIAGNOSIS — I10 Essential (primary) hypertension: Secondary | ICD-10-CM | POA: Diagnosis present

## 2018-07-23 DIAGNOSIS — M549 Dorsalgia, unspecified: Secondary | ICD-10-CM | POA: Diagnosis present

## 2018-07-23 DIAGNOSIS — Z8249 Family history of ischemic heart disease and other diseases of the circulatory system: Secondary | ICD-10-CM

## 2018-07-23 DIAGNOSIS — I152 Hypertension secondary to endocrine disorders: Secondary | ICD-10-CM | POA: Diagnosis present

## 2018-07-23 DIAGNOSIS — Z833 Family history of diabetes mellitus: Secondary | ICD-10-CM

## 2018-07-23 DIAGNOSIS — Z800 Family history of malignant neoplasm of digestive organs: Secondary | ICD-10-CM

## 2018-07-23 DIAGNOSIS — K460 Unspecified abdominal hernia with obstruction, without gangrene: Secondary | ICD-10-CM | POA: Diagnosis present

## 2018-07-23 DIAGNOSIS — G8929 Other chronic pain: Secondary | ICD-10-CM | POA: Diagnosis present

## 2018-07-23 DIAGNOSIS — Z8673 Personal history of transient ischemic attack (TIA), and cerebral infarction without residual deficits: Secondary | ICD-10-CM

## 2018-07-23 LAB — SARS CORONAVIRUS 2 BY RT PCR (HOSPITAL ORDER, PERFORMED IN ~~LOC~~ HOSPITAL LAB): SARS Coronavirus 2: NEGATIVE

## 2018-07-23 LAB — URINALYSIS, ROUTINE W REFLEX MICROSCOPIC
Bilirubin Urine: NEGATIVE
Glucose, UA: NEGATIVE mg/dL
Hgb urine dipstick: NEGATIVE
Ketones, ur: NEGATIVE mg/dL
Leukocytes,Ua: NEGATIVE
Nitrite: NEGATIVE
Protein, ur: NEGATIVE mg/dL
Specific Gravity, Urine: 1.024 (ref 1.005–1.030)
pH: 6 (ref 5.0–8.0)

## 2018-07-23 LAB — COMPREHENSIVE METABOLIC PANEL
ALT: 33 U/L (ref 0–44)
AST: 17 U/L (ref 15–41)
Albumin: 4.6 g/dL (ref 3.5–5.0)
Alkaline Phosphatase: 66 U/L (ref 38–126)
Anion gap: 12 (ref 5–15)
BUN: 27 mg/dL — ABNORMAL HIGH (ref 6–20)
CO2: 26 mmol/L (ref 22–32)
Calcium: 9.5 mg/dL (ref 8.9–10.3)
Chloride: 103 mmol/L (ref 98–111)
Creatinine, Ser: 1.15 mg/dL (ref 0.61–1.24)
GFR calc Af Amer: >60 mL/min (ref >60)
GFR calc non Af Amer: >60 mL/min (ref >60)
Glucose, Bld: 94 mg/dL (ref 70–99)
Potassium: 4 mmol/L (ref 3.5–5.1)
Sodium: 141 mmol/L (ref 135–145)
Total Bilirubin: 0.6 mg/dL (ref 0.3–1.2)
Total Protein: 7.3 g/dL (ref 6.5–8.1)

## 2018-07-23 LAB — CBC WITH DIFFERENTIAL/PLATELET
Abs Immature Granulocytes: 0.09 10*3/uL — ABNORMAL HIGH (ref 0.00–0.07)
Basophils Absolute: 0.1 10*3/uL (ref 0.0–0.1)
Basophils Relative: 1 %
Eosinophils Absolute: 0.5 10*3/uL (ref 0.0–0.5)
Eosinophils Relative: 4 %
HCT: 46.4 % (ref 39.0–52.0)
Hemoglobin: 15.3 g/dL (ref 13.0–17.0)
Immature Granulocytes: 1 %
Lymphocytes Relative: 16 %
Lymphs Abs: 2 10*3/uL (ref 0.7–4.0)
MCH: 30.2 pg (ref 26.0–34.0)
MCHC: 33 g/dL (ref 30.0–36.0)
MCV: 91.5 fL (ref 80.0–100.0)
Monocytes Absolute: 1.1 10*3/uL — ABNORMAL HIGH (ref 0.1–1.0)
Monocytes Relative: 9 %
Neutro Abs: 8.8 10*3/uL — ABNORMAL HIGH (ref 1.7–7.7)
Neutrophils Relative %: 69 %
Platelets: 189 10*3/uL (ref 150–400)
RBC: 5.07 MIL/uL (ref 4.22–5.81)
RDW: 13.2 % (ref 11.5–15.5)
WBC: 12.4 10*3/uL — ABNORMAL HIGH (ref 4.0–10.5)
nRBC: 0 % (ref 0.0–0.2)

## 2018-07-23 LAB — I-STAT CHEM 8, ED
BUN: 27 mg/dL — ABNORMAL HIGH (ref 6–20)
Calcium, Ion: 1.14 mmol/L — ABNORMAL LOW (ref 1.15–1.40)
Chloride: 104 mmol/L (ref 98–111)
Creatinine, Ser: 1.2 mg/dL (ref 0.61–1.24)
Glucose, Bld: 90 mg/dL (ref 70–99)
HCT: 45 % (ref 39.0–52.0)
Hemoglobin: 15.3 g/dL (ref 13.0–17.0)
Potassium: 4 mmol/L (ref 3.5–5.1)
Sodium: 140 mmol/L (ref 135–145)
TCO2: 26 mmol/L (ref 22–32)

## 2018-07-23 LAB — LACTIC ACID, PLASMA: Lactic Acid, Venous: 0.8 mmol/L (ref 0.5–1.9)

## 2018-07-23 LAB — LIPASE, BLOOD: Lipase: 28 U/L (ref 11–51)

## 2018-07-23 MED ORDER — MORPHINE SULFATE (PF) 4 MG/ML IV SOLN
4.0000 mg | Freq: Once | INTRAVENOUS | Status: AC
  Administered 2018-07-23: 4 mg via INTRAVENOUS
  Filled 2018-07-23: qty 1

## 2018-07-23 MED ORDER — MORPHINE SULFATE (PF) 4 MG/ML IV SOLN: 4.0000 mg | INTRAVENOUS | Status: DC | PRN

## 2018-07-23 MED ORDER — SODIUM CHLORIDE (PF) 0.9 % IJ SOLN
INTRAMUSCULAR | Status: AC
  Administered 2018-07-23: 20:00:00
  Filled 2018-07-23: qty 50

## 2018-07-23 MED ORDER — ATORVASTATIN CALCIUM 20 MG PO TABS
20.0000 mg | ORAL_TABLET | Freq: Every day | ORAL | Status: DC
  Administered 2018-07-24: 20 mg via ORAL
  Filled 2018-07-23: qty 1

## 2018-07-23 MED ORDER — LISINOPRIL-HYDROCHLOROTHIAZIDE 20-25 MG PO TABS: 1.0000 | ORAL_TABLET | Freq: Every day | ORAL | Status: DC

## 2018-07-23 MED ORDER — LAMOTRIGINE 100 MG PO TABS
100.0000 mg | ORAL_TABLET | Freq: Two times a day (BID) | ORAL | Status: DC
  Administered 2018-07-24 – 2018-07-25 (×4): 100 mg via ORAL
  Filled 2018-07-23 (×4): qty 1

## 2018-07-23 MED ORDER — MECLIZINE HCL 25 MG PO TABS: 25.0000 mg | ORAL_TABLET | Freq: Three times a day (TID) | ORAL | Status: DC | PRN

## 2018-07-23 MED ORDER — ZOLPIDEM TARTRATE 5 MG PO TABS: 5.0000 mg | ORAL_TABLET | Freq: Every evening | ORAL | Status: DC | PRN

## 2018-07-23 MED ORDER — BUPROPION HCL ER (XL) 150 MG PO TB24
150.0000 mg | ORAL_TABLET | Freq: Every day | ORAL | Status: DC
  Administered 2018-07-24 – 2018-07-25 (×2): 150 mg via ORAL
  Filled 2018-07-23 (×2): qty 1

## 2018-07-23 MED ORDER — ACETAMINOPHEN 325 MG PO TABS: 650.0000 mg | ORAL_TABLET | Freq: Four times a day (QID) | ORAL | Status: DC | PRN

## 2018-07-23 MED ORDER — SODIUM CHLORIDE 0.9 % IV SOLN
INTRAVENOUS | Status: DC
  Administered 2018-07-24 (×2): via INTRAVENOUS

## 2018-07-23 MED ORDER — KETOROLAC TROMETHAMINE 30 MG/ML IJ SOLN
30.0000 mg | Freq: Once | INTRAMUSCULAR | Status: AC
  Administered 2018-07-24: 30 mg via INTRAVENOUS
  Filled 2018-07-23: qty 1

## 2018-07-23 MED ORDER — PROCHLORPERAZINE EDISYLATE 10 MG/2ML IJ SOLN: 10.0000 mg | Freq: Four times a day (QID) | INTRAMUSCULAR | Status: DC | PRN

## 2018-07-23 MED ORDER — MORPHINE SULFATE (PF) 4 MG/ML IV SOLN
4.0000 mg | Freq: Once | INTRAVENOUS | Status: AC
  Administered 2018-07-24: 4 mg via INTRAVENOUS
  Filled 2018-07-23: qty 1

## 2018-07-23 MED ORDER — ENOXAPARIN SODIUM 40 MG/0.4ML ~~LOC~~ SOLN
40.0000 mg | Freq: Every day | SUBCUTANEOUS | Status: DC
  Filled 2018-07-23: qty 0.4

## 2018-07-23 MED ORDER — CARIPRAZINE HCL 1.5 MG PO CAPS
1.5000 mg | ORAL_CAPSULE | Freq: Every day | ORAL | Status: DC
  Administered 2018-07-24 – 2018-07-25 (×2): 1.5 mg via ORAL
  Filled 2018-07-23 (×2): qty 1

## 2018-07-23 MED ORDER — ACETAMINOPHEN 650 MG RE SUPP: 650.0000 mg | Freq: Four times a day (QID) | RECTAL | Status: DC | PRN

## 2018-07-23 MED ORDER — SODIUM CHLORIDE 0.9 % IV BOLUS
500.0000 mL | Freq: Once | INTRAVENOUS | Status: AC
  Administered 2018-07-23: 18:00:00 500 mL via INTRAVENOUS

## 2018-07-23 MED ORDER — DIAZEPAM 5 MG PO TABS
5.0000 mg | ORAL_TABLET | Freq: Every day | ORAL | Status: DC
  Administered 2018-07-24 – 2018-07-25 (×2): 5 mg via ORAL
  Filled 2018-07-23 (×2): qty 1

## 2018-07-23 MED ORDER — IOHEXOL 300 MG/ML  SOLN
100.0000 mL | Freq: Once | INTRAMUSCULAR | Status: AC | PRN
  Administered 2018-07-23: 100 mL via INTRAVENOUS

## 2018-07-23 NOTE — ED Provider Notes (Signed)
[facility] DEPT Provider Note   CSN: [id number] Arrival date & time: 07/23/18  1633    History   Chief Complaint Chief Complaint  Patient presents with   Hernia    HPI  Todd Harris is a 58 y.o. male with history of hypertension, CVA, bipolar 1, seizures, appendectomy presents today from primary care providers office for concern of strangulated umbilical hernia.  Patient reports that over the past 2 days he has had increasing pain and erythema to his periumbilical area.  He reports that he has had an umbilical hernia for multiple years that has never caused him any pain or concern.  Patient denies any known inciting events states pain came on randomly he describes it as an 8/10 in severity throbbing burning pain constant worsened with palpation and without alleviating factors.    Denies fever/chills, nausea/vomiting, diarrhea/change in bowel movements, blood in the stool, cough/shortness of breath, chest pain or any additional concerns today.  Last p.o. intake was 1:30 PM this afternoon.  Last bowel movement today normal per patient.    HPI  Past Medical History:  Diagnosis Date   Anxiety    Atrial arrhythmia    Barrett esophagus    Bipolar 1 disorder ([place])    Cavernous angioma    Depression    History of chicken pox    Hypertension    Seizures ([place])    1 in 2005, 1 in 2016    Sleep apnea    Sleep apnea    Stroke ([place]) 11/2014   No residual deficits   Venous malformation    Brain    Patient Active Problem List   Diagnosis Date Noted   Incarcerated umbilical hernia 54/27/0623   Vertigo of central origin 02/01/2018   GAD (generalized anxiety disorder) 12/06/2017   Bipolar I disorder, current or most recent episode manic, in partial remission with anxious distress ([place]) 12/06/2017   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[place]) 05/17/2017   Insomnia due to medical condition 05/17/2017   OSA on CPAP 05/17/2017   Cerebral cavernous  malformation 09/28/2016   Seizures ([place]) 09/28/2016   Keratoma 09/28/2016   ICH (intracerebral hemorrhage) ([place]) 09/27/2016   Hemangioma of skin and subcutaneous tissue 07/07/2016   Visit for preventive health examination 07/07/2016   Prostate cancer screening 07/07/2016   Obstructive sleep apnea 12/23/2015   Arteriovenous malformation of brain 12/06/2015   Cavernous hemangioma of brain ([place]) 12/30/2014   HTN (hypertension) 12/30/2014    Past Surgical History:  Procedure Laterality Date   APPENDECTOMY  1970   age 18   bone spur     age 39   kidney stones     RAD ONC MR BRAIN GAMMA KNIFE  12/05/2016   TONSILLECTOMY  1968        Home Medications    Prior to Admission medications   Medication Sig Start Date End Date Taking? Authorizing Provider  atorvastatin (LIPITOR) 20 MG tablet TAKE 1 TABLET BY MOUTH EVERY DAY Patient taking differently: Take 20 mg by mouth daily.  03/29/18  Yes Brunetta Jeans, PA-C  buPROPion (WELLBUTRIN XL) 150 MG 24 hr tablet Take 1 tablet (150 mg total) by mouth daily. 06/27/18  Yes Delight Hoh, MD  cariprazine (VRAYLAR) capsule Take 1 capsule (1.5 mg total) by mouth daily. 06/27/18  Yes Delight Hoh, MD  diazepam (VALIUM) 5 MG tablet Take 1 tablet (5 mg total) by mouth daily. 06/27/18  Yes Delight Hoh, MD  lamoTRIgine (LAMICTAL) 100 MG  tablet Take 1 tablet (100 mg total) by mouth 2 (two) times daily. 02/01/18  Yes Melvenia Beam, MD  lisinopril-hydrochlorothiazide (ZESTORETIC) 20-25 MG tablet TAKE 1 TABLET BY MOUTH EVERY DAY Patient taking differently: Take 1 tablet by mouth daily.  05/28/18  Yes Brunetta Jeans, PA-C  meclizine (MEDI-MECLIZINE) 25 MG tablet Take 1 tablet (25 mg total) by mouth 3 (three) times daily as needed for dizziness. 02/01/18  Yes Melvenia Beam, MD  zaleplon (SONATA) 5 MG capsule Take 1 capsule (5 mg total) by mouth at bedtime as needed. for sleep 05/23/18  Yes Dohmeier, Asencion Partridge, MD    Family  History Family History  Problem Relation Age of Onset   Aneurysm Maternal Grandmother    Asthma Mother    Diabetes Mother    Hypertension Mother    Diabetes Brother    Diabetes Brother    Pancreatic cancer Father    Diabetes Father    Cancer Father        Pancreatic   Diabetes Paternal Grandmother    Diabetes Paternal Grandfather    Colon cancer Neg Hx     Social History Social History   Tobacco Use   Smoking status: Never Smoker   Smokeless tobacco: Never Used  Substance Use Topics   Alcohol use: Yes    Alcohol/week: 4.0 standard drinks    Types: 4 Standard drinks or equivalent per week    Frequency: Never    Comment: was 4-5 beers/week   Drug use: No     Allergies   Patient has no known allergies.   Review of Systems Review of Systems Ten systems are reviewed and are negative for acute change except as noted in the HPI  Physical Exam Updated Vital Signs BP 117/79    Pulse 78    Temp 98.9 F (37.2 C) (Oral)    Resp 16    Ht 5\' 10"  (1.778 m)    Wt 97.5 kg    SpO2 98%    BMI 30.85 kg/m   Physical Exam Constitutional:      General: He is not in acute distress.    Appearance: Normal appearance. He is well-developed. He is not ill-appearing or diaphoretic.  HENT:     Head: Normocephalic and atraumatic.     Right Ear: External ear normal.     Left Ear: External ear normal.     Nose: Nose normal.  Eyes:     General: Vision grossly intact. Gaze aligned appropriately.     Pupils: Pupils are equal, round, and reactive to light.  Neck:     Musculoskeletal: Normal range of motion.     Trachea: Trachea and phonation normal. No tracheal deviation.  Cardiovascular:     Rate and Rhythm: Normal rate and regular rhythm.     Pulses: Normal pulses.          Dorsalis pedis pulses are 2+ on the right side and 2+ on the left side.  Pulmonary:     Effort: Pulmonary effort is normal. No respiratory distress.  Abdominal:     Palpations: Abdomen is soft.  There is no pulsatile mass.     Tenderness: There is abdominal tenderness in the periumbilical area. There is no guarding or rebound.     Hernia: A hernia is present. Hernia is present in the umbilical area.     Comments: Nonreducible umbilical hernia with approximately 8 cm in diameter surrounding erythema.  Musculoskeletal: Normal range of motion.  Feet:  Right foot:     Protective Sensation: 3 sites tested. 3 sites sensed.     Left foot:     Protective Sensation: 3 sites tested. 3 sites sensed.  Skin:    General: Skin is warm and dry.  Neurological:     Mental Status: He is alert.     GCS: GCS eye subscore is 4. GCS verbal subscore is 5. GCS motor subscore is 6.     Comments: Speech is clear and goal oriented, follows commands Major Cranial nerves without deficit, no facial droop Moves extremities without ataxia, coordination intact  Psychiatric:        Behavior: Behavior normal.    ED Treatments / Results  Labs (all labs ordered are listed, but only abnormal results are displayed) Labs Reviewed  CBC WITH DIFFERENTIAL/PLATELET - Abnormal; Notable for the following components:      Result Value   WBC 12.4 (*)    Neutro Abs 8.8 (*)    Monocytes Absolute 1.1 (*)    Abs Immature Granulocytes 0.09 (*)    All other components within normal limits  COMPREHENSIVE METABOLIC PANEL - Abnormal; Notable for the following components:   BUN 27 (*)    All other components within normal limits  I-STAT CHEM 8, ED - Abnormal; Notable for the following components:   BUN 27 (*)    Calcium, Ion 1.14 (*)    All other components within normal limits  SARS CORONAVIRUS 2 (HOSPITAL ORDER, [facility])  LIPASE, BLOOD  URINALYSIS, ROUTINE W REFLEX MICROSCOPIC  LACTIC ACID, PLASMA  HIV ANTIBODY (ROUTINE TESTING W REFLEX)  CBC WITH DIFFERENTIAL/PLATELET  COMPREHENSIVE METABOLIC PANEL  PROTIME-INR  APTT  CBC    EKG None  Radiology Ct Abdomen Pelvis W  Contrast  Result Date: 07/23/2018 CLINICAL DATA:  58 year old male with possible strangulated umbilical hernia. EXAM: CT ABDOMEN AND PELVIS WITH CONTRAST TECHNIQUE: Multidetector CT imaging of the abdomen and pelvis was performed using the standard protocol following bolus administration of intravenous contrast. CONTRAST:  169mL OMNIPAQUE IOHEXOL 300 MG/ML  SOLN COMPARISON:  CT of the abdomen pelvis dated 10/10/2013 FINDINGS: Lower chest: Minimal bibasilar dependent atelectatic changes. The visualized lung bases are otherwise clear. No intra-abdominal free air or free fluid. Hepatobiliary: No focal liver abnormality is seen. No gallstones, gallbladder wall thickening, or biliary dilatation. Pancreas: Unremarkable. No pancreatic ductal dilatation or surrounding inflammatory changes. Spleen: Normal in size without focal abnormality. Adrenals/Urinary Tract: Adrenal glands are unremarkable. Kidneys are normal, without renal calculi, focal lesion, or hydronephrosis. Bladder is unremarkable. Stomach/Bowel: There are scattered colonic diverticula without active inflammatory changes. There is no bowel obstruction or active inflammation. Appendectomy. Vascular/Lymphatic: Mild aortoiliac atherosclerotic disease. No portal venous gas. There is no adenopathy. Reproductive: The prostate and seminal vesicles are grossly unremarkable. No pelvic mass. Other: There is a small fat containing umbilical hernia. The neck of the hernia measures 2.5 cm in diameter. There is inflammatory changes of the herniated fat and surrounding subcutaneous soft tissues suggestive of strangulation or incarceration. Clinical correlation is recommended. No drainable fluid collection identified. There is no herniation of bowel. Musculoskeletal: No acute or significant osseous findings. IMPRESSION: 1. Small fat containing umbilical hernia with inflammatory changes suggestive of strangulation or incarceration. Clinical correlation is recommended. No fluid  collection. 2. Colonic diverticulosis. 3. Five aortic Atherosclerosis (ICD10-I70.0). Electronically Signed   By: Anner Crete M.D.   On: 07/23/2018 21:13    Procedures Procedures (including critical care time)  Medications Ordered in  ED Medications  sodium chloride (PF) 0.9 % injection (has no administration in time range)  enoxaparin (LOVENOX) injection 40 mg (has no administration in time range)  0.9 %  sodium chloride infusion (has no administration in time range)  acetaminophen (TYLENOL) tablet 650 mg (has no administration in time range)    Or  acetaminophen (TYLENOL) suppository 650 mg (has no administration in time range)  prochlorperazine (COMPAZINE) injection 10 mg (has no administration in time range)  morphine 4 MG/ML injection 4 mg (4 mg Intravenous Given 07/23/18 1816)  sodium chloride 0.9 % bolus 500 mL (0 mLs Intravenous Stopped 07/23/18 1854)  iohexol (OMNIPAQUE) 300 MG/ML solution 100 mL (100 mLs Intravenous Contrast Given 07/23/18 2014)  morphine 4 MG/ML injection 4 mg (4 mg Intravenous Given 07/23/18 2125)     Initial Impression / Assessment and Plan / ED Course  I have reviewed the triage vital signs and the nursing notes.  Pertinent labs & imaging results that were available during my care of the patient were reviewed by me and considered in my medical decision making (see chart for details).  Clinical Course as of Jul 23 2203  Tue Jul 23, 2018  2119 Dr. Ninfa Linden   [BM]  2129 Dr. Olevia Bowens   [BM]    Clinical Course User Index [BM] Nuala Alpha A, PA-C   Lactic 0.8 Lipase 28 I-STAT nonacute CMP nonacute CBC with leukocytosis of 12.4 with left shift Urinalysis negative COVID-19 negative CT Abd/Pelvis:  IMPRESSION:  1. Small fat containing umbilical hernia with inflammatory changes  suggestive of strangulation or incarceration. Clinical correlation  is recommended. No fluid collection.  2. Colonic diverticulosis.  3. Five aortic Atherosclerosis  (ICD10-I70.0).  - Fluid bolus given, pain control given.  Patient reassessed resting comfortably and in no acute distress, updated on results today states understanding.  He is agreeable for admission at this time. - Discussed case with general surgery Dr. Ninfa Linden who advises hospital admission, surgery team to see patient in the morning. - Discussed case with Dr. Olevia Bowens who is seeing patient for admission. - Patient has been admitted to hospitalist service for further evaluation and management.  Note: Portions of this report may have been transcribed using voice recognition software. Every effort was made to ensure accuracy; however, inadvertent computerized transcription errors may still be present. Final Clinical Impressions(s) / ED Diagnoses   Final diagnoses:  Umbilical hernia without obstruction and without gangrene    ED Discharge Orders    None       Gari Crown 07/23/18 2206    Deno Etienne, DO 07/23/18 2219

## 2018-07-23 NOTE — Telephone Encounter (Signed)
Attempted to contact patient regarding symptoms. No answer, voicemail full.

## 2018-07-23 NOTE — Telephone Encounter (Signed)
Second attempt to contact patient

## 2018-07-23 NOTE — Telephone Encounter (Signed)
Patient scheduled for 3:30 for symptoms concerning for incarcerated/strangulated hernia and potential infection.  If he truly has significant redness of hernia with pain and hardness or inability to reduce hernia without pain, he needs to be seen in the ER.

## 2018-07-23 NOTE — H&P (Signed)
History and Physical    Todd Harris:[id number] DOB: 09-06-1960 DOA: 07/23/2018  PCP: Brunetta Jeans, PA-C   Patient coming from: Home.  I have personally briefly reviewed patient's old medical records in [place]  Chief Complaint: Abdominal pain  HPI: Todd Harris is a 58 y.o. male with medical history significant of anxiety, atrial arrhythmia, Barrett's esophagus, bipolar 1 disorder, cavernous angioma, history of varicella-zoster, hypertension, history of pontine stroke, seizures, sleep apnea on CPAP who is coming to the emergency department with complaints of abdominal pain in his umbilical and periumbilical area, where he has had an umbilical hernia for the past 10 years, the patient states that normally he is able to push the tissue back and then he wears support for a day or 2 until the symptoms resolve.  However, this episode has been the most painful ever.  He denies nausea or vomiting, fever, constipation, diarrhea, melena or hematochezia.  No dysuria, frequency or hematuria.  He denies chest pain, dyspnea, palpitations, diaphoresis, PND, orthopnea, but occasionally gets lower extremity edema.  ED Course: Initial vital signs were Temperature 98.9 F, pulse 79, respirations 17, blood pressure 132/88 mmHg and O2 sat 98% on room air.  The patient received IV fluids and morphine in the emergency department.  Dr. Ninfa Linden from general surgery was contacted and will be evaluating the patient in the morning.  Urinalysis was normal.  CBC showed a white count of 12.4, with 69% neutrophils.  Hemoglobin 15.3 g/dL and platelets 289.  CMP shows a BUN of 27 mg/dL, all other values are within normal limits.  Lactic acid was 0.8.  SARS coronavirus 2 swab was negative.  Imaging: CT abdomen pelvis with contrast showed small fat-containing umbilical hernia with inflammatory changes suggestive with strangulation or consideration.  Review of Systems: As per HPI otherwise 10 point review of  systems negative.   Past Medical History:  Diagnosis Date   Anxiety    Atrial arrhythmia    Barrett esophagus    Bipolar 1 disorder ([place])    Cavernous angioma    Depression    History of chicken pox    Hypertension    Seizures ([place])    1 in 2005, 1 in 2016    Sleep apnea    Sleep apnea    Stroke ([place]) 11/2014   No residual deficits   Venous malformation    Brain    Past Surgical History:  Procedure Laterality Date   APPENDECTOMY  1970   age 33   bone spur     age 33   kidney stones     RAD ONC MR BRAIN GAMMA KNIFE  12/05/2016   TONSILLECTOMY  1968     reports that he has never smoked. He has never used smokeless tobacco. He reports current alcohol use of about 4.0 standard drinks of alcohol per week. He reports that he does not use drugs.  No Known Allergies  Family History  Problem Relation Age of Onset   Aneurysm Maternal Grandmother    Asthma Mother    Diabetes Mother    Hypertension Mother    Diabetes Brother    Diabetes Brother    Pancreatic cancer Father    Diabetes Father    Cancer Father        Pancreatic   Diabetes Paternal Grandmother    Diabetes Paternal Grandfather    Colon cancer Neg Hx    Prior to Admission medications   Medication Sig Start Date End  Date Taking? Authorizing Provider  atorvastatin (LIPITOR) 20 MG tablet TAKE 1 TABLET BY MOUTH EVERY DAY Patient taking differently: Take 20 mg by mouth daily.  03/29/18  Yes Brunetta Jeans, PA-C  buPROPion (WELLBUTRIN XL) 150 MG 24 hr tablet Take 1 tablet (150 mg total) by mouth daily. 06/27/18  Yes Delight Hoh, MD  cariprazine (VRAYLAR) capsule Take 1 capsule (1.5 mg total) by mouth daily. 06/27/18  Yes Delight Hoh, MD  diazepam (VALIUM) 5 MG tablet Take 1 tablet (5 mg total) by mouth daily. 06/27/18  Yes Delight Hoh, MD  lamoTRIgine (LAMICTAL) 100 MG tablet Take 1 tablet (100 mg total) by mouth 2 (two) times daily. 02/01/18  Yes Melvenia Beam, MD    lisinopril-hydrochlorothiazide (ZESTORETIC) 20-25 MG tablet TAKE 1 TABLET BY MOUTH EVERY DAY Patient taking differently: Take 1 tablet by mouth daily.  05/28/18  Yes Brunetta Jeans, PA-C  meclizine (MEDI-MECLIZINE) 25 MG tablet Take 1 tablet (25 mg total) by mouth 3 (three) times daily as needed for dizziness. 02/01/18  Yes Melvenia Beam, MD  zaleplon (SONATA) 5 MG capsule Take 1 capsule (5 mg total) by mouth at bedtime as needed. for sleep 05/23/18  Yes Dohmeier, Asencion Partridge, MD    Physical Exam: Vitals:   07/23/18 1930 07/23/18 1945 07/23/18 2000 07/23/18 2209  BP: 121/84  117/79 111/72  Pulse: 74 73 78 74  Resp: 19 18 16 18   Temp:      TempSrc:      SpO2: 97% 96% 98% 96%  Weight:      Height:        Constitutional: NAD, calm, comfortable Eyes: PERRL, lids and conjunctivae normal ENMT: Mucous membranes are moist. Posterior pharynx clear of any exudate or lesions. Neck: normal, supple, no masses, no thyromegaly Respiratory: clear to auscultation bilaterally, no wheezing, no crackles. Normal respiratory effort. No accessory muscle use.  Cardiovascular: Regular rate and rhythm, no murmurs / rubs / gallops. No extremity edema. 2+ pedal pulses. No carotid bruits.  Abdomen: Soft, positive periumbilical erythema and tenderness, no guarding, orders troponin every 6 hours x 3 masses palpated. No hepatosplenomegaly. Bowel sounds positive.  Musculoskeletal: no clubbing / cyanosis.  Good ROM, no contractures. Normal muscle tone.  Skin: Positive erythema infraumbilical area, no rashes, lesions, ulcers on limited dermatological examination. Neurologic: CN 2-12 grossly intact. Sensation intact, DTR normal. Strength 5/5 in all 4.  Psychiatric: Normal judgment and insight. Alert and oriented x 3. Normal mood.   Labs on Admission: I have personally reviewed following labs and imaging studies  CBC: Recent Labs  Lab 07/23/18 1812 07/23/18 1821  WBC 12.4*  --   NEUTROABS 8.8*  --   HGB 15.3 15.3   HCT 46.4 45.0  MCV 91.5  --   PLT 189  --    Basic Metabolic Panel: Recent Labs  Lab 07/23/18 1812 07/23/18 1821  NA 141 140  K 4.0 4.0  CL 103 104  CO2 26  --   GLUCOSE 94 90  BUN 27* 27*  CREATININE 1.15 1.20  CALCIUM 9.5  --    GFR: Estimated Creatinine Clearance: 77.6 mL/min (by C-G formula based on SCr of 1.2 mg/dL). Liver Function Tests: Recent Labs  Lab 07/23/18 1812  AST 17  ALT 33  ALKPHOS 66  BILITOT 0.6  PROT 7.3  ALBUMIN 4.6   Recent Labs  Lab 07/23/18 1812  LIPASE 28   No results for input(s): AMMONIA in the last 168 hours. Coagulation  Profile: No results for input(s): INR, PROTIME in the last 168 hours. Cardiac Enzymes: No results for input(s): CKTOTAL, CKMB, CKMBINDEX, TROPONINI in the last 168 hours. BNP (last 3 results) No results for input(s): PROBNP in the last 8760 hours. HbA1C: No results for input(s): HGBA1C in the last 72 hours. CBG: No results for input(s): GLUCAP in the last 168 hours. Lipid Profile: No results for input(s): CHOL, HDL, LDLCALC, TRIG, CHOLHDL, LDLDIRECT in the last 72 hours. Thyroid Function Tests: No results for input(s): TSH, T4TOTAL, FREET4, T3FREE, THYROIDAB in the last 72 hours. Anemia Panel: No results for input(s): VITAMINB12, FOLATE, FERRITIN, TIBC, IRON, RETICCTPCT in the last 72 hours. Urine analysis:    Component Value Date/Time   COLORURINE YELLOW 07/23/2018 1752   APPEARANCEUR CLEAR 07/23/2018 1752   LABSPEC 1.024 07/23/2018 1752   PHURINE 6.0 07/23/2018 1752   GLUCOSEU NEGATIVE 07/23/2018 1752   GLUCOSEU NEGATIVE 07/07/2016 1129   HGBUR NEGATIVE 07/23/2018 1752   BILIRUBINUR NEGATIVE 07/23/2018 1752   BILIRUBINUR negative 07/10/2016 1512   KETONESUR NEGATIVE 07/23/2018 1752   PROTEINUR NEGATIVE 07/23/2018 1752   UROBILINOGEN 0.2 07/10/2016 1512   UROBILINOGEN 0.2 07/07/2016 1129   NITRITE NEGATIVE 07/23/2018 1752   LEUKOCYTESUR NEGATIVE 07/23/2018 1752    Radiological Exams on  Admission: Ct Abdomen Pelvis W Contrast  Result Date: 07/23/2018 CLINICAL DATA:  58 year old male with possible strangulated umbilical hernia. EXAM: CT ABDOMEN AND PELVIS WITH CONTRAST TECHNIQUE: Multidetector CT imaging of the abdomen and pelvis was performed using the standard protocol following bolus administration of intravenous contrast. CONTRAST:  162mL OMNIPAQUE IOHEXOL 300 MG/ML  SOLN COMPARISON:  CT of the abdomen pelvis dated 10/10/2013 FINDINGS: Lower chest: Minimal bibasilar dependent atelectatic changes. The visualized lung bases are otherwise clear. No intra-abdominal free air or free fluid. Hepatobiliary: No focal liver abnormality is seen. No gallstones, gallbladder wall thickening, or biliary dilatation. Pancreas: Unremarkable. No pancreatic ductal dilatation or surrounding inflammatory changes. Spleen: Normal in size without focal abnormality. Adrenals/Urinary Tract: Adrenal glands are unremarkable. Kidneys are normal, without renal calculi, focal lesion, or hydronephrosis. Bladder is unremarkable. Stomach/Bowel: There are scattered colonic diverticula without active inflammatory changes. There is no bowel obstruction or active inflammation. Appendectomy. Vascular/Lymphatic: Mild aortoiliac atherosclerotic disease. No portal venous gas. There is no adenopathy. Reproductive: The prostate and seminal vesicles are grossly unremarkable. No pelvic mass. Other: There is a small fat containing umbilical hernia. The neck of the hernia measures 2.5 cm in diameter. There is inflammatory changes of the herniated fat and surrounding subcutaneous soft tissues suggestive of strangulation or incarceration. Clinical correlation is recommended. No drainable fluid collection identified. There is no herniation of bowel. Musculoskeletal: No acute or significant osseous findings. IMPRESSION: 1. Small fat containing umbilical hernia with inflammatory changes suggestive of strangulation or incarceration. Clinical  correlation is recommended. No fluid collection. 2. Colonic diverticulosis. 3. Five aortic Atherosclerosis (ICD10-I70.0). Electronically Signed   By: Anner Crete M.D.   On: 07/23/2018 21:13    EKG: Independently reviewed.   Assessment/Plan Principal Problem:   Incarcerated umbilical hernia Observation/MedSurg. Keep n.p.o. Gentle IV fluids. Toradol 30 mg IVP x1. Continue morphine 4 mg IVP every 3 hours PRN. General surgery will evaluate in the morning.  Active Problems:   HTN (hypertension) Continue lisinopril 20 mg p.o. daily. Continue HCTZ 25 mg p.o. daily.    Seizures (HCC) Continue Lamictal 100 mg p.o. twice daily.    OSA on CPAP Continue CPAP at bedtime.    GAD (generalized anxiety disorder) Continue Wellbutrin 150  mg p.o. daily. Continue Valium 5 mg p.o. daily.   DVT prophylaxis: SCDs. Code Status: Full code. Family Communication: Disposition Plan: Observation for pain control and general evaluation in a.m. Consults called: Routine general surgery consult. Admission status: Observation/MedSurg.   Reubin Milan MD [facility]  If 7PM-7AM, please contact night-coverage [email or web address]  07/23/2018, 10:15 PM    This document was prepared using Dragon voice recognition software may contain some unintended transcription errors.

## 2018-07-23 NOTE — Progress Notes (Signed)
Patient presents to clinic today c/o pain and redness at site of umbilical hernia over the past few days. Notes today severity has worsened significantly. Pain a 8-9/10. Denies change to bowel of bladder habits. Denies fever, chills, nausea or vomiting. Notes unable to push in the hernia.   Past Medical History:  Diagnosis Date  . Anxiety   . Atrial arrhythmia   . Barrett esophagus   . Bipolar 1 disorder ([place])   . Cavernous angioma   . Depression   . History of chicken pox   . Hypertension   . Seizures ([place])    1 in 2005, 1 in 2016   . Sleep apnea   . Sleep apnea   . Stroke ([place]) 11/2014   No residual deficits  . Venous malformation    Brain    Current Outpatient Medications on File Prior to Visit  Medication Sig Dispense Refill  . atorvastatin (LIPITOR) 20 MG tablet TAKE 1 TABLET BY MOUTH EVERY DAY 90 tablet 1  . buPROPion (WELLBUTRIN XL) 150 MG 24 hr tablet Take 1 tablet (150 mg total) by mouth daily. 90 tablet 1  . cariprazine (VRAYLAR) capsule Take 1 capsule (1.5 mg total) by mouth daily. 90 capsule 1  . diazepam (VALIUM) 5 MG tablet Take 1 tablet (5 mg total) by mouth daily. 90 tablet 1  . lamoTRIgine (LAMICTAL) 100 MG tablet Take 1 tablet (100 mg total) by mouth 2 (two) times daily. 180 tablet 6  . lisinopril-hydrochlorothiazide (ZESTORETIC) 20-25 MG tablet TAKE 1 TABLET BY MOUTH EVERY DAY 90 tablet 1  . meclizine (MEDI-MECLIZINE) 25 MG tablet Take 1 tablet (25 mg total) by mouth 3 (three) times daily as needed for dizziness. 60 tablet 6  . zaleplon (SONATA) 5 MG capsule Take 1 capsule (5 mg total) by mouth at bedtime as needed. for sleep 30 capsule 1   No current facility-administered medications on file prior to visit.     No Known Allergies  Family History  Problem Relation Age of Onset  . Aneurysm Maternal Grandmother   . Asthma Mother   . Diabetes Mother   . Hypertension Mother   . Diabetes Brother   . Diabetes Brother   . Pancreatic cancer Father   .  Diabetes Father   . Cancer Father        Pancreatic  . Diabetes Paternal Grandmother   . Diabetes Paternal Grandfather   . Colon cancer Neg Hx     Social History   Socioeconomic History  . Marital status: Married    Spouse name: [name]   . Number of children: 3  . Years of education: 16+  . Highest education level: Not on file  Occupational History  . 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 Financial resource strain: Not on file  . Food insecurity    Worry: Not on file    Inability: Not on file  . Transportation needs    Medical: Not on file    Non-medical: Not on file  Tobacco Use  . Smoking status: Never Smoker  . Smokeless tobacco: Never Used  Substance and Sexual Activity  . Alcohol use: Yes    Alcohol/week: 4.0 standard drinks    Types: 4 Standard drinks or equivalent per week    Frequency: Never    Comment: was 4-5 beers/week  . Drug use: No  . Sexual activity: Yes  Lifestyle  . Physical activity    Days per week: Not on file  Minutes per session: Not on file  . Stress: Not on file  Relationships  . Social Herbalist on phone: Not on file    Gets together: Not on file    Attends religious service: Not on file    Active member of club or organization: Not on file    Attends meetings of clubs or organizations: Not on file    Relationship status: Not on file  Other Topics Concern  . Not on file  Social History Narrative   Lives in [place].    Married. 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      Caffeine use: 2 cup coffee per day   Review of Systems - See HPI.  All other ROS are negative.  BP 118/86   Pulse 81   Temp 98.6 F (37 C) (Skin)   Resp 16   Ht 5\' 9"  (1.753 m)   Wt 221 lb (100.2 kg)   SpO2 98%   BMI 32.64 kg/m   Physical Exam Vitals signs reviewed.  Constitutional:      General: He is not in acute distress.    Appearance: He is well-developed. He is ill-appearing. He is not  toxic-appearing.  HENT:     Head: Normocephalic and atraumatic.     Mouth/Throat:     Mouth: Mucous membranes are moist.  Eyes:     Pupils: Pupils are equal, round, and reactive to light.  Cardiovascular:     Rate and Rhythm: Normal rate and regular rhythm.  Pulmonary:     Effort: Pulmonary effort is normal.  Abdominal:     Hernia: A hernia is present. Hernia is present in the umbilical area (unable to reduce. Significant area of surrounding erythema noted.).  Neurological:     Mental Status: He is alert.      Assessment/Plan: 1. Strangulated umbilical hernia Incarcerated hernia with concern for strangulation. Surrounding erythema noted.Needs ER assessment Patient is stable during visit and chooses to have wife carry him to ER. Declines EMS transport. He is to go directly to [facility] for assessment.   Leeanne Rio, PA-C

## 2018-07-23 NOTE — ED Notes (Signed)
U/A per MD order. Pt encouraged to call for assistance to restroom as needed; urinal provided at bedside. [employer]

## 2018-07-23 NOTE — ED Triage Notes (Signed)
Sent by PCP for possible strangulated umbilical hernia. Pt states he has had the hernia for 10 years and never been a problem until now.

## 2018-07-24 ENCOUNTER — Observation Stay (HOSPITAL_COMMUNITY): Payer: Managed Care, Other (non HMO) | Admitting: Anesthesiology

## 2018-07-24 ENCOUNTER — Encounter: Payer: Self-pay | Admitting: Physician Assistant

## 2018-07-24 ENCOUNTER — Encounter (HOSPITAL_COMMUNITY)
Admission: EM | Disposition: A | Discharge status: Home / Self Care | Payer: Self-pay | Source: Home / Self Care | Attending: Internal Medicine

## 2018-07-24 ENCOUNTER — Encounter (HOSPITAL_COMMUNITY): Payer: Self-pay | Admitting: Anesthesiology

## 2018-07-24 DIAGNOSIS — Z1159 Encounter for screening for other viral diseases: Secondary | ICD-10-CM | POA: Diagnosis not present

## 2018-07-24 DIAGNOSIS — E785 Hyperlipidemia, unspecified: Secondary | ICD-10-CM | POA: Diagnosis present

## 2018-07-24 DIAGNOSIS — Z825 Family history of asthma and other chronic lower respiratory diseases: Secondary | ICD-10-CM | POA: Diagnosis not present

## 2018-07-24 DIAGNOSIS — K460 Unspecified abdominal hernia with obstruction, without gangrene: Secondary | ICD-10-CM | POA: Diagnosis present

## 2018-07-24 DIAGNOSIS — Z79899 Other long term (current) drug therapy: Secondary | ICD-10-CM | POA: Diagnosis not present

## 2018-07-24 DIAGNOSIS — M549 Dorsalgia, unspecified: Secondary | ICD-10-CM | POA: Diagnosis present

## 2018-07-24 DIAGNOSIS — I10 Essential (primary) hypertension: Secondary | ICD-10-CM | POA: Diagnosis present

## 2018-07-24 DIAGNOSIS — K2270 Barrett's esophagus without dysplasia: Secondary | ICD-10-CM | POA: Diagnosis present

## 2018-07-24 DIAGNOSIS — I482 Chronic atrial fibrillation, unspecified: Secondary | ICD-10-CM | POA: Diagnosis present

## 2018-07-24 DIAGNOSIS — G4733 Obstructive sleep apnea (adult) (pediatric): Secondary | ICD-10-CM | POA: Diagnosis present

## 2018-07-24 DIAGNOSIS — K429 Umbilical hernia without obstruction or gangrene: Secondary | ICD-10-CM | POA: Diagnosis present

## 2018-07-24 DIAGNOSIS — K420 Umbilical hernia with obstruction, without gangrene: Secondary | ICD-10-CM | POA: Diagnosis present

## 2018-07-24 DIAGNOSIS — E860 Dehydration: Secondary | ICD-10-CM | POA: Diagnosis present

## 2018-07-24 DIAGNOSIS — F411 Generalized anxiety disorder: Secondary | ICD-10-CM | POA: Diagnosis present

## 2018-07-24 DIAGNOSIS — Z833 Family history of diabetes mellitus: Secondary | ICD-10-CM | POA: Diagnosis not present

## 2018-07-24 DIAGNOSIS — Z8673 Personal history of transient ischemic attack (TIA), and cerebral infarction without residual deficits: Secondary | ICD-10-CM | POA: Diagnosis not present

## 2018-07-24 DIAGNOSIS — G40909 Epilepsy, unspecified, not intractable, without status epilepticus: Secondary | ICD-10-CM | POA: Diagnosis present

## 2018-07-24 DIAGNOSIS — Z800 Family history of malignant neoplasm of digestive organs: Secondary | ICD-10-CM | POA: Diagnosis not present

## 2018-07-24 DIAGNOSIS — Z8249 Family history of ischemic heart disease and other diseases of the circulatory system: Secondary | ICD-10-CM | POA: Diagnosis not present

## 2018-07-24 DIAGNOSIS — G8929 Other chronic pain: Secondary | ICD-10-CM | POA: Diagnosis present

## 2018-07-24 DIAGNOSIS — I959 Hypotension, unspecified: Secondary | ICD-10-CM | POA: Diagnosis present

## 2018-07-24 HISTORY — PX: UMBILICAL HERNIA REPAIR: SHX196

## 2018-07-24 LAB — CBC WITH DIFFERENTIAL/PLATELET
Abs Immature Granulocytes: 0.08 10*3/uL — ABNORMAL HIGH (ref 0.00–0.07)
Basophils Absolute: 0.1 10*3/uL (ref 0.0–0.1)
Basophils Relative: 1 %
Eosinophils Absolute: 0.4 10*3/uL (ref 0.0–0.5)
Eosinophils Relative: 4 %
HCT: 42.2 % (ref 39.0–52.0)
Hemoglobin: 13.9 g/dL (ref 13.0–17.0)
Immature Granulocytes: 1 %
Lymphocytes Relative: 15 %
Lymphs Abs: 1.6 10*3/uL (ref 0.7–4.0)
MCH: 30.4 pg (ref 26.0–34.0)
MCHC: 32.9 g/dL (ref 30.0–36.0)
MCV: 92.3 fL (ref 80.0–100.0)
Monocytes Absolute: 1 10*3/uL (ref 0.1–1.0)
Monocytes Relative: 10 %
Neutro Abs: 7.4 10*3/uL (ref 1.7–7.7)
Neutrophils Relative %: 69 %
Platelets: 157 10*3/uL (ref 150–400)
RBC: 4.57 MIL/uL (ref 4.22–5.81)
RDW: 13.3 % (ref 11.5–15.5)
WBC: 10.6 10*3/uL — ABNORMAL HIGH (ref 4.0–10.5)
nRBC: 0 % (ref 0.0–0.2)

## 2018-07-24 LAB — COMPREHENSIVE METABOLIC PANEL
ALT: 26 U/L (ref 0–44)
AST: 14 U/L — ABNORMAL LOW (ref 15–41)
Albumin: 4.1 g/dL (ref 3.5–5.0)
Alkaline Phosphatase: 56 U/L (ref 38–126)
Anion gap: 8 (ref 5–15)
BUN: 23 mg/dL — ABNORMAL HIGH (ref 6–20)
CO2: 26 mmol/L (ref 22–32)
Calcium: 9 mg/dL (ref 8.9–10.3)
Chloride: 104 mmol/L (ref 98–111)
Creatinine, Ser: 1.01 mg/dL (ref 0.61–1.24)
GFR calc Af Amer: >60 mL/min (ref >60)
GFR calc non Af Amer: >60 mL/min (ref >60)
Glucose, Bld: 109 mg/dL — ABNORMAL HIGH (ref 70–99)
Potassium: 3.9 mmol/L (ref 3.5–5.1)
Sodium: 138 mmol/L (ref 135–145)
Total Bilirubin: 0.9 mg/dL (ref 0.3–1.2)
Total Protein: 6.6 g/dL (ref 6.5–8.1)

## 2018-07-24 LAB — HIV ANTIBODY (ROUTINE TESTING W REFLEX): HIV Screen 4th Generation wRfx: NONREACTIVE

## 2018-07-24 LAB — APTT: aPTT: 26 seconds (ref 24–36)

## 2018-07-24 LAB — PROTIME-INR
INR: 1 (ref 0.8–1.2)
Prothrombin Time: 13.1 seconds (ref 11.4–15.2)

## 2018-07-24 SURGERY — REPAIR, HERNIA, UMBILICAL, ADULT
Anesthesia: General

## 2018-07-24 MED ORDER — LISINOPRIL 20 MG PO TABS
20.0000 mg | ORAL_TABLET | Freq: Every day | ORAL | Status: DC
  Administered 2018-07-24 – 2018-07-25 (×2): 20 mg via ORAL
  Filled 2018-07-24 (×2): qty 1

## 2018-07-24 MED ORDER — EPHEDRINE SULFATE 50 MG/ML IJ SOLN
INTRAMUSCULAR | Status: DC | PRN
  Administered 2018-07-24: 5 mg via INTRAVENOUS

## 2018-07-24 MED ORDER — 0.9 % SODIUM CHLORIDE (POUR BTL) OPTIME
TOPICAL | Status: DC | PRN
  Administered 2018-07-24: 1000 mL

## 2018-07-24 MED ORDER — MORPHINE SULFATE (PF) 2 MG/ML IV SOLN: 2.0000 mg | INTRAVENOUS | Status: DC | PRN

## 2018-07-24 MED ORDER — ACETAMINOPHEN 500 MG PO TABS
1000.0000 mg | ORAL_TABLET | Freq: Three times a day (TID) | ORAL | Status: DC
  Administered 2018-07-24 – 2018-07-25 (×2): 1000 mg via ORAL
  Filled 2018-07-24 (×2): qty 2

## 2018-07-24 MED ORDER — ACETAMINOPHEN 160 MG/5ML PO SOLN: 1000.0000 mg | Freq: Once | ORAL | Status: DC | PRN

## 2018-07-24 MED ORDER — BUPIVACAINE-EPINEPHRINE 0.25% -1:200000 IJ SOLN
INTRAMUSCULAR | Status: DC | PRN
  Administered 2018-07-24: 10 mL

## 2018-07-24 MED ORDER — FENTANYL CITRATE (PF) 100 MCG/2ML IJ SOLN
INTRAMUSCULAR | Status: AC
  Filled 2018-07-24: qty 2

## 2018-07-24 MED ORDER — SUGAMMADEX SODIUM 200 MG/2ML IV SOLN
INTRAVENOUS | Status: DC | PRN
  Administered 2018-07-24: 200 mg via INTRAVENOUS

## 2018-07-24 MED ORDER — ROCURONIUM BROMIDE 100 MG/10ML IV SOLN
INTRAVENOUS | Status: DC | PRN
  Administered 2018-07-24: 5 mg via INTRAVENOUS
  Administered 2018-07-24: 20 mg via INTRAVENOUS
  Administered 2018-07-24: 5 mg via INTRAVENOUS
  Administered 2018-07-24: 10 mg via INTRAVENOUS

## 2018-07-24 MED ORDER — CEFAZOLIN SODIUM-DEXTROSE 1-4 GM/50ML-% IV SOLN
INTRAVENOUS | Status: DC | PRN
  Administered 2018-07-24: 2 g via INTRAVENOUS

## 2018-07-24 MED ORDER — OXYCODONE HCL 5 MG PO TABS: 5.0000 mg | ORAL_TABLET | Freq: Once | ORAL | Status: DC | PRN

## 2018-07-24 MED ORDER — FENTANYL CITRATE (PF) 100 MCG/2ML IJ SOLN
25.0000 ug | INTRAMUSCULAR | Status: DC | PRN
  Administered 2018-07-24: 25 ug via INTRAVENOUS

## 2018-07-24 MED ORDER — PROPOFOL 10 MG/ML IV BOLUS
INTRAVENOUS | Status: AC
  Filled 2018-07-24: qty 20

## 2018-07-24 MED ORDER — LIDOCAINE HCL (PF) 1 % IJ SOLN
INTRAMUSCULAR | Status: DC | PRN
  Administered 2018-07-24: 10 mL

## 2018-07-24 MED ORDER — MIDAZOLAM HCL 5 MG/5ML IJ SOLN
INTRAMUSCULAR | Status: DC | PRN
  Administered 2018-07-24 (×2): 1 mg via INTRAVENOUS

## 2018-07-24 MED ORDER — OXYCODONE HCL 5 MG PO TABS
5.0000 mg | ORAL_TABLET | ORAL | Status: DC | PRN
  Administered 2018-07-25: 5 mg via ORAL
  Filled 2018-07-24: qty 1

## 2018-07-24 MED ORDER — DEXAMETHASONE SODIUM PHOSPHATE 10 MG/ML IJ SOLN
INTRAMUSCULAR | Status: AC
  Filled 2018-07-24: qty 1

## 2018-07-24 MED ORDER — ATORVASTATIN CALCIUM 20 MG PO TABS
20.0000 mg | ORAL_TABLET | Freq: Every day | ORAL | Status: DC
  Administered 2018-07-24 – 2018-07-25 (×2): 20 mg via ORAL
  Filled 2018-07-24 (×2): qty 1

## 2018-07-24 MED ORDER — HYDROCHLOROTHIAZIDE 25 MG PO TABS
25.0000 mg | ORAL_TABLET | Freq: Every day | ORAL | Status: DC
  Administered 2018-07-24 – 2018-07-25 (×2): 25 mg via ORAL
  Filled 2018-07-24 (×2): qty 1

## 2018-07-24 MED ORDER — LIDOCAINE HCL (PF) 1 % IJ SOLN
INTRAMUSCULAR | Status: AC
  Filled 2018-07-24: qty 30

## 2018-07-24 MED ORDER — LACTATED RINGERS IV SOLN
INTRAVENOUS | Status: DC
  Administered 2018-07-24: 13:00:00 via INTRAVENOUS

## 2018-07-24 MED ORDER — SUCCINYLCHOLINE CHLORIDE 20 MG/ML IJ SOLN
INTRAMUSCULAR | Status: DC | PRN
  Administered 2018-07-24: 110 mg via INTRAVENOUS

## 2018-07-24 MED ORDER — DEXAMETHASONE SODIUM PHOSPHATE 10 MG/ML IJ SOLN
INTRAMUSCULAR | Status: DC | PRN
  Administered 2018-07-24: 8 mg via INTRAVENOUS

## 2018-07-24 MED ORDER — BUPIVACAINE-EPINEPHRINE (PF) 0.25% -1:200000 IJ SOLN
INTRAMUSCULAR | Status: AC
  Filled 2018-07-24: qty 30

## 2018-07-24 MED ORDER — FENTANYL CITRATE (PF) 100 MCG/2ML IJ SOLN
INTRAMUSCULAR | Status: AC
  Administered 2018-07-24: 25 ug via INTRAVENOUS
  Filled 2018-07-24: qty 2

## 2018-07-24 MED ORDER — PROPOFOL 10 MG/ML IV BOLUS
INTRAVENOUS | Status: DC | PRN
  Administered 2018-07-24: 80 mg via INTRAVENOUS

## 2018-07-24 MED ORDER — KETOROLAC TROMETHAMINE 15 MG/ML IJ SOLN
15.0000 mg | Freq: Four times a day (QID) | INTRAMUSCULAR | Status: DC | PRN
  Administered 2018-07-24: 15 mg via INTRAVENOUS

## 2018-07-24 MED ORDER — MIDAZOLAM HCL 2 MG/2ML IJ SOLN
INTRAMUSCULAR | Status: AC
  Filled 2018-07-24: qty 2

## 2018-07-24 MED ORDER — ACETAMINOPHEN 10 MG/ML IV SOLN
1000.0000 mg | Freq: Once | INTRAVENOUS | Status: DC | PRN
  Administered 2018-07-24: 1000 mg via INTRAVENOUS

## 2018-07-24 MED ORDER — LIDOCAINE HCL (CARDIAC) PF 100 MG/5ML IV SOSY
PREFILLED_SYRINGE | INTRAVENOUS | Status: DC | PRN
  Administered 2018-07-24: 30 mg via INTRAVENOUS

## 2018-07-24 MED ORDER — ACETAMINOPHEN 500 MG PO TABS: 1000.0000 mg | ORAL_TABLET | Freq: Once | ORAL | Status: DC | PRN

## 2018-07-24 MED ORDER — CEFAZOLIN SODIUM-DEXTROSE 1-4 GM/50ML-% IV SOLN
1.0000 g | Freq: Three times a day (TID) | INTRAVENOUS | Status: DC
  Administered 2018-07-24 – 2018-07-25 (×2): 1 g via INTRAVENOUS
  Filled 2018-07-24 (×4): qty 50

## 2018-07-24 MED ORDER — ACETAMINOPHEN 10 MG/ML IV SOLN
INTRAVENOUS | Status: AC
  Administered 2018-07-24: 1000 mg via INTRAVENOUS
  Filled 2018-07-24: qty 100

## 2018-07-24 MED ORDER — CEFAZOLIN SODIUM-DEXTROSE 2-4 GM/100ML-% IV SOLN
INTRAVENOUS | Status: AC
  Filled 2018-07-24: qty 100

## 2018-07-24 MED ORDER — FENTANYL CITRATE (PF) 100 MCG/2ML IJ SOLN
INTRAMUSCULAR | Status: DC | PRN
  Administered 2018-07-24 (×2): 50 ug via INTRAVENOUS

## 2018-07-24 MED ORDER — ONDANSETRON HCL 4 MG/2ML IJ SOLN
INTRAMUSCULAR | Status: DC | PRN
  Administered 2018-07-24: 4 mg via INTRAVENOUS

## 2018-07-24 MED ORDER — ENOXAPARIN SODIUM 40 MG/0.4ML ~~LOC~~ SOLN
40.0000 mg | Freq: Every day | SUBCUTANEOUS | Status: DC
  Administered 2018-07-25: 40 mg via SUBCUTANEOUS
  Filled 2018-07-24: qty 0.4

## 2018-07-24 MED ORDER — OXYCODONE HCL 5 MG/5ML PO SOLN: 5.0000 mg | Freq: Once | ORAL | Status: DC | PRN

## 2018-07-24 MED ORDER — FENTANYL CITRATE (PF) 100 MCG/2ML IJ SOLN: 25.0000 ug | INTRAMUSCULAR | Status: DC | PRN

## 2018-07-24 MED ORDER — KETOROLAC TROMETHAMINE 15 MG/ML IJ SOLN
INTRAMUSCULAR | Status: AC
  Administered 2018-07-24: 16:00:00 15 mg via INTRAVENOUS
  Filled 2018-07-24: qty 1

## 2018-07-24 SURGICAL SUPPLY — 53 items
ADH SKN CLS APL DERMABOND .7 (GAUZE/BANDAGES/DRESSINGS)
APL SKNCLS STERI-STRIP NONHPOA (GAUZE/BANDAGES/DRESSINGS) ×1
BENZOIN TINCTURE PRP APPL 2/3 (GAUZE/BANDAGES/DRESSINGS) ×3 IMPLANT
BLADE HEX COATED 2.75 (ELECTRODE) ×3 IMPLANT
BLADE SURG 15 STRL LF DISP TIS (BLADE) ×1 IMPLANT
BLADE SURG 15 STRL SS (BLADE) ×3
CLOSURE WOUND 1/2 X4 (GAUZE/BANDAGES/DRESSINGS) ×1
COVER SURGICAL LIGHT HANDLE (MISCELLANEOUS) ×3 IMPLANT
COVER WAND RF STERILE (DRAPES) IMPLANT
DECANTER SPIKE VIAL GLASS SM (MISCELLANEOUS) ×5 IMPLANT
DERMABOND ADVANCED (GAUZE/BANDAGES/DRESSINGS)
DERMABOND ADVANCED .7 DNX12 (GAUZE/BANDAGES/DRESSINGS) IMPLANT
DRAIN PENROSE 18X1/2 LTX STRL (DRAIN) ×3 IMPLANT
DRAPE LAPAROSCOPIC ABDOMINAL (DRAPES) ×3 IMPLANT
DRSG TEGADERM 4X4.75 (GAUZE/BANDAGES/DRESSINGS) ×2 IMPLANT
ELECT PENCIL ROCKER SW 15FT (MISCELLANEOUS) ×3 IMPLANT
ELECT REM PT RETURN 15FT ADLT (MISCELLANEOUS) ×3 IMPLANT
GAUZE SPONGE 2X2 8PLY STRL LF (GAUZE/BANDAGES/DRESSINGS) IMPLANT
GAUZE SPONGE 4X4 12PLY STRL (GAUZE/BANDAGES/DRESSINGS) ×3 IMPLANT
GLOVE BIO SURGEON STRL SZ 6 (GLOVE) ×3 IMPLANT
GLOVE BIO SURGEON STRL SZ7 (GLOVE) ×6 IMPLANT
GLOVE BIOGEL PI IND STRL 6.5 (GLOVE) ×1 IMPLANT
GLOVE BIOGEL PI IND STRL 7.0 (GLOVE) ×1 IMPLANT
GLOVE BIOGEL PI INDICATOR 6.5 (GLOVE) ×2
GLOVE BIOGEL PI INDICATOR 7.0 (GLOVE) ×2
GLOVE INDICATOR 6.5 STRL GRN (GLOVE) ×3 IMPLANT
GOWN STRL REUS W/ TWL XL LVL3 (GOWN DISPOSABLE) ×3 IMPLANT
GOWN STRL REUS W/TWL 2XL LVL3 (GOWN DISPOSABLE) ×3 IMPLANT
GOWN STRL REUS W/TWL XL LVL3 (GOWN DISPOSABLE) ×9
KIT BASIN OR (CUSTOM PROCEDURE TRAY) ×3 IMPLANT
KIT TURNOVER KIT A (KITS) ×2 IMPLANT
NDL HYPO 25X1 1.5 SAFETY (NEEDLE) ×1 IMPLANT
NEEDLE HYPO 25X1 1.5 SAFETY (NEEDLE) ×3 IMPLANT
NS IRRIG 1000ML POUR BTL (IV SOLUTION) ×3 IMPLANT
PACK BASIC VI WITH GOWN DISP (CUSTOM PROCEDURE TRAY) ×3 IMPLANT
SPONGE GAUZE 2X2 STER 10/PKG (GAUZE/BANDAGES/DRESSINGS) ×2
SPONGE LAP 18X18 RF (DISPOSABLE) ×3 IMPLANT
STRIP CLOSURE SKIN 1/2X4 (GAUZE/BANDAGES/DRESSINGS) ×2 IMPLANT
SUT MNCRL AB 4-0 PS2 18 (SUTURE) ×3 IMPLANT
SUT PROLENE 2 0 SH DA (SUTURE) ×12 IMPLANT
SUT SILK 2 0 SH (SUTURE) IMPLANT
SUT SILK 3 0 (SUTURE)
SUT SILK 3-0 18XBRD TIE 12 (SUTURE) IMPLANT
SUT VIC AB 2-0 SH 27 (SUTURE) ×6
SUT VIC AB 2-0 SH 27X BRD (SUTURE) ×2 IMPLANT
SUT VIC AB 3-0 SH 27 (SUTURE) ×6
SUT VIC AB 3-0 SH 27XBRD (SUTURE) ×2 IMPLANT
SUT VICRYL 0 UR6 27IN ABS (SUTURE) ×4 IMPLANT
SYR BULB IRRIGATION 50ML (SYRINGE) ×3 IMPLANT
SYR CONTROL 10ML LL (SYRINGE) ×3 IMPLANT
TAPE STRIPS DRAPE STRL (GAUZE/BANDAGES/DRESSINGS) ×2 IMPLANT
TOWEL OR 17X26 10 PK STRL BLUE (TOWEL DISPOSABLE) ×3 IMPLANT
YANKAUER SUCT BULB TIP 10FT TU (MISCELLANEOUS) ×3 IMPLANT

## 2018-07-24 NOTE — Plan of Care (Signed)
  Problem: Education: ?Goal: Knowledge of General Education information will improve ?Description: Including pain rating scale, medication(s)/side effects and non-pharmacologic comfort measures ?Outcome: Progressing ?  ?Problem: Health Behavior/Discharge Planning: ?Goal: Ability to manage health-related needs will improve ?Outcome: Progressing ?  ?Problem: Clinical Measurements: ?Goal: Will remain free from infection ?Outcome: Progressing ?  ?Problem: Clinical Measurements: ?Goal: Diagnostic test results will improve ?Outcome: Progressing ?  ?Problem: Clinical Measurements: ?Goal: Respiratory complications will improve ?Outcome: Progressing ?  ?Problem: Clinical Measurements: ?Goal: Cardiovascular complication will be avoided ?Outcome: Progressing ?  ?

## 2018-07-24 NOTE — Anesthesia Preprocedure Evaluation (Signed)
Anesthesia Evaluation  Patient identified by MRN, date of birth, ID band Patient awake    Reviewed: Allergy & Precautions, NPO status , Patient's Chart, lab work & pertinent test results  History of Anesthesia Complications Negative for: history of anesthetic complications  Airway Mallampati: III  TM Distance: >3 FB Neck ROM: Full    Dental  (+) Dental Advisory Given   Pulmonary neg shortness of breath, sleep apnea and Continuous Positive Airway Pressure Ventilation , neg recent URI,    breath sounds clear to auscultation       Cardiovascular hypertension, Pt. on medications (-) angina(-) Past MI and (-) CHF + dysrhythmias Atrial Fibrillation  Rhythm:Regular     Neuro/Psych Seizures -, Well Controlled,  PSYCHIATRIC DISORDERS Anxiety Depression Bipolar Disorder Left sided numbness and vertigo  CVA, Residual Symptoms    GI/Hepatic Neg liver ROS, unbilical hernia    Endo/Other  negative endocrine ROS  Renal/GU negative Renal ROS     Musculoskeletal negative musculoskeletal ROS (+)   Abdominal   Peds  Hematology negative hematology ROS (+)   Anesthesia Other Findings   Reproductive/Obstetrics                             Anesthesia Physical Anesthesia Plan  ASA: II  Anesthesia Plan: General   Post-op Pain Management:    Induction: Intravenous, Rapid sequence and Cricoid pressure planned  PONV Risk Score and Plan: 2 and Dexamethasone and Ondansetron  Airway Management Planned: Oral ETT  Additional Equipment: None  Intra-op Plan:   Post-operative Plan: Extubation in OR  Informed Consent: I have reviewed the patients History and Physical, chart, labs and discussed the procedure including the risks, benefits and alternatives for the proposed anesthesia with the patient or authorized representative who has indicated his/her understanding and acceptance.     Dental advisory  given  Plan Discussed with: CRNA and Surgeon  Anesthesia Plan Comments:         Anesthesia Quick Evaluation

## 2018-07-24 NOTE — Interval H&P Note (Signed)
History and Physical Interval Note:  07/24/2018 1:05 PM  Todd Harris  has presented today for surgery, with the diagnosis of umbilical hernia.  The various methods of treatment have been discussed with the patient and family. After consideration of risks, benefits and other options for treatment, the patient has consented to  Procedure(s): HERNIA REPAIR UMBILICAL ADULT (N/A) as a surgical intervention.  The patient's history has been reviewed, patient examined, no change in status, stable for surgery.  I have reviewed the patient's chart and labs.  Questions were answered to the patient's satisfaction.     Stark Klein

## 2018-07-24 NOTE — Transfer of Care (Signed)
Immediate Anesthesia Transfer of Care Note  Patient: Todd Harris  Procedure(s) Performed: HERNIA REPAIR UMBILICAL ADULT (N/A )  Patient Location: PACU  Anesthesia Type:General  Level of Consciousness: awake, alert  and oriented  Airway & Oxygen Therapy: Patient Spontanous Breathing and Patient connected to face mask oxygen  Post-op Assessment: Report given to RN and Post -op Vital signs reviewed and stable  Post vital signs: Reviewed and stable  Last Vitals:  Vitals Value Taken Time  BP    Temp    Pulse 88 07/24/18 1520  Resp 15 07/24/18 1520  SpO2 97 % 07/24/18 1520  Vitals shown include unvalidated device data.  Last Pain:  Vitals:   07/24/18 1236  TempSrc: Oral  PainSc:          Complications: No apparent anesthesia complications

## 2018-07-24 NOTE — ED Notes (Signed)
Per Dr Barry Dienes, On Call [facility] Day provider- pt is to receive PO daily meds at this time.  She is unsure as to when pt will be scheduled for surgery.

## 2018-07-24 NOTE — ED Notes (Signed)
Paged General Surgery contact for [facility] Consult regarding whether to administer PO of pts daily meds, and to determine if pt is going to surgery today. Awaiting response.

## 2018-07-24 NOTE — H&P (View-Only) (Signed)
Reason for Consult:umbilical hernia Referring Physician: Dr. Tennis Must  Todd Harris is an 58 y.o. male.  HPI: This is a 58 year old gentleman with a known umbilical hernia.  He reports he has had it for many years.  It sounds like it is always been chronically incarcerated.  He started developing erythema and pain around the umbilicus 2 days ago so presented to the emergency room.  He underwent a CT scan of the abdomen and pelvis showing a small umbilical hernia containing incarcerated fat which appeared inflamed as well.  He has no obstructive symptoms and is otherwise without complaints.  He has multiple comorbidities including bipolar disorder, history of CVA, and sleep apnea.  Past Medical History:  Diagnosis Date  . Anxiety   . Atrial arrhythmia   . Barrett esophagus   . Bipolar 1 disorder ([name])   . Cavernous angioma   . Depression   . History of chicken pox   . Hypertension   . Seizures ([name])    1 in 2005, 1 in 2016   . Sleep apnea   . Sleep apnea   . Stroke ([name]) 11/2014   No residual deficits  . Venous malformation    Brain    Past Surgical History:  Procedure Laterality Date  . APPENDECTOMY  1970   age 60  . bone spur     age 58  . kidney stones    . RAD ONC MR BRAIN GAMMA KNIFE  12/05/2016  . TONSILLECTOMY  1968    Family History  Problem Relation Age of Onset  . Aneurysm Maternal Grandmother   . Asthma Mother   . Diabetes Mother   . Hypertension Mother   . Diabetes Brother   . Diabetes Brother   . Pancreatic cancer Father   . Diabetes Father   . Cancer Father        Pancreatic  . Diabetes Paternal Grandmother   . Diabetes Paternal Grandfather   . Colon cancer Neg Hx     Social History:  reports that he has never smoked. He has never used smokeless tobacco. He reports current alcohol use of about 4.0 standard drinks of alcohol per week. He reports that he does not use drugs.  Allergies: No Known Allergies  Medications: I have reviewed the  patient's current medications.  Results for orders placed or performed during the hospital encounter of 07/23/18 (from the past 48 hour(s))  Urinalysis, Routine w reflex microscopic     Status: None   Collection Time: 07/23/18  5:52 PM  Result Value Ref Range   Color, Urine YELLOW YELLOW   APPearance CLEAR CLEAR   Specific Gravity, Urine 1.024 1.005 - 1.030   pH 6.0 5.0 - 8.0   Glucose, UA NEGATIVE NEGATIVE mg/dL   Hgb urine dipstick NEGATIVE NEGATIVE   Bilirubin Urine NEGATIVE NEGATIVE   Ketones, ur NEGATIVE NEGATIVE mg/dL   Protein, ur NEGATIVE NEGATIVE mg/dL   Nitrite NEGATIVE NEGATIVE   Leukocytes,Ua NEGATIVE NEGATIVE    Comment: Performed at [facility] [address], [place], [place] [address]  SARS Coronavirus 2 (CEPHEID - Performed in [facility] hospital lab), Hosp Order     Status: None   Collection Time: 07/23/18  6:04 PM   Specimen: Nasopharyngeal Swab  Result Value Ref Range   SARS Coronavirus 2 NEGATIVE NEGATIVE    Comment: (NOTE) If result is NEGATIVE SARS-CoV-2 target nucleic acids are NOT DETECTED. The SARS-CoV-2 RNA is generally detectable in upper and lower  respiratory specimens during the acute phase of infection. The lowest  concentration of SARS-CoV-2 viral copies this assay can detect is 250  copies / mL. A negative result does not preclude SARS-CoV-2 infection  and should not be used as the sole basis for treatment or other  patient management decisions.  A negative result may occur with  improper specimen collection / handling, submission of specimen other  than nasopharyngeal swab, presence of viral mutation(s) within the  areas targeted by this assay, and inadequate number of viral copies  (<250 copies / mL). A negative result must be combined with clinical  observations, patient history, and epidemiological information. If result is POSITIVE SARS-CoV-2 target nucleic acids are DETECTED. The SARS-CoV-2 RNA is generally  detectable in upper and lower  respiratory specimens dur ing the acute phase of infection.  Positive  results are indicative of active infection with SARS-CoV-2.  Clinical  correlation with patient history and other diagnostic information is  necessary to determine patient infection status.  Positive results do  not rule out bacterial infection or co-infection with other viruses. If result is PRESUMPTIVE POSTIVE SARS-CoV-2 nucleic acids MAY BE PRESENT.   A presumptive positive result was obtained on the submitted specimen  and confirmed on repeat testing.  While 2019 novel coronavirus  (SARS-CoV-2) nucleic acids may be present in the submitted sample  additional confirmatory testing may be necessary for epidemiological  and / or clinical management purposes  to differentiate between  SARS-CoV-2 and other Sarbecovirus currently known to infect humans.  If clinically indicated additional testing with an alternate test  methodology [phone]) is advised. The SARS-CoV-2 RNA is generally  detectable in upper and lower respiratory sp ecimens during the acute  phase of infection. The expected result is Negative. Fact Sheet for Patients:  StrictlyIdeas.no Fact Sheet for Healthcare Providers: BankingDealers.co.za This test is not yet approved or cleared by the [place] FDA and has been authorized for detection and/or diagnosis of SARS-CoV-2 by FDA under an Emergency Use Authorization (EUA).  This EUA will remain in effect (meaning this test can be used) for the duration of the COVID-19 declaration under Section 564(b)(1) of the Act, 21 U.S.C. section 360bbb-3(b)(1), unless the authorization is terminated or revoked sooner. Performed at [facility], [place] [address], [place], [place] [address]   CBC with Differential     Status: Abnormal   Collection Time: 07/23/18  6:12 PM  Result Value Ref Range   WBC 12.4 (H) 4.0 - 10.5  K/uL   RBC 5.07 4.22 - 5.81 MIL/uL   Hemoglobin 15.3 13.0 - 17.0 g/dL   HCT 46.4 39.0 - 52.0 %   MCV 91.5 80.0 - 100.0 fL   MCH 30.2 26.0 - 34.0 pg   MCHC 33.0 30.0 - 36.0 g/dL   RDW 13.2 11.5 - 15.5 %   Platelets 189 150 - 400 K/uL   nRBC 0.0 0.0 - 0.2 %   Neutrophils Relative % 69 %   Neutro Abs 8.8 (H) 1.7 - 7.7 K/uL   Lymphocytes Relative 16 %   Lymphs Abs 2.0 0.7 - 4.0 K/uL   Monocytes Relative 9 %   Monocytes Absolute 1.1 (H) 0.1 - 1.0 K/uL   Eosinophils Relative 4 %   Eosinophils Absolute 0.5 0.0 - 0.5 K/uL   Basophils Relative 1 %   Basophils Absolute 0.1 0.0 - 0.1 K/uL   Immature Granulocytes 1 %   Abs Immature Granulocytes 0.09 (H) 0.00 - 0.07 K/uL  Comment: Performed at [facility], [place] [address] [place], [place] [address]  Comprehensive metabolic panel     Status: Abnormal   Collection Time: 07/23/18  6:12 PM  Result Value Ref Range   Sodium 141 135 - 145 mmol/L   Potassium 4.0 3.5 - 5.1 mmol/L   Chloride 103 98 - 111 mmol/L   CO2 26 22 - 32 mmol/L   Glucose, Bld 94 70 - 99 mg/dL   BUN 27 (H) 6 - 20 mg/dL   Creatinine, Ser 1.15 0.61 - 1.24 mg/dL   Calcium 9.5 8.9 - 10.3 mg/dL   Total Protein 7.3 6.5 - 8.1 g/dL   Albumin 4.6 3.5 - 5.0 g/dL   AST 17 15 - 41 U/L   ALT 33 0 - 44 U/L   Alkaline Phosphatase 66 38 - 126 U/L   Total Bilirubin 0.6 0.3 - 1.2 mg/dL   GFR calc non Af Amer >60 >60 mL/min   GFR calc Af Amer >60 >60 mL/min   Anion gap 12 5 - 15    Comment: Performed at [facility], [place] [address], [place], [place] [address]  Lipase, blood     Status: None   Collection Time: 07/23/18  6:12 PM  Result Value Ref Range   Lipase 28 11 - 51 U/L    Comment: Performed at [facility], [place] [address] [place], [place] [address]  I-stat chem 8, ED (not at [facility] or [facility])     Status: Abnormal   Collection Time: 07/23/18  6:21 PM  Result Value Ref Range   Sodium 140 135 - 145 mmol/L   Potassium 4.0 3.5  - 5.1 mmol/L   Chloride 104 98 - 111 mmol/L   BUN 27 (H) 6 - 20 mg/dL   Creatinine, Ser 1.20 0.61 - 1.24 mg/dL   Glucose, Bld 90 70 - 99 mg/dL   Calcium, Ion 1.14 (L) 1.15 - 1.40 mmol/L   TCO2 26 22 - 32 mmol/L   Hemoglobin 15.3 13.0 - 17.0 g/dL   HCT 45.0 39.0 - 52.0 %  Lactic acid, plasma     Status: None   Collection Time: 07/23/18  6:51 PM  Result Value Ref Range   Lactic Acid, Venous 0.8 0.5 - 1.9 mmol/L    Comment: Performed at [facility], [place] [address], [place],  [address]  CBC WITH DIFFERENTIAL     Status: Abnormal   Collection Time: 07/24/18  5:46 AM  Result Value Ref Range   WBC 10.6 (H) 4.0 - 10.5 K/uL   RBC 4.57 4.22 - 5.81 MIL/uL   Hemoglobin 13.9 13.0 - 17.0 g/dL   HCT 42.2 39.0 - 52.0 %   MCV 92.3 80.0 - 100.0 fL   MCH 30.4 26.0 - 34.0 pg   MCHC 32.9 30.0 - 36.0 g/dL   RDW 13.3 11.5 - 15.5 %   Platelets 157 150 - 400 K/uL   nRBC 0.0 0.0 - 0.2 %   Neutrophils Relative % 69 %   Neutro Abs 7.4 1.7 - 7.7 K/uL   Lymphocytes Relative 15 %   Lymphs Abs 1.6 0.7 - 4.0 K/uL   Monocytes Relative 10 %   Monocytes Absolute 1.0 0.1 - 1.0 K/uL   Eosinophils Relative 4 %   Eosinophils Absolute 0.4 0.0 - 0.5 K/uL   Basophils Relative 1 %   Basophils Absolute 0.1 0.0 - 0.1 K/uL   Immature Granulocytes 1 %   Abs  Immature Granulocytes 0.08 (H) 0.00 - 0.07 K/uL    Comment: Performed at [facility], [place] [address] [place], [place] [address]  Protime-INR     Status: None   Collection Time: 07/24/18  5:46 AM  Result Value Ref Range   Prothrombin Time 13.1 11.4 - 15.2 seconds   INR 1.0 0.8 - 1.2    Comment: (NOTE) INR goal varies based on device and disease states. Performed at [facility], [place] [address] [place], [place] [address]   APTT     Status: None   Collection Time: 07/24/18  5:46 AM  Result Value Ref Range   aPTT 26 24 - 36 seconds    Comment: Performed at [facility], [place] [address] [place], [place] [address]    Ct Abdomen Pelvis W Contrast  Result Date: 07/23/2018 CLINICAL DATA:  58 year old male with possible strangulated umbilical hernia. EXAM: CT ABDOMEN AND PELVIS WITH CONTRAST TECHNIQUE: Multidetector CT imaging of the abdomen and pelvis was performed using the standard protocol following bolus administration of intravenous contrast. CONTRAST:  118mL OMNIPAQUE IOHEXOL 300 MG/ML  SOLN COMPARISON:  CT of the abdomen pelvis dated 10/10/2013 FINDINGS: Lower chest: Minimal bibasilar dependent atelectatic changes. The visualized lung bases are otherwise clear. No intra-abdominal free air or free fluid. Hepatobiliary: No focal liver abnormality is seen. No gallstones, gallbladder wall thickening, or biliary dilatation. Pancreas: Unremarkable. No pancreatic ductal dilatation or surrounding inflammatory changes. Spleen: Normal in size without focal abnormality. Adrenals/Urinary Tract: Adrenal glands are unremarkable. Kidneys are normal, without renal calculi, focal lesion, or hydronephrosis. Bladder is unremarkable. Stomach/Bowel: There are scattered colonic diverticula without active inflammatory changes. There is no bowel obstruction or active inflammation. Appendectomy. Vascular/Lymphatic: Mild aortoiliac atherosclerotic disease. No portal venous gas. There is no adenopathy. Reproductive: The prostate and seminal vesicles are grossly unremarkable. No pelvic mass. Other: There is a small fat containing umbilical hernia. The neck of the hernia measures 2.5 cm in diameter. There is inflammatory changes of the herniated fat and surrounding subcutaneous soft tissues suggestive of strangulation or incarceration. Clinical correlation is recommended. No drainable fluid collection identified. There is no herniation of bowel. Musculoskeletal: No acute or significant osseous findings. IMPRESSION: 1. Small fat containing umbilical hernia with inflammatory changes suggestive of  strangulation or incarceration. Clinical correlation is recommended. No fluid collection. 2. Colonic diverticulosis. 3. Five aortic Atherosclerosis (ICD10-I70.0). Electronically Signed   By: Anner Crete M.D.   On: 07/23/2018 21:13    Review of Systems  Constitutional: Negative for chills and fever.  Respiratory: Negative for cough and shortness of breath.   Cardiovascular: Negative for chest pain.  Gastrointestinal: Positive for abdominal pain. Negative for nausea and vomiting.  All other systems reviewed and are negative.  Blood pressure 103/80, pulse 61, temperature 98.9 F (37.2 C), temperature source Oral, resp. rate 19, height 5\' 10"  (1.778 m), weight 97.5 kg, SpO2 98 %. Physical Exam  Constitutional: He is oriented to person, place, and time. He appears well-developed and well-nourished. No distress.  HENT:  Head: Normocephalic and atraumatic.  Right Ear: External ear normal.  Left Ear: External ear normal.  Nose: Nose normal.  Eyes: Pupils are equal, round, and reactive to light.  Neck: Normal range of motion. Neck supple. No tracheal deviation present.  Cardiovascular: Normal rate, regular rhythm, normal heart sounds and intact distal pulses.  Respiratory: Effort normal and breath sounds normal. No respiratory distress.  GI: Soft. He exhibits no distension. There is abdominal  tenderness.  There is an incarcerated small umbilical hernia which is tender and surrounding cellulitis  Musculoskeletal: Normal range of motion.        General: No deformity or edema.  Neurological: He is alert and oriented to person, place, and time.  Skin: Skin is warm and dry. He is not diaphoretic. There is erythema.  Psychiatric: His behavior is normal. Judgment normal.    Assessment/Plan: Incarcerated umbilical hernia  Again, the hernia contains incarcerated omentum.  There is surrounding cellulitis.  This will need operative repair either today or tomorrow.  I discussed the diagnosis with  him in detail.  There is no evidence the bowel is involved and he has no bowel obstruction. I will discuss the case with our acute care surgeon this morning.  Coralie Keens 07/24/2018, 6:19 AM

## 2018-07-24 NOTE — ED Notes (Signed)
Pt moved to private room and into a hospital bed for comfort.

## 2018-07-24 NOTE — Consult Note (Signed)
Reason for Consult:umbilical hernia Referring Physician: Dr. Tennis Must  Todd Harris is an 58 y.o. male.  HPI: This is a 58 year old gentleman with a known umbilical hernia.  He reports he has had it for many years.  It sounds like it is always been chronically incarcerated.  He started developing erythema and pain around the umbilicus 2 days ago so presented to the emergency room.  He underwent a CT scan of the abdomen and pelvis showing a small umbilical hernia containing incarcerated fat which appeared inflamed as well.  He has no obstructive symptoms and is otherwise without complaints.  He has multiple comorbidities including bipolar disorder, history of CVA, and sleep apnea.  Past Medical History:  Diagnosis Date  . Anxiety   . Atrial arrhythmia   . Barrett esophagus   . Bipolar 1 disorder ([name])   . Cavernous angioma   . Depression   . History of chicken pox   . Hypertension   . Seizures ([place])    1 in 2005, 1 in 2016   . Sleep apnea   . Sleep apnea   . Stroke ([place]) 11/2014   No residual deficits  . Venous malformation    Brain    Past Surgical History:  Procedure Laterality Date  . APPENDECTOMY  1970   age 63  . bone spur     age 26  . kidney stones    . RAD ONC MR BRAIN GAMMA KNIFE  12/05/2016  . TONSILLECTOMY  1968    Family History  Problem Relation Age of Onset  . Aneurysm Maternal Grandmother   . Asthma Mother   . Diabetes Mother   . Hypertension Mother   . Diabetes Brother   . Diabetes Brother   . Pancreatic cancer Father   . Diabetes Father   . Cancer Father        Pancreatic  . Diabetes Paternal Grandmother   . Diabetes Paternal Grandfather   . Colon cancer Neg Hx     Social History:  reports that he has never smoked. He has never used smokeless tobacco. He reports current alcohol use of about 4.0 standard drinks of alcohol per week. He reports that he does not use drugs.  Allergies: No Known Allergies  Medications: I have reviewed the  patient's current medications.  Results for orders placed or performed during the hospital encounter of 07/23/18 (from the past 48 hour(s))  Urinalysis, Routine w reflex microscopic     Status: None   Collection Time: 07/23/18  5:52 PM  Result Value Ref Range   Color, Urine YELLOW YELLOW   APPearance CLEAR CLEAR   Specific Gravity, Urine 1.024 1.005 - 1.030   pH 6.0 5.0 - 8.0   Glucose, UA NEGATIVE NEGATIVE mg/dL   Hgb urine dipstick NEGATIVE NEGATIVE   Bilirubin Urine NEGATIVE NEGATIVE   Ketones, ur NEGATIVE NEGATIVE mg/dL   Protein, ur NEGATIVE NEGATIVE mg/dL   Nitrite NEGATIVE NEGATIVE   Leukocytes,Ua NEGATIVE NEGATIVE    Comment: Performed at [facility] [address], [place], [place] [address]  SARS Coronavirus 2 (CEPHEID - Performed in [facility] hospital lab), Hosp Order     Status: None   Collection Time: 07/23/18  6:04 PM   Specimen: Nasopharyngeal Swab  Result Value Ref Range   SARS Coronavirus 2 NEGATIVE NEGATIVE    Comment: (NOTE) If result is NEGATIVE SARS-CoV-2 target nucleic acids are NOT DETECTED. The SARS-CoV-2 RNA is generally detectable in upper and lower  respiratory specimens during the acute phase of infection. The lowest  concentration of SARS-CoV-2 viral copies this assay can detect is 250  copies / mL. A negative result does not preclude SARS-CoV-2 infection  and should not be used as the sole basis for treatment or other  patient management decisions.  A negative result may occur with  improper specimen collection / handling, submission of specimen other  than nasopharyngeal swab, presence of viral mutation(s) within the  areas targeted by this assay, and inadequate number of viral copies  (<250 copies / mL). A negative result must be combined with clinical  observations, patient history, and epidemiological information. If result is POSITIVE SARS-CoV-2 target nucleic acids are DETECTED. The SARS-CoV-2 RNA is generally  detectable in upper and lower  respiratory specimens dur ing the acute phase of infection.  Positive  results are indicative of active infection with SARS-CoV-2.  Clinical  correlation with patient history and other diagnostic information is  necessary to determine patient infection status.  Positive results do  not rule out bacterial infection or co-infection with other viruses. If result is PRESUMPTIVE POSTIVE SARS-CoV-2 nucleic acids MAY BE PRESENT.   A presumptive positive result was obtained on the submitted specimen  and confirmed on repeat testing.  While 2019 novel coronavirus  (SARS-CoV-2) nucleic acids may be present in the submitted sample  additional confirmatory testing may be necessary for epidemiological  and / or clinical management purposes  to differentiate between  SARS-CoV-2 and other Sarbecovirus currently known to infect humans.  If clinically indicated additional testing with an alternate test  methodology [phone]) is advised. The SARS-CoV-2 RNA is generally  detectable in upper and lower respiratory sp ecimens during the acute  phase of infection. The expected result is Negative. Fact Sheet for Patients:  StrictlyIdeas.no Fact Sheet for Healthcare Providers: BankingDealers.co.za This test is not yet approved or cleared by the [place] FDA and has been authorized for detection and/or diagnosis of SARS-CoV-2 by FDA under an Emergency Use Authorization (EUA).  This EUA will remain in effect (meaning this test can be used) for the duration of the COVID-19 declaration under Section 564(b)(1) of the Act, 21 U.S.C. section 360bbb-3(b)(1), unless the authorization is terminated or revoked sooner. Performed at [facility], [place] [address], [place], [place] [address]   CBC with Differential     Status: Abnormal   Collection Time: 07/23/18  6:12 PM  Result Value Ref Range   WBC 12.4 (H) 4.0 - 10.5  K/uL   RBC 5.07 4.22 - 5.81 MIL/uL   Hemoglobin 15.3 13.0 - 17.0 g/dL   HCT 46.4 39.0 - 52.0 %   MCV 91.5 80.0 - 100.0 fL   MCH 30.2 26.0 - 34.0 pg   MCHC 33.0 30.0 - 36.0 g/dL   RDW 13.2 11.5 - 15.5 %   Platelets 189 150 - 400 K/uL   nRBC 0.0 0.0 - 0.2 %   Neutrophils Relative % 69 %   Neutro Abs 8.8 (H) 1.7 - 7.7 K/uL   Lymphocytes Relative 16 %   Lymphs Abs 2.0 0.7 - 4.0 K/uL   Monocytes Relative 9 %   Monocytes Absolute 1.1 (H) 0.1 - 1.0 K/uL   Eosinophils Relative 4 %   Eosinophils Absolute 0.5 0.0 - 0.5 K/uL   Basophils Relative 1 %   Basophils Absolute 0.1 0.0 - 0.1 K/uL   Immature Granulocytes 1 %   Abs Immature Granulocytes 0.09 (H) 0.00 - 0.07 K/uL  Comment: Performed at [facility], [place] [address], [place], [place] [address]  Comprehensive metabolic panel     Status: Abnormal   Collection Time: 07/23/18  6:12 PM  Result Value Ref Range   Sodium 141 135 - 145 mmol/L   Potassium 4.0 3.5 - 5.1 mmol/L   Chloride 103 98 - 111 mmol/L   CO2 26 22 - 32 mmol/L   Glucose, Bld 94 70 - 99 mg/dL   BUN 27 (H) 6 - 20 mg/dL   Creatinine, Ser 1.15 0.61 - 1.24 mg/dL   Calcium 9.5 8.9 - 10.3 mg/dL   Total Protein 7.3 6.5 - 8.1 g/dL   Albumin 4.6 3.5 - 5.0 g/dL   AST 17 15 - 41 U/L   ALT 33 0 - 44 U/L   Alkaline Phosphatase 66 38 - 126 U/L   Total Bilirubin 0.6 0.3 - 1.2 mg/dL   GFR calc non Af Amer >60 >60 mL/min   GFR calc Af Amer >60 >60 mL/min   Anion gap 12 5 - 15    Comment: Performed at [facility], [place] [address], [place], [place] [address]  Lipase, blood     Status: None   Collection Time: 07/23/18  6:12 PM  Result Value Ref Range   Lipase 28 11 - 51 U/L    Comment: Performed at [facility], [place] [address] [place], [place] [address]  I-stat chem 8, ED (not at [facility] or [facility])     Status: Abnormal   Collection Time: 07/23/18  6:21 PM  Result Value Ref Range   Sodium 140 135 - 145 mmol/L   Potassium 4.0 3.5  - 5.1 mmol/L   Chloride 104 98 - 111 mmol/L   BUN 27 (H) 6 - 20 mg/dL   Creatinine, Ser 1.20 0.61 - 1.24 mg/dL   Glucose, Bld 90 70 - 99 mg/dL   Calcium, Ion 1.14 (L) 1.15 - 1.40 mmol/L   TCO2 26 22 - 32 mmol/L   Hemoglobin 15.3 13.0 - 17.0 g/dL   HCT 45.0 39.0 - 52.0 %  Lactic acid, plasma     Status: None   Collection Time: 07/23/18  6:51 PM  Result Value Ref Range   Lactic Acid, Venous 0.8 0.5 - 1.9 mmol/L    Comment: Performed at [facility], [place] [address] [place], [place] [address]  CBC WITH DIFFERENTIAL     Status: Abnormal   Collection Time: 07/24/18  5:46 AM  Result Value Ref Range   WBC 10.6 (H) 4.0 - 10.5 K/uL   RBC 4.57 4.22 - 5.81 MIL/uL   Hemoglobin 13.9 13.0 - 17.0 g/dL   HCT 42.2 39.0 - 52.0 %   MCV 92.3 80.0 - 100.0 fL   MCH 30.4 26.0 - 34.0 pg   MCHC 32.9 30.0 - 36.0 g/dL   RDW 13.3 11.5 - 15.5 %   Platelets 157 150 - 400 K/uL   nRBC 0.0 0.0 - 0.2 %   Neutrophils Relative % 69 %   Neutro Abs 7.4 1.7 - 7.7 K/uL   Lymphocytes Relative 15 %   Lymphs Abs 1.6 0.7 - 4.0 K/uL   Monocytes Relative 10 %   Monocytes Absolute 1.0 0.1 - 1.0 K/uL   Eosinophils Relative 4 %   Eosinophils Absolute 0.4 0.0 - 0.5 K/uL   Basophils Relative 1 %   Basophils Absolute 0.1 0.0 - 0.1 K/uL   Immature Granulocytes 1 %   Abs  Immature Granulocytes 0.08 (H) 0.00 - 0.07 K/uL    Comment: Performed at [facility], [place] [address] [place], [place] [address]  Protime-INR     Status: None   Collection Time: 07/24/18  5:46 AM  Result Value Ref Range   Prothrombin Time 13.1 11.4 - 15.2 seconds   INR 1.0 0.8 - 1.2    Comment: (NOTE) INR goal varies based on device and disease states. Performed at [facility], [place] [address], [place], [place] [address]   APTT     Status: None   Collection Time: 07/24/18  5:46 AM  Result Value Ref Range   aPTT 26 24 - 36 seconds    Comment: Performed at [facility], [place] [address] [place], [place] [address]    Ct Abdomen Pelvis W Contrast  Result Date: 07/23/2018 CLINICAL DATA:  58 year old male with possible strangulated umbilical hernia. EXAM: CT ABDOMEN AND PELVIS WITH CONTRAST TECHNIQUE: Multidetector CT imaging of the abdomen and pelvis was performed using the standard protocol following bolus administration of intravenous contrast. CONTRAST:  195mL OMNIPAQUE IOHEXOL 300 MG/ML  SOLN COMPARISON:  CT of the abdomen pelvis dated 10/10/2013 FINDINGS: Lower chest: Minimal bibasilar dependent atelectatic changes. The visualized lung bases are otherwise clear. No intra-abdominal free air or free fluid. Hepatobiliary: No focal liver abnormality is seen. No gallstones, gallbladder wall thickening, or biliary dilatation. Pancreas: Unremarkable. No pancreatic ductal dilatation or surrounding inflammatory changes. Spleen: Normal in size without focal abnormality. Adrenals/Urinary Tract: Adrenal glands are unremarkable. Kidneys are normal, without renal calculi, focal lesion, or hydronephrosis. Bladder is unremarkable. Stomach/Bowel: There are scattered colonic diverticula without active inflammatory changes. There is no bowel obstruction or active inflammation. Appendectomy. Vascular/Lymphatic: Mild aortoiliac atherosclerotic disease. No portal venous gas. There is no adenopathy. Reproductive: The prostate and seminal vesicles are grossly unremarkable. No pelvic mass. Other: There is a small fat containing umbilical hernia. The neck of the hernia measures 2.5 cm in diameter. There is inflammatory changes of the herniated fat and surrounding subcutaneous soft tissues suggestive of strangulation or incarceration. Clinical correlation is recommended. No drainable fluid collection identified. There is no herniation of bowel. Musculoskeletal: No acute or significant osseous findings. IMPRESSION: 1. Small fat containing umbilical hernia with inflammatory changes suggestive of  strangulation or incarceration. Clinical correlation is recommended. No fluid collection. 2. Colonic diverticulosis. 3. Five aortic Atherosclerosis (ICD10-I70.0). Electronically Signed   By: Anner Crete M.D.   On: 07/23/2018 21:13    Review of Systems  Constitutional: Negative for chills and fever.  Respiratory: Negative for cough and shortness of breath.   Cardiovascular: Negative for chest pain.  Gastrointestinal: Positive for abdominal pain. Negative for nausea and vomiting.  All other systems reviewed and are negative.  Blood pressure 103/80, pulse 61, temperature 98.9 F (37.2 C), temperature source Oral, resp. rate 19, height 5\' 10"  (1.778 m), weight 97.5 kg, SpO2 98 %. Physical Exam  Constitutional: He is oriented to person, place, and time. He appears well-developed and well-nourished. No distress.  HENT:  Head: Normocephalic and atraumatic.  Right Ear: External ear normal.  Left Ear: External ear normal.  Nose: Nose normal.  Eyes: Pupils are equal, round, and reactive to light.  Neck: Normal range of motion. Neck supple. No tracheal deviation present.  Cardiovascular: Normal rate, regular rhythm, normal heart sounds and intact distal pulses.  Respiratory: Effort normal and breath sounds normal. No respiratory distress.  GI: Soft. He exhibits no distension. There is abdominal  tenderness.  There is an incarcerated small umbilical hernia which is tender and surrounding cellulitis  Musculoskeletal: Normal range of motion.        General: No deformity or edema.  Neurological: He is alert and oriented to person, place, and time.  Skin: Skin is warm and dry. He is not diaphoretic. There is erythema.  Psychiatric: His behavior is normal. Judgment normal.    Assessment/Plan: Incarcerated umbilical hernia  Again, the hernia contains incarcerated omentum.  There is surrounding cellulitis.  This will need operative repair either today or tomorrow.  I discussed the diagnosis with  him in detail.  There is no evidence the bowel is involved and he has no bowel obstruction. I will discuss the case with our acute care surgeon this morning.  Coralie Keens 07/24/2018, 6:19 AM

## 2018-07-24 NOTE — ED Notes (Addendum)
Pt described pain as "manageable" at this time, this RN requested that the pt please inform me before pain became unmanageable so that staff are able to keep in under control and the pt comfortable.  Pt verbalized understanding. Will continue to monitor.

## 2018-07-24 NOTE — Anesthesia Procedure Notes (Signed)
Procedure Name: Intubation Date/Time: 07/24/2018 2:07 PM Performed by: Garrel Ridgel, CRNA Pre-anesthesia Checklist: Emergency Drugs available, Suction available, Patient being monitored, Timeout performed and Patient identified Patient Re-evaluated:Patient Re-evaluated prior to induction Oxygen Delivery Method: Circle system utilized Preoxygenation: Pre-oxygenation with 100% oxygen Induction Type: IV induction Ventilation: Mask ventilation without difficulty Laryngoscope Size: Mac and 4 Grade View: Grade II Tube size: 7.5 mm Number of attempts: 1 Airway Equipment and Method: Stylet Placement Confirmation: ETT inserted through vocal cords under direct vision and breath sounds checked- equal and bilateral Secured at: 22 cm Tube secured with: Tape Dental Injury: Teeth and Oropharynx as per pre-operative assessment

## 2018-07-24 NOTE — Progress Notes (Signed)
PROGRESS NOTE  Todd Harris:[id number] DOB: 08-15-1960 DOA: 07/23/2018 PCP: Brunetta Jeans, PA-C  Brief History   Todd Harris is a 58 y.o. male with medical history significant of anxiety, atrial arrhythmia, Barrett's esophagus, bipolar 1 disorder, cavernous angioma, history of varicella-zoster, hypertension, history of pontine stroke, seizures, sleep apnea on CPAP who is coming to the emergency department with complaints of abdominal pain in his umbilical and periumbilical area, where he has had an umbilical hernia for the past 10 years, the patient states that normally he is able to push the tissue back and then he wears support for a day or 2 until the symptoms resolve.  However, this episode has been the most painful ever.  He denies nausea or vomiting, fever, constipation, diarrhea, melena or hematochezia.  No dysuria, frequency or hematuria.  He denies chest pain, dyspnea, palpitations, diaphoresis, PND, orthopnea, but occasionally gets lower extremity edema.  ED Course: Initial vital signs were Temperature 98.9 F, pulse 79, respirations 17, blood pressure 132/88 mmHg and O2 sat 98% on room air.  The patient received IV fluids and morphine in the emergency department.  Dr. Ninfa Linden from general surgery was contacted and will be evaluating the patient in the morning.  Urinalysis was normal.  CBC showed a white count of 12.4, with 69% neutrophils.  Hemoglobin 15.3 g/dL and platelets 289.  CMP shows a BUN of 27 mg/dL, all other values are within normal limits.  Lactic acid was 0.8.  SARS coronavirus 2 swab was negative.  Imaging: CT abdomen pelvis with contrast showed small fat-containing umbilical hernia with inflammatory changes suggestive with strangulation or consideration.  General surgery was consulted. Dr. Barry Dienes took the patient to the OR this morning for reduction of hermia with omental incarceration. The patient tolerated the procedure well.  Consultants  . General surgery.   Procedures  . Operative repair of incarcerated hernia.  Antibiotics   Anti-infectives (From admission, onward)   Start     Dose/Rate Route Frequency Ordered Stop   07/24/18 2200  ceFAZolin (ANCEF) IVPB 1 g/50 mL premix     1 g 100 mL/hr over 30 Minutes Intravenous Every 8 hours 07/24/18 1641     07/24/18 1423  ceFAZolin (ANCEF) 2-4 GM/100ML-% IVPB    Note to Pharmacy: Georgena Spurling   : cabinet override      07/24/18 1423 07/25/18 0229    .  Marland Kitchen   Subjective  The patient is seen post-operatively. He is quite somnolent. No new complaints.  Objective   Vitals:  Vitals:   07/24/18 1954 07/24/18 2042  BP: 106/69   Pulse: 78 74  Resp: 18 14  Temp: 97.6 F (36.4 C)   SpO2: 95% 96%    Exam:  Constitutional:  . Pt somnolent post operatively. No acute distress. Respiratory:  . CTA bilaterally, no w/r/r.  . Respiratory effort normal. No retractions or accessory muscle use Cardiovascular:  . RRR, no m/r/g . No LE extremity edema   . Normal pedal pulses Abdomen:  . Abdomen is slightly distended. Hypoactive bowel sounds. Bandages appear clean and dry.  Musculoskeletal:  . No cyanosis, clubbing or edema. Skin:  . No rashes, lesions, ulcers . palpation of skin: no induration or nodules   I have personally reviewed the following:   Today's Data  . CBC, CMP,. Vitals,  Imaging  . CT abdomen.  Scheduled Meds: . acetaminophen  1,000 mg Oral Q8H  . atorvastatin  20 mg Oral Daily  . buPROPion  150 mg Oral Daily  . cariprazine  1.5 mg Oral Daily  . diazepam  5 mg Oral Daily  . [START ON 07/25/2018] enoxaparin (LOVENOX) injection  40 mg Subcutaneous Daily  . lisinopril  20 mg Oral Daily   And  . hydrochlorothiazide  25 mg Oral Daily  . lamoTRIgine  100 mg Oral BID   Continuous Infusions: . sodium chloride 100 mL/hr at 07/24/18 1814  . ceFAZolin    .  ceFAZolin (ANCEF) IV    . lactated ringers 50 mL/hr at 07/24/18 1239    Principal Problem:   Incarcerated umbilical  hernia Active Problems:   HTN (hypertension)   Seizures (HCC)   OSA on CPAP   GAD (generalized anxiety disorder)   Incarcerated hernia   LOS: 0 days    A & P  Incarcerated umbilical hernia: S/P operaative repair. Patient tolerated the procedure well. As per general surgery.  HTN (hypertension): Bl;ood pressure well controlled on home doses of lisinopril and HCTZ. Monitor.  Seizure Disorder [facility]): Continue Lamictal 100 mg p.o. twice daily. Seizure precautions.  OSA on CPAP: Continue CPAP at bedtime.  GAD (generalized anxiety disorder): Continue Wellbutrin 150 mg p.o. daily and Valium 5 mg PO daily.  DVT prophylaxis: SCDs. Code Status: Full code. Family Communication:  None available. Disposition Plan: Observation for pain control and general evaluation in a.m.   Bo Rogue, DO [facility] Direct contact: see [email or web address]  7PM-7AM contact night coverage as above 07/24/2018, 9:35 PM  LOS: 0 days

## 2018-07-24 NOTE — Op Note (Signed)
PRE-OPERATIVE DIAGNOSIS: incarcerated umbilical hernia with associated cellulitis  POST-OPERATIVE DIAGNOSIS:  Strangulated umbilical hernia   PROCEDURE:  Procedure(s): Primary repair of strangulated umbilical hernia.    SURGEON:  Surgeon(s): Stark Klein, MD  ASSIST:  Modena Jansky, PA-C  ANESTHESIA:   local and general  DRAINS: none   LOCAL MEDICATIONS USED:  BUPIVICAINE  and LIDOCAINE   SPECIMEN:  Source of Specimen:  hernia sac  DISPOSITION OF SPECIMEN:  PATHOLOGY  COUNTS:  YES  DICTATION: .Dragon Dictation  PLAN OF CARE: Admit to inpatient   PATIENT DISPOSITION:  PACU - hemodynamically stable.  FINDINGS:  Strangulated necrotic omentum in hernia sac  EBL: min  PROCEDURE:  Patient was identified in the holding area and taken to the operating room where he was placed supine on the operating room table.  General endotracheal anesthesia was induced.  The patient's abdomen was prepped and draped in sterile fashion.  A timeout was performed according to the surgical safety checklist.  When all was correct, we continued.  A curvilinear transverse incision was made below the umbilicus.  The subcutaneous tissues were divided with the cautery.  The hernia sac was identified and bluntly dissected circumferentially.  The umbilical skin was elevated and taken sharply off the top of the hernia sac.  The hernia sac was opened.  There was necrotic omentum that would not manually reduce in the hernia sac.  This was removed with the cautery.    The hernia sac was closed with a pursestring 2-0 Vicryl suture.  The excess hernia sac was removed with the cautery.  This was dissected away from the umbilical fascia circumferentially.  Due to the presence of cellulitis over the hernia, mesh was not used in the repair.  The fascia was closed using 0 Vicryl sutures.  The skin was irrigated.  The umbilical skin was tacked back down to the fascia to re-create the appearance of the umbilicus.  The  skin was then reapproximated using 3-0 Vicryl interrupted deep dermal sutures and 4 Monocryl running subcuticular sutures.  The wound was then cleaned, dried, and dressed with benzoin, Steri-Strips, gauze, and Tegaderm.  The patient was allowed to emerge from anesthesia and taken to the PACU in stable condition.  Needle, sponge, and instrument counts were correct x2.

## 2018-07-25 ENCOUNTER — Encounter (HOSPITAL_COMMUNITY): Payer: Self-pay | Admitting: General Surgery

## 2018-07-25 LAB — BASIC METABOLIC PANEL
Anion gap: 10 (ref 5–15)
BUN: 23 mg/dL — ABNORMAL HIGH (ref 6–20)
CO2: 25 mmol/L (ref 22–32)
Calcium: 9 mg/dL (ref 8.9–10.3)
Chloride: 104 mmol/L (ref 98–111)
Creatinine, Ser: 1.01 mg/dL (ref 0.61–1.24)
GFR calc Af Amer: >60 mL/min (ref >60)
GFR calc non Af Amer: >60 mL/min (ref >60)
Glucose, Bld: 162 mg/dL — ABNORMAL HIGH (ref 70–99)
Potassium: 4.6 mmol/L (ref 3.5–5.1)
Sodium: 139 mmol/L (ref 135–145)

## 2018-07-25 MED ORDER — ACETAMINOPHEN 500 MG PO TABS: 1000.0000 mg | ORAL_TABLET | Freq: Four times a day (QID) | ORAL | Status: DC

## 2018-07-25 MED ORDER — ACETAMINOPHEN 500 MG PO TABS: ORAL_TABLET | ORAL | 0 refills | Status: DC

## 2018-07-25 MED ORDER — OXYCODONE HCL 5 MG PO TABS: 5.0000 mg | ORAL_TABLET | Freq: Four times a day (QID) | ORAL | 0 refills | Status: DC | PRN

## 2018-07-25 MED ORDER — CEPHALEXIN 500 MG PO CAPS: 500.0000 mg | ORAL_CAPSULE | Freq: Four times a day (QID) | ORAL | 0 refills | Status: AC

## 2018-07-25 NOTE — Discharge Summary (Signed)
Physician Discharge Summary  Todd Harris CBU:[id number] DOB: 1960/06/19 DOA: 07/23/2018  PCP: Brunetta Jeans, PA-C  Admit date: 07/23/2018 Discharge date: 07/25/2018  Recommendations for Outpatient Follow-up:  1. Follow up with PCP in 7-10 days. 2. No driving today.  Follow-up Information    Stark Klein, MD Follow up on 08/12/2018.   Specialty: General Surgery Why: Your appointment is at 11:45 PM.  At the office 30 minutes early for check-in.  Bring photo ID and insurance information. Contact information: [address] [place] [address] [phone]          Discharge Diagnoses: Principal diagnosis is #1 1. Incarcerated Umbilical hernia. 2. Hypotension 3. Dehydration 4. History of chronic atrial fibrillation 5. Hyperlipidemia 6. Chronic back pain 7. Major depressive disorder  Discharge Condition: Good Disposition: Home  Diet recommendation: Heart healthy  Filed Weights   07/23/18 1742  Weight: 97.5 kg    History of present illness:  Todd Demarest Helmsis a 58 y.o.malewith medical history significant ofanxiety, atrial arrhythmia, Barrett's esophagus, bipolar 1 disorder, cavernous angioma, history of varicella-zoster, hypertension, history of pontine stroke, seizures, sleep apnea on CPAP who is coming to the emergency department with complaints of abdominal pain in his umbilical and periumbilical area, where he has hadan umbilical hernia for the past 10 years, the patient states that normally he is able to push the tissue back and then he wears support for a day or 2 until the symptoms resolve. However, this episode has been the most painful ever. He denies nausea or vomiting, fever, constipation, diarrhea, melena or hematochezia. No dysuria, frequency or hematuria. He denies chest pain, dyspnea, palpitations, diaphoresis, PND, orthopnea, but occasionally gets lower extremity edema.  ED Course:Initial vital signs wereTemperature 98.9 F, pulse 79,  respirations 17, blood pressure 132/88 mmHg and O2 sat 98% on room air. The patient received IV fluids and morphine in the emergency department. Dr. Ninfa Linden from general surgery was contacted and will be evaluating the patient in the morning.  Urinalysis was normal. CBC showed a white count of 12.4, with 69% neutrophils. Hemoglobin 15.3 g/dL and platelets 289. CMP shows a BUN of 27 mg/dL, all other values are within normal limits. Lactic acid was 0.8. SARS coronavirus 2 swab was negative.  Imaging:CT abdomen pelvis with contrast showed small fat-containing umbilical hernia with inflammatory changes suggestive with strangulation or consideration.  Hospital Course:  General surgery was consulted and the patient underwent repair of umbilical hernia on 4/88/8916. He has tolerated the procedure well. He has had a BM and is tolerating his diet. He is discharged to home in good condition.  Today's assessment: S: The patient is up and walking about his room. No new complaints. Wants to go home. O: Vitals:  Vitals:   07/25/18 0541 07/25/18 0931  BP: 100/66 113/70  Pulse: 64 77  Resp: 18 17  Temp: 98.1 F (36.7 C) 98 F (36.7 C)  SpO2: 96% 96%    Constitutional:   The patient is awake, alert, and oriented x 3. No acute distress. Respiratory:   No increased work of breathing.  No wheezes, rales, or rhonchi   No tactile fremitus. Cardiovascular:   Regular rate and rhythm.  No murmurs, ectopy, or gallups.  No lateral PMI. No thrills. Abdomen:   Abdomen soft, slightly tender, non-distended.  No hernias, masses, or organomegaly are appreciated.  Normoactive bowel sounds. Musculoskeletal:   No cyanossi, clubbing, or edema Skin:   No rashes, lesions, ulcers  palpation of skin:  no induration or nodules Neurologic:   CN 2-12 intact  Sensation all 4 extremities intact Psychiatric:   judgement and insight appear normal  Mental status o Mood, affect  appropriate o Orientation to person, place, time   Discharge Instructions  Discharge Instructions    Activity as tolerated - No restrictions   Complete by: As directed    Call MD for:  persistant nausea and vomiting   Complete by: As directed    Call MD for:  severe uncontrolled pain   Complete by: As directed    Call MD for:  temperature >100.4   Complete by: As directed    Diet - low sodium heart healthy   Complete by: As directed    Discharge instructions   Complete by: As directed    No driving today. Activity as tolerated.  Follow up with PCP in 7-10 days. Follow up with General Surgery as directed.   Increase activity slowly   Complete by: As directed      Allergies as of 07/25/2018   No Known Allergies     Medication List    TAKE these medications   acetaminophen 500 MG tablet Commonly known as: TYLENOL You can take 1000 mg every 6 hours.  I would schedule it for every 6 hours for the first 24-48 hours.  After that you can go to 1000 mg every 6 hours as needed.  You can buy this over-the-counter at any drugstore.  Do not take over 4000 mg it can harm your liver.   atorvastatin 20 MG tablet Commonly known as: LIPITOR TAKE 1 TABLET BY MOUTH EVERY DAY Notes to patient: 7/24   buPROPion 150 MG 24 hr tablet Commonly known as: WELLBUTRIN XL Take 1 tablet (150 mg total) by mouth daily. Notes to patient: 7/24   cariprazine capsule Commonly known as: Vraylar Take 1 capsule (1.5 mg total) by mouth daily. Notes to patient: 7/24   cephALEXin 500 MG capsule Commonly known as: KEFLEX Take 1 capsule (500 mg total) by mouth 4 (four) times daily for 7 days. Notes to patient: 7/23    diazepam 5 MG tablet Commonly known as: VALIUM Take 1 tablet (5 mg total) by mouth daily. Notes to patient: 7/24   lamoTRIgine 100 MG tablet Commonly known as: LAMICTAL Take 1 tablet (100 mg total) by mouth 2 (two) times daily. Notes to patient: 7/23   lisinopril-hydrochlorothiazide  20-25 MG tablet Commonly known as: ZESTORETIC TAKE 1 TABLET BY MOUTH EVERY DAY What changed:   how much to take  how to take this  when to take this  additional instructions Notes to patient: 7/24   meclizine 25 MG tablet Commonly known as: Medi-Meclizine Take 1 tablet (25 mg total) by mouth 3 (three) times daily as needed for dizziness.   oxyCODONE 5 MG immediate release tablet Commonly known as: Oxy IR/ROXICODONE Take 1 tablet (5 mg total) by mouth every 6 (six) hours as needed for severe pain or breakthrough pain (Pain not relieved by plain Tylenol.). Notes to patient: If needed after trying tylenol take next dose at 3:30pm   zaleplon 5 MG capsule Commonly known as: SONATA Take 1 capsule (5 mg total) by mouth at bedtime as needed. for sleep      No Known Allergies  The results of significant diagnostics from this hospitalization (including imaging, microbiology, ancillary and laboratory) are listed below for reference.    Significant Diagnostic Studies: Ct Abdomen Pelvis W Contrast  Result Date: 07/23/2018 CLINICAL DATA:  58 year old male with possible strangulated umbilical hernia. EXAM: CT ABDOMEN AND PELVIS WITH CONTRAST TECHNIQUE: Multidetector CT imaging of the abdomen and pelvis was performed using the standard protocol following bolus administration of intravenous contrast. CONTRAST:  167mL OMNIPAQUE IOHEXOL 300 MG/ML  SOLN COMPARISON:  CT of the abdomen pelvis dated 10/10/2013 FINDINGS: Lower chest: Minimal bibasilar dependent atelectatic changes. The visualized lung bases are otherwise clear. No intra-abdominal free air or free fluid. Hepatobiliary: No focal liver abnormality is seen. No gallstones, gallbladder wall thickening, or biliary dilatation. Pancreas: Unremarkable. No pancreatic ductal dilatation or surrounding inflammatory changes. Spleen: Normal in size without focal abnormality. Adrenals/Urinary Tract: Adrenal glands are unremarkable. Kidneys are normal,  without renal calculi, focal lesion, or hydronephrosis. Bladder is unremarkable. Stomach/Bowel: There are scattered colonic diverticula without active inflammatory changes. There is no bowel obstruction or active inflammation. Appendectomy. Vascular/Lymphatic: Mild aortoiliac atherosclerotic disease. No portal venous gas. There is no adenopathy. Reproductive: The prostate and seminal vesicles are grossly unremarkable. No pelvic mass. Other: There is a small fat containing umbilical hernia. The neck of the hernia measures 2.5 cm in diameter. There is inflammatory changes of the herniated fat and surrounding subcutaneous soft tissues suggestive of strangulation or incarceration. Clinical correlation is recommended. No drainable fluid collection identified. There is no herniation of bowel. Musculoskeletal: No acute or significant osseous findings. IMPRESSION: 1. Small fat containing umbilical hernia with inflammatory changes suggestive of strangulation or incarceration. Clinical correlation is recommended. No fluid collection. 2. Colonic diverticulosis. 3. Five aortic Atherosclerosis (ICD10-I70.0). Electronically Signed   By: Anner Crete M.D.   On: 07/23/2018 21:13    Microbiology: Recent Results (from the past 240 hour(s))  SARS Coronavirus 2 (CEPHEID - Performed in [facility] hospital lab), Hosp Order     Status: None   Collection Time: 07/23/18  6:04 PM   Specimen: Nasopharyngeal Swab  Result Value Ref Range Status   SARS Coronavirus 2 NEGATIVE NEGATIVE Final    Comment: (NOTE) If result is NEGATIVE SARS-CoV-2 target nucleic acids are NOT DETECTED. The SARS-CoV-2 RNA is generally detectable in upper and lower  respiratory specimens during the acute phase of infection. The lowest  concentration of SARS-CoV-2 viral copies this assay can detect is 250  copies / mL. A negative result does not preclude SARS-CoV-2 infection  and should not be used as the sole basis for treatment or other  patient  management decisions.  A negative result may occur with  improper specimen collection / handling, submission of specimen other  than nasopharyngeal swab, presence of viral mutation(s) within the  areas targeted by this assay, and inadequate number of viral copies  (<250 copies / mL). A negative result must be combined with clinical  observations, patient history, and epidemiological information. If result is POSITIVE SARS-CoV-2 target nucleic acids are DETECTED. The SARS-CoV-2 RNA is generally detectable in upper and lower  respiratory specimens dur ing the acute phase of infection.  Positive  results are indicative of active infection with SARS-CoV-2.  Clinical  correlation with patient history and other diagnostic information is  necessary to determine patient infection status.  Positive results do  not rule out bacterial infection or co-infection with other viruses. If result is PRESUMPTIVE POSTIVE SARS-CoV-2 nucleic acids MAY BE PRESENT.   A presumptive positive result was obtained on the submitted specimen  and confirmed on repeat testing.  While 2019 novel coronavirus  (SARS-CoV-2) nucleic acids may be present in the submitted sample  additional confirmatory testing may be necessary for epidemiological  and / or clinical management purposes  to differentiate between  SARS-CoV-2 and other Sarbecovirus currently known to infect humans.  If clinically indicated additional testing with an alternate test  methodology [phone]) is advised. The SARS-CoV-2 RNA is generally  detectable in upper and lower respiratory sp ecimens during the acute  phase of infection. The expected result is Negative. Fact Sheet for Patients:  StrictlyIdeas.no Fact Sheet for Healthcare Providers: BankingDealers.co.za This test is not yet approved or cleared by the [place] FDA and has been authorized for detection and/or diagnosis of SARS-CoV-2 by FDA under  an Emergency Use Authorization (EUA).  This EUA will remain in effect (meaning this test can be used) for the duration of the COVID-19 declaration under Section 564(b)(1) of the Act, 21 U.S.C. section 360bbb-3(b)(1), unless the authorization is terminated or revoked sooner. Performed at [facility], [place] [address] [place], [place] [address]      Labs: Basic Metabolic Panel: Recent Labs  Lab 07/23/18 1812 07/23/18 1821 07/24/18 0546 07/25/18 0359  NA 141 140 138 139  K 4.0 4.0 3.9 4.6  CL 103 104 104 104  CO2 26  --  26 25  GLUCOSE 94 90 109* 162*  BUN 27* 27* 23* 23*  CREATININE 1.15 1.20 1.01 1.01  CALCIUM 9.5  --  9.0 9.0   Liver Function Tests: Recent Labs  Lab 07/23/18 1812 07/24/18 0546  AST 17 14*  ALT 33 26  ALKPHOS 66 56  BILITOT 0.6 0.9  PROT 7.3 6.6  ALBUMIN 4.6 4.1   Recent Labs  Lab 07/23/18 1812  LIPASE 28   No results for input(s): AMMONIA in the last 168 hours. CBC: Recent Labs  Lab 07/23/18 1812 07/23/18 1821 07/24/18 0546  WBC 12.4*  --  10.6*  NEUTROABS 8.8*  --  7.4  HGB 15.3 15.3 13.9  HCT 46.4 45.0 42.2  MCV 91.5  --  92.3  PLT 189  --  157   Cardiac Enzymes: No results for input(s): CKTOTAL, CKMB, CKMBINDEX, TROPONINI in the last 168 hours. BNP: BNP (last 3 results) No results for input(s): BNP in the last 8760 hours.  ProBNP (last 3 results) No results for input(s): PROBNP in the last 8760 hours.  CBG: No results for input(s): GLUCAP in the last 168 hours.  Principal Problem:   Incarcerated umbilical hernia Active Problems:   HTN (hypertension)   Seizures (HCC)   OSA on CPAP   GAD (generalized anxiety disorder)   Incarcerated hernia   Time coordinating discharge: 38 minutes.  Signed:        Marlan Steward, DO [facility]  07/25/2018, 5:54 PM

## 2018-07-25 NOTE — Discharge Instructions (Signed)
[facility] _______[facility]  UMBILICAL OR INGUINAL HERNIA REPAIR: POST OP INSTRUCTIONS You can take the dressing off tomorrow, wash incision with soap and water, dry after washing. You can take the steri strips off in 3-4 days if they do not come off with washing.    Always review your discharge instruction sheet given to you by the facility where your surgery was performed. IF YOU HAVE DISABILITY OR FAMILY LEAVE FORMS, YOU MUST BRING THEM TO THE OFFICE FOR PROCESSING.   DO NOT GIVE THEM TO YOUR DOCTOR.  1. A  prescription for pain medication may be given to you upon discharge.  Take your pain medication as prescribed, if needed.  If narcotic pain medicine is not needed, then you may take acetaminophen (Tylenol) or ibuprofen (Advil) as needed. 2. Take your usually prescribed medications unless otherwise directed. If you need a refill on your pain medication, please contact your pharmacy.  They will contact our office to request authorization. Prescriptions will not be filled after 5 pm or on week-ends. 3. You should follow a light diet the first 24 hours after arrival home, such as soup and crackers, etc.  Be sure to include lots of fluids daily.  Resume your normal diet the day after surgery. 4.Most patients will experience some swelling and bruising around the umbilicus or in the groin and scrotum.  Ice packs and reclining will help.  Swelling and bruising can take several days to resolve.  6. It is common to experience some constipation if taking pain medication after surgery.  Increasing fluid intake and taking a stool softener (such as Colace) will usually help or prevent this problem from occurring.  A mild laxative (Milk of Magnesia or Miralax) should be taken according to package directions if there are no bowel movements after 48 hours. 7. Unless discharge instructions indicate otherwise, you may remove your bandages 24-48 hours after surgery, and you may shower at that time.  You  may have steri-strips (small skin tapes) in place directly over the incision.  These strips should be left on the skin for 7-10 days.  If your surgeon used skin glue on the incision, you may shower in 24 hours.  The glue will flake off over the next 2-3 weeks.  Any sutures or staples will be removed at the office during your follow-up visit. 8. ACTIVITIES:  You may resume regular (light) daily activities beginning the next day--such as daily self-care, walking, climbing stairs--gradually increasing activities as tolerated.  You may have sexual intercourse when it is comfortable.  Refrain from any heavy lifting or straining until approved by your doctor.  a.You may drive when you are no longer taking prescription pain medication, you can comfortably wear a seatbelt, and you can safely maneuver your car and apply brakes. b.RETURN TO WORK:   _____________________________________________  9.You should see your doctor in the office for a follow-up appointment approximately 2-3 weeks after your surgery.  Make sure that you call for this appointment within a day or two after you arrive home to insure a convenient appointment time. 10.OTHER INSTRUCTIONS: _________________________    _____________________________________  WHEN TO CALL YOUR DOCTOR: 1. Fever over 101.0 2. Inability to urinate 3. Nausea and/or vomiting 4. Extreme swelling or bruising 5. Continued bleeding from incision. 6. Increased pain, redness, or drainage from the incision  The clinic staff is available to answer your questions during regular business hours.  Please dont hesitate to call and ask to speak to one of the  nurses for clinical concerns.  If you have a medical emergency, go to the nearest emergency room or call 911.  A surgeon from [facility] is always on call at the hospital   [address], [place], [place], [place]  [address] ?  [address], [place], [place]   [address] [phone] ? [phone]  ? FAX [phone] Web site: [email or web address]

## 2018-07-25 NOTE — Progress Notes (Signed)
IV fluids stopped by night shift, reported that patient intake sufficient to be stopped.  No orders seen for this, will await MD to verify.

## 2018-07-25 NOTE — Progress Notes (Signed)
1 Day Post-Op    CC: Abdominal pain  Subjective: Patient looks good this a.m.  Its sore and tender but better than yesterday.  Cellulitis looks about the same as yesterday.  He has not eaten yet.  His IV has been saline locked. Objective: Vital signs in last 24 hours: Temp:  [97.6 F (36.4 C)-98.6 F (37 C)] 98.1 F (36.7 C) (07/23 0541) Pulse Rate:  [60-88] 64 (07/23 0541) Resp:  [11-26] 18 (07/23 0541) BP: (90-129)/(53-95) 100/66 (07/23 0541) SpO2:  [92 %-100 %] 96 % (07/23 0541) Last BM Date: 07/23/18 840 p.o. 3200 IV 1000 urine recorded Afebrile vital signs are stable CMP shows glucose of 162, BUN of 23 otherwise normal.    Intake/Output from previous day: 07/22 0701 - 07/23 0700 In: 4058 [P.O.:840; I.V.:2718; IV Piggyback:500] Out: 8295 [Urine:1000; Blood:25] Intake/Output this shift: No intake/output data recorded.  General appearance: alert, cooperative and no distress GI: Soft, dressing remains in place.  Cellulitis persist, not much change from yesterday.  Lab Results:  Recent Labs    07/23/18 1812 07/23/18 1821 07/24/18 0546  WBC 12.4*  --  10.6*  HGB 15.3 15.3 13.9  HCT 46.4 45.0 42.2  PLT 189  --  157    BMET Recent Labs    07/24/18 0546 07/25/18 0359  NA 138 139  K 3.9 4.6  CL 104 104  CO2 26 25  GLUCOSE 109* 162*  BUN 23* 23*  CREATININE 1.01 1.01  CALCIUM 9.0 9.0   PT/INR Recent Labs    07/24/18 0546  LABPROT 13.1  INR 1.0    Recent Labs  Lab 07/23/18 1812 07/24/18 0546  AST 17 14*  ALT 33 26  ALKPHOS 66 56  BILITOT 0.6 0.9  PROT 7.3 6.6  ALBUMIN 4.6 4.1     Lipase     Component Value Date/Time   LIPASE 28 07/23/2018 1812     Medications: . acetaminophen  1,000 mg Oral Q8H  . atorvastatin  20 mg Oral Daily  . buPROPion  150 mg Oral Daily  . cariprazine  1.5 mg Oral Daily  . diazepam  5 mg Oral Daily  . enoxaparin (LOVENOX) injection  40 mg Subcutaneous Daily  . lisinopril  20 mg Oral Daily   And  .  hydrochlorothiazide  25 mg Oral Daily  . lamoTRIgine  100 mg Oral BID   . sodium chloride Stopped (07/25/18 0715)  .  ceFAZolin (ANCEF) IV Stopped (07/25/18 6213)  . lactated ringers Stopped (07/25/18 0715)    Assessment/Plan Hypertension History of seizures Obstructive sleep apnea Generalized anxiety disorder/Hx of bipolar disorder Hyperlipidemia  Strangulated umbilical hernia with severe cellulitis Primary repair of strangulated umbilical hernia 0/86/5784, Dr. Stark Klein  FEN: Regular diet/IV fluids ID: Ancef 7/22 >>day 2 DVT: Lovenox Follow-up Dr. Barry Dienes POC: Keegan,Lisa Spouse [phone]    Plan: Mobilize, advance diet, if he does well he can probably go home later today.  He needs to be on antibiotics for 7 days.  We currently have him on Ancef, I think he can go home on 7 days of Keflex.  I will put follow-up information and discharge instructions in the AVS.  He cannot take nonsteroidals so he will need to go home on plain Tylenol and oxycodone.    LOS: 1 day    Latoia Eyster 07/25/2018 [phone]

## 2018-07-25 NOTE — Progress Notes (Signed)
Discussed with patient discharge instructions, he verbalized agreement and understanding.  Patient to go home in private vehicle with all belongings.

## 2018-07-29 ENCOUNTER — Encounter: Payer: Self-pay | Admitting: Physician Assistant

## 2018-07-29 ENCOUNTER — Other Ambulatory Visit: Payer: Self-pay

## 2018-07-29 ENCOUNTER — Ambulatory Visit (INDEPENDENT_AMBULATORY_CARE_PROVIDER_SITE_OTHER): Payer: Managed Care, Other (non HMO) | Admitting: Physician Assistant

## 2018-07-29 VITALS — BP 110/80 | HR 87 | Temp 98.4°F | Resp 16 | Ht 69.0 in | Wt 221.0 lb

## 2018-07-29 DIAGNOSIS — K420 Umbilical hernia with obstruction, without gangrene: Secondary | ICD-10-CM | POA: Diagnosis not present

## 2018-07-29 DIAGNOSIS — L03311 Cellulitis of abdominal wall: Secondary | ICD-10-CM | POA: Diagnosis not present

## 2018-07-29 NOTE — Anesthesia Postprocedure Evaluation (Signed)
Anesthesia Post Note  Patient: Todd Harris  Procedure(s) Performed: HERNIA REPAIR UMBILICAL ADULT (N/A )     Patient location during evaluation: PACU Anesthesia Type: General Level of consciousness: awake and alert Pain management: pain level controlled Vital Signs Assessment: post-procedure vital signs reviewed and stable Respiratory status: spontaneous breathing, nonlabored ventilation, respiratory function stable and patient connected to nasal cannula oxygen Cardiovascular status: blood pressure returned to baseline and stable Postop Assessment: no apparent nausea or vomiting Anesthetic complications: no    Last Vitals:  Vitals:   07/25/18 0541 07/25/18 0931  BP: 100/66 113/70  Pulse: 64 77  Resp: 18 17  Temp: 36.7 C 36.7 C  SpO2: 96% 96%    Last Pain:  Vitals:   07/25/18 1038  TempSrc:   PainSc: 3                  Thanvi Blincoe

## 2018-07-29 NOTE — Progress Notes (Signed)
Patient presents to clinic today for Hospital follow-up. Patient was sent to the ER by this provider on 07/23/2018 due to concern for strangulated hernia with cellulitis. ER workup included normal UA, CBC with WBC at 12.4 with 69% neutrophils, Hgb at 15.3 an platelets 289, Lactic acid at 0.8. COVID screen negative. CT abdomen/pelvis revealed small fat-containing umbilical hernia with inflammatory changes suggestive of strangulation. General surgery was consulted and patient underwent umbilical hernia repair on 0/35/46 without complication. Was having normal BM and pain controlled so discharged home on 07/25/2018 to complete course of oral Keflex QID and pain medication. Notes he is taking antibiotic as directed. Is not needing pain medication. Pain is about 1/10 on average, going up to a 3/10 with coughing or sneezing, etc. Denies redness or drainage from the site. Is eating and hydrating well with normal urinary habits and BM. Denies nausea or vomiting. Overall feels well. Has follow-up appointment with surgery on 8/10.  Past Medical History:  Diagnosis Date  . Anxiety   . Atrial arrhythmia   . Barrett esophagus   . Bipolar 1 disorder ([place])   . Cavernous angioma   . Depression   . History of chicken pox   . Hypertension   . Seizures ([place])    1 in 2005, 1 in 2016   . Sleep apnea   . Sleep apnea   . Stroke ([place]) 11/2014   No residual deficits  . Venous malformation    Brain    Current Outpatient Medications on File Prior to Visit  Medication Sig Dispense Refill  . acetaminophen (TYLENOL) 500 MG tablet You can take 1000 mg every 6 hours.  I would schedule it for every 6 hours for the first 24-48 hours.  After that you can go to 1000 mg every 6 hours as needed.  You can buy this over-the-counter at any drugstore.  Do not take over 4000 mg it can harm your liver. 30 tablet 0  . atorvastatin (LIPITOR) 20 MG tablet TAKE 1 TABLET BY MOUTH EVERY DAY (Patient taking differently: Take 20 mg by mouth  daily. ) 90 tablet 1  . buPROPion (WELLBUTRIN XL) 150 MG 24 hr tablet Take 1 tablet (150 mg total) by mouth daily. 90 tablet 1  . cariprazine (VRAYLAR) capsule Take 1 capsule (1.5 mg total) by mouth daily. 90 capsule 1  . cephALEXin (KEFLEX) 500 MG capsule Take 1 capsule (500 mg total) by mouth 4 (four) times daily for 7 days. 28 capsule 0  . diazepam (VALIUM) 5 MG tablet Take 1 tablet (5 mg total) by mouth daily. 90 tablet 1  . lamoTRIgine (LAMICTAL) 100 MG tablet Take 1 tablet (100 mg total) by mouth 2 (two) times daily. 180 tablet 6  . lisinopril-hydrochlorothiazide (ZESTORETIC) 20-25 MG tablet TAKE 1 TABLET BY MOUTH EVERY DAY (Patient taking differently: Take 1 tablet by mouth daily. ) 90 tablet 1  . meclizine (MEDI-MECLIZINE) 25 MG tablet Take 1 tablet (25 mg total) by mouth 3 (three) times daily as needed for dizziness. 60 tablet 6  . zaleplon (SONATA) 5 MG capsule Take 1 capsule (5 mg total) by mouth at bedtime as needed. for sleep 30 capsule 1   No current facility-administered medications on file prior to visit.     No Known Allergies  Family History  Problem Relation Age of Onset  . Aneurysm Maternal Grandmother   . Asthma Mother   . Diabetes Mother   . Hypertension Mother   . Diabetes Brother   .  Diabetes Brother   . Pancreatic cancer Father   . Diabetes Father   . Cancer Father        Pancreatic  . Diabetes Paternal Grandmother   . Diabetes Paternal Grandfather   . Colon cancer Neg Hx     Social History   Socioeconomic History  . Marital status: Married    Spouse name: [name]   . Number of children: 3  . Years of education: 16+  . Highest education level: Not on file  Occupational History  . 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 Financial resource strain: Not on file  . Food insecurity    Worry: Not on file    Inability: Not on file  . Transportation needs    Medical: Not on file    Non-medical: Not on file  Tobacco Use  . Smoking  status: Never Smoker  . Smokeless tobacco: Never Used  Substance and Sexual Activity  . Alcohol use: Yes    Alcohol/week: 4.0 standard drinks    Types: 4 Standard drinks or equivalent per week    Frequency: Never    Comment: was 4-5 beers/week  . Drug use: No  . Sexual activity: Yes  Lifestyle  . Physical activity    Days per week: Not on file    Minutes per session: Not on file  . Stress: Not on file  Relationships  . Social Herbalist on phone: Not on file    Gets together: Not on file    Attends religious service: Not on file    Active member of club or organization: Not on file    Attends meetings of clubs or organizations: Not on file    Relationship status: Not on file  Other Topics Concern  . Not on file  Social History Narrative   Lives in [place].    Married. 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      Caffeine use: 2 cup coffee per day   Review of Systems - See HPI.  All other ROS are negative.  There were no vitals taken for this visit.  Physical Exam Vitals signs reviewed.  Constitutional:      Appearance: Normal appearance.  HENT:     Head: Normocephalic and atraumatic.  Cardiovascular:     Rate and Rhythm: Normal rate and regular rhythm.     Pulses: Normal pulses.     Heart sounds: Normal heart sounds.  Pulmonary:     Effort: Pulmonary effort is normal.     Breath sounds: Normal breath sounds.  Abdominal:    Neurological:     Mental Status: He is alert.     Recent Results (from the past 2160 hour(s))  Urinalysis, Routine w reflex microscopic     Status: None   Collection Time: 07/23/18  5:52 PM  Result Value Ref Range   Color, Urine YELLOW YELLOW   APPearance CLEAR CLEAR   Specific Gravity, Urine 1.024 1.005 - 1.030   pH 6.0 5.0 - 8.0   Glucose, UA NEGATIVE NEGATIVE mg/dL   Hgb urine dipstick NEGATIVE NEGATIVE   Bilirubin Urine NEGATIVE NEGATIVE   Ketones, ur NEGATIVE NEGATIVE mg/dL   Protein,  ur NEGATIVE NEGATIVE mg/dL   Nitrite NEGATIVE NEGATIVE   Leukocytes,Ua NEGATIVE NEGATIVE    Comment: Performed at [facility] [address], [place], [place] [address]  SARS Coronavirus 2 (CEPHEID - Performed in [facility]  hospital lab), Hosp Order     Status: None   Collection Time: 07/23/18  6:04 PM   Specimen: Nasopharyngeal Swab  Result Value Ref Range   SARS Coronavirus 2 NEGATIVE NEGATIVE    Comment: (NOTE) If result is NEGATIVE SARS-CoV-2 target nucleic acids are NOT DETECTED. The SARS-CoV-2 RNA is generally detectable in upper and lower  respiratory specimens during the acute phase of infection. The lowest  concentration of SARS-CoV-2 viral copies this assay can detect is 250  copies / mL. A negative result does not preclude SARS-CoV-2 infection  and should not be used as the sole basis for treatment or other  patient management decisions.  A negative result may occur with  improper specimen collection / handling, submission of specimen other  than nasopharyngeal swab, presence of viral mutation(s) within the  areas targeted by this assay, and inadequate number of viral copies  (<250 copies / mL). A negative result must be combined with clinical  observations, patient history, and epidemiological information. If result is POSITIVE SARS-CoV-2 target nucleic acids are DETECTED. The SARS-CoV-2 RNA is generally detectable in upper and lower  respiratory specimens dur ing the acute phase of infection.  Positive  results are indicative of active infection with SARS-CoV-2.  Clinical  correlation with patient history and other diagnostic information is  necessary to determine patient infection status.  Positive results do  not rule out bacterial infection or co-infection with other viruses. If result is PRESUMPTIVE POSTIVE SARS-CoV-2 nucleic acids MAY BE PRESENT.   A presumptive positive result was obtained on the submitted specimen  and confirmed on repeat  testing.  While 2019 novel coronavirus  (SARS-CoV-2) nucleic acids may be present in the submitted sample  additional confirmatory testing may be necessary for epidemiological  and / or clinical management purposes  to differentiate between  SARS-CoV-2 and other Sarbecovirus currently known to infect humans.  If clinically indicated additional testing with an alternate test  methodology [phone]) is advised. The SARS-CoV-2 RNA is generally  detectable in upper and lower respiratory sp ecimens during the acute  phase of infection. The expected result is Negative. Fact Sheet for Patients:  StrictlyIdeas.no Fact Sheet for Healthcare Providers: BankingDealers.co.za This test is not yet approved or cleared by the [place] FDA and has been authorized for detection and/or diagnosis of SARS-CoV-2 by FDA under an Emergency Use Authorization (EUA).  This EUA will remain in effect (meaning this test can be used) for the duration of the COVID-19 declaration under Section 564(b)(1) of the Act, 21 U.S.C. section 360bbb-3(b)(1), unless the authorization is terminated or revoked sooner. Performed at [facility], [place] [address] [place], [place] [address]   CBC with Differential     Status: Abnormal   Collection Time: 07/23/18  6:12 PM  Result Value Ref Range   WBC 12.4 (H) 4.0 - 10.5 K/uL   RBC 5.07 4.22 - 5.81 MIL/uL   Hemoglobin 15.3 13.0 - 17.0 g/dL   HCT 46.4 39.0 - 52.0 %   MCV 91.5 80.0 - 100.0 fL   MCH 30.2 26.0 - 34.0 pg   MCHC 33.0 30.0 - 36.0 g/dL   RDW 13.2 11.5 - 15.5 %   Platelets 189 150 - 400 K/uL   nRBC 0.0 0.0 - 0.2 %   Neutrophils Relative % 69 %   Neutro Abs 8.8 (H) 1.7 - 7.7 K/uL   Lymphocytes Relative 16 %   Lymphs Abs 2.0 0.7 - 4.0 K/uL   Monocytes Relative  9 %   Monocytes Absolute 1.1 (H) 0.1 - 1.0 K/uL   Eosinophils Relative 4 %   Eosinophils Absolute 0.5 0.0 - 0.5 K/uL   Basophils Relative 1 %    Basophils Absolute 0.1 0.0 - 0.1 K/uL   Immature Granulocytes 1 %   Abs Immature Granulocytes 0.09 (H) 0.00 - 0.07 K/uL    Comment: Performed at [facility], [place] [address] [place], [place] [address]  Comprehensive metabolic panel     Status: Abnormal   Collection Time: 07/23/18  6:12 PM  Result Value Ref Range   Sodium 141 135 - 145 mmol/L   Potassium 4.0 3.5 - 5.1 mmol/L   Chloride 103 98 - 111 mmol/L   CO2 26 22 - 32 mmol/L   Glucose, Bld 94 70 - 99 mg/dL   BUN 27 (H) 6 - 20 mg/dL   Creatinine, Ser 1.15 0.61 - 1.24 mg/dL   Calcium 9.5 8.9 - 10.3 mg/dL   Total Protein 7.3 6.5 - 8.1 g/dL   Albumin 4.6 3.5 - 5.0 g/dL   AST 17 15 - 41 U/L   ALT 33 0 - 44 U/L   Alkaline Phosphatase 66 38 - 126 U/L   Total Bilirubin 0.6 0.3 - 1.2 mg/dL   GFR calc non Af Amer >60 >60 mL/min   GFR calc Af Amer >60 >60 mL/min   Anion gap 12 5 - 15    Comment: Performed at [facility], [place] [address], [place], [place] [address]  Lipase, blood     Status: None   Collection Time: 07/23/18  6:12 PM  Result Value Ref Range   Lipase 28 11 - 51 U/L    Comment: Performed at [facility], [place] [address] [place], [place] [address]  I-stat chem 8, ED (not at [facility] or [facility])     Status: Abnormal   Collection Time: 07/23/18  6:21 PM  Result Value Ref Range   Sodium 140 135 - 145 mmol/L   Potassium 4.0 3.5 - 5.1 mmol/L   Chloride 104 98 - 111 mmol/L   BUN 27 (H) 6 - 20 mg/dL   Creatinine, Ser 1.20 0.61 - 1.24 mg/dL   Glucose, Bld 90 70 - 99 mg/dL   Calcium, Ion 1.14 (L) 1.15 - 1.40 mmol/L   TCO2 26 22 - 32 mmol/L   Hemoglobin 15.3 13.0 - 17.0 g/dL   HCT 45.0 39.0 - 52.0 %  Lactic acid, plasma     Status: None   Collection Time: 07/23/18  6:51 PM  Result Value Ref Range   Lactic Acid, Venous 0.8 0.5 - 1.9 mmol/L    Comment: Performed at [facility], [place] [address] [place], [place] [address]  HIV antibody (Routine Testing)      Status: None   Collection Time: 07/24/18  5:46 AM  Result Value Ref Range   HIV Screen 4th Generation wRfx Non Reactive Non Reactive    Comment: (NOTE) Performed At: [facility] [place], [place] [id number] Rush Farmer MD [id number]   CBC WITH DIFFERENTIAL     Status: Abnormal   Collection Time: 07/24/18  5:46 AM  Result Value Ref Range   WBC 10.6 (H) 4.0 - 10.5 K/uL   RBC 4.57 4.22 - 5.81 MIL/uL   Hemoglobin 13.9 13.0 - 17.0 g/dL   HCT 42.2 39.0 - 52.0 %   MCV 92.3 80.0 - 100.0 fL   MCH 30.4 26.0 -  34.0 pg   MCHC 32.9 30.0 - 36.0 g/dL   RDW 13.3 11.5 - 15.5 %   Platelets 157 150 - 400 K/uL   nRBC 0.0 0.0 - 0.2 %   Neutrophils Relative % 69 %   Neutro Abs 7.4 1.7 - 7.7 K/uL   Lymphocytes Relative 15 %   Lymphs Abs 1.6 0.7 - 4.0 K/uL   Monocytes Relative 10 %   Monocytes Absolute 1.0 0.1 - 1.0 K/uL   Eosinophils Relative 4 %   Eosinophils Absolute 0.4 0.0 - 0.5 K/uL   Basophils Relative 1 %   Basophils Absolute 0.1 0.0 - 0.1 K/uL   Immature Granulocytes 1 %   Abs Immature Granulocytes 0.08 (H) 0.00 - 0.07 K/uL    Comment: Performed at [facility], [place] [address] [place], [place] [address]  Comprehensive metabolic panel     Status: Abnormal   Collection Time: 07/24/18  5:46 AM  Result Value Ref Range   Sodium 138 135 - 145 mmol/L   Potassium 3.9 3.5 - 5.1 mmol/L   Chloride 104 98 - 111 mmol/L   CO2 26 22 - 32 mmol/L   Glucose, Bld 109 (H) 70 - 99 mg/dL   BUN 23 (H) 6 - 20 mg/dL   Creatinine, Ser 1.01 0.61 - 1.24 mg/dL   Calcium 9.0 8.9 - 10.3 mg/dL   Total Protein 6.6 6.5 - 8.1 g/dL   Albumin 4.1 3.5 - 5.0 g/dL   AST 14 (L) 15 - 41 U/L   ALT 26 0 - 44 U/L   Alkaline Phosphatase 56 38 - 126 U/L   Total Bilirubin 0.9 0.3 - 1.2 mg/dL   GFR calc non Af Amer >60 >60 mL/min   GFR calc Af Amer >60 >60 mL/min   Anion gap 8 5 - 15    Comment: Performed at [facility], [place] [address], [place], [place]  [address]  Protime-INR     Status: None   Collection Time: 07/24/18  5:46 AM  Result Value Ref Range   Prothrombin Time 13.1 11.4 - 15.2 seconds   INR 1.0 0.8 - 1.2    Comment: (NOTE) INR goal varies based on device and disease states. Performed at [facility], [place] [address], [place], [place] [address]   APTT     Status: None   Collection Time: 07/24/18  5:46 AM  Result Value Ref Range   aPTT 26 24 - 36 seconds    Comment: Performed at [facility], [place] [address], [place], [place] [address]  Basic metabolic panel     Status: Abnormal   Collection Time: 07/25/18  3:59 AM  Result Value Ref Range   Sodium 139 135 - 145 mmol/L   Potassium 4.6 3.5 - 5.1 mmol/L   Chloride 104 98 - 111 mmol/L   CO2 25 22 - 32 mmol/L   Glucose, Bld 162 (H) 70 - 99 mg/dL   BUN 23 (H) 6 - 20 mg/dL   Creatinine, Ser 1.01 0.61 - 1.24 mg/dL   Calcium 9.0 8.9 - 10.3 mg/dL   GFR calc non Af Amer >60 >60 mL/min   GFR calc Af Amer >60 >60 mL/min   Anion gap 10 5 - 15    Comment: Performed at [facility], [place] [address], [place], [place] [address]   Assessment/Plan: 1. Incarcerated umbilical hernia 2. Cellulitis of abdominal wall Hernia s/p surgical repair with good approximation of wound and  routine healing. Steri strips removed today. No drainage or fluctuance noted of wound. BS normal in all 4Qs. No other abdominal tenderness noted. Finish course of antibiotic. Repeat CBC today. Follow-up with Gen Surg as scheduled. Reviewed strict return precautions and signs of infection. Patient voiced understanding and agreement with plan.  - CBC w/Diff   Leeanne Rio, PA-C

## 2018-07-29 NOTE — Patient Instructions (Signed)
Please go to the lab today for blood work.  I will call you with your results. We will alter treatment regimen(s) if indicated by your results.   Please complete entire course of antibiotic. No heavy lifting! Keep skin clean and dry -- warm clean water and small amount of soap. Pat dry well. No baths, pools, Jacuzzi, etc.   Follow-up with the Surgeon as scheduled.   If you note any increase in redness, pain or new onset drainage or fever, inability to pass stool or gas, please notify us or the surgeon ASAP.

## 2018-07-30 LAB — CBC WITH DIFFERENTIAL/PLATELET
Basophils Absolute: 0.1 10*3/uL (ref 0.0–0.1)
Basophils Relative: 0.7 % (ref 0.0–3.0)
Eosinophils Absolute: 0.5 10*3/uL (ref 0.0–0.7)
Eosinophils Relative: 5.3 % — ABNORMAL HIGH (ref 0.0–5.0)
HCT: 45.1 % (ref 39.0–52.0)
Hemoglobin: 15.2 g/dL (ref 13.0–17.0)
Lymphocytes Relative: 13.6 % (ref 12.0–46.0)
Lymphs Abs: 1.3 10*3/uL (ref 0.7–4.0)
MCHC: 33.6 g/dL (ref 30.0–36.0)
MCV: 89.2 fl (ref 78.0–100.0)
Monocytes Absolute: 0.6 10*3/uL (ref 0.1–1.0)
Monocytes Relative: 6.4 % (ref 3.0–12.0)
Neutro Abs: 7.2 10*3/uL (ref 1.4–7.7)
Neutrophils Relative %: 74 % (ref 43.0–77.0)
Platelets: 213 10*3/uL (ref 150.0–400.0)
RBC: 5.06 Mil/uL (ref 4.22–5.81)
RDW: 13.6 % (ref 11.5–15.5)
WBC: 9.7 10*3/uL (ref 4.0–10.5)

## 2018-08-02 ENCOUNTER — Other Ambulatory Visit: Payer: Self-pay | Admitting: Physician Assistant

## 2018-08-02 ENCOUNTER — Other Ambulatory Visit: Payer: Self-pay

## 2018-08-02 ENCOUNTER — Ambulatory Visit (INDEPENDENT_AMBULATORY_CARE_PROVIDER_SITE_OTHER): Payer: Managed Care, Other (non HMO)

## 2018-08-02 DIAGNOSIS — I10 Essential (primary) hypertension: Secondary | ICD-10-CM

## 2018-08-02 LAB — LIPID PANEL
Cholesterol: 116 mg/dL (ref 0–200)
HDL: 34.8 mg/dL — ABNORMAL LOW (ref >39.00)
LDL Cholesterol: 59 mg/dL (ref 0–99)
NonHDL: 81.35
Total CHOL/HDL Ratio: 3
Triglycerides: 111 mg/dL (ref 0.0–149.0)
VLDL: 22.2 mg/dL (ref 0.0–40.0)

## 2018-09-18 ENCOUNTER — Other Ambulatory Visit: Payer: Self-pay | Admitting: Physician Assistant

## 2018-09-18 DIAGNOSIS — E785 Hyperlipidemia, unspecified: Secondary | ICD-10-CM

## 2018-10-12 ENCOUNTER — Other Ambulatory Visit: Payer: Self-pay | Admitting: Physician Assistant

## 2018-10-12 DIAGNOSIS — I10 Essential (primary) hypertension: Secondary | ICD-10-CM

## 2018-11-06 ENCOUNTER — Other Ambulatory Visit: Payer: Self-pay | Admitting: Neurology

## 2018-11-06 MED ORDER — ONDANSETRON 4 MG PO TBDP: 4.0000 mg | ORAL_TABLET | Freq: Three times a day (TID) | ORAL | 6 refills | Status: DC | PRN

## 2018-12-23 ENCOUNTER — Encounter: Payer: Self-pay | Admitting: Psychiatry

## 2018-12-23 ENCOUNTER — Ambulatory Visit: Payer: Managed Care, Other (non HMO) | Admitting: Psychiatry

## 2018-12-23 ENCOUNTER — Other Ambulatory Visit: Payer: Self-pay

## 2018-12-23 ENCOUNTER — Ambulatory Visit (INDEPENDENT_AMBULATORY_CARE_PROVIDER_SITE_OTHER): Payer: 59 | Admitting: Psychiatry

## 2018-12-23 VITALS — Ht 69.0 in | Wt 230.0 lb

## 2018-12-23 DIAGNOSIS — F3173 Bipolar disorder, in partial remission, most recent episode manic: Secondary | ICD-10-CM

## 2018-12-23 DIAGNOSIS — F411 Generalized anxiety disorder: Secondary | ICD-10-CM | POA: Diagnosis not present

## 2018-12-23 MED ORDER — CARIPRAZINE HCL 1.5 MG PO CAPS: 1.5000 mg | ORAL_CAPSULE | Freq: Every day | ORAL | 1 refills | Status: DC

## 2018-12-23 MED ORDER — BUPROPION HCL ER (XL) 150 MG PO TB24: 150.0000 mg | ORAL_TABLET | Freq: Every day | ORAL | 1 refills | Status: DC

## 2018-12-23 MED ORDER — DIAZEPAM 5 MG PO TABS: 5.0000 mg | ORAL_TABLET | Freq: Every day | ORAL | 1 refills | Status: DC | PRN

## 2018-12-23 NOTE — Progress Notes (Signed)
[facility] Med Check  Patient ID: Todd Harris,  MRN: [id number]  PCP: Brunetta Jeans, PA-C  Date of Evaluation: 12/23/2018 Time spent:20 minutes from 1045 to 1105  Chief Complaint:  Chief Complaint    Anxiety; Depression; Manic Behavior      HISTORY/CURRENT STATUS: Todd Harris is seen onsite in office 20 minutes face-to-face individually with consent with epic collateral for psychiatric interview and exam in 47-month evaluation and management of bipolar disorder and generalized anxiety in the setting of major medical comorbidities.  Patient in the interim had a strangulated umbilical hernia as well as a medical appointment for hypertension still taking Lipitor and Zestoretic from primary care as well as Lamictal and Zofran ODT for vertigo from neurologist Dr. Jaynee Harris.  He notes using Valium to for anxiety but gets sleepy by mid afternoon working from home so we will try to change Valium to as needed.  He rarely if ever needs the Sonata from Dr. Jaynee Harris but does continue CPAP which records his actual sleep time daily.  Wellbutrin and Vraylar are necessary to continue with the hope to keep the Valium on hand in case anxiety increases off of it as more of a behavioral solution.  He has had no angiomas bleed in the head in the last 6 months following gamma knife excision of the primary bleeding site.  He has not had further resolution of pontine angioma bleed's associated stroke neurologically.  He feels confined at home likely to work there permanently, wife and he having separate offices.  [place] registry documents last Valium dispensing October 10 and last Sonata May 21 having gained 12 pounds in the last 6 months partially associated with working at home and having the strangulated umbilical hernia requiring hospitalization after surgery.  He expects his current company workplace to be secure.  He has no mania, suicidality, psychosis or delirium as he continues Dietitian and Wellbutrin.  He uses social  alcohol but no tobacco.  Depression  This is a recurrentproblem currently in partial remission last episode starting more than 1 year ago. The onset quality was sudden. The problem occurs intermittently.The relapse of problem has been gradually improvingsince last onset.Associated symptoms include decreased concentration,fatigue,headachesand sad. Associated symptoms include no hopelessness, no insomnia,no decreased interestand no current suicidal ideas though prior attemptThe symptoms are aggravated by work stress, medication and social issues.Past treatments include other medications.Compliance with treatment is good.Past compliance problems include difficulty with treatment plan, medication issues, medical issues and insurance issues.Previous treatment provided moderaterelief.Past medical history includes anxiety.  Anxiety Presents for follow-up visit. Symptoms include decreased concentration, dizziness,  inhibitory avoidance,excessive worry, malaise, muscle tension and nervous/anxious behavior. Patient reports no depressed mood, palpitations,  insomnia or suicidal ideas. Symptoms occur most days. The severity of symptoms is interfering with daily activities and causing significant distress. The quality of sleep is fair. Nighttime awakenings: occasional.   Individual Medical History/ Review of Systems: Changes? :No  in the interim having surgery for a strangulated umbilical hernia as well as a medical appointment for hypertension, still taking Lipitor and Zestoretic as well as Lamictal and Zofran ODT for vertigo from neurologist Dr. Jaynee Harris and from primary care.    Allergies: Patient has no known allergies.  Current Medications:  Current Outpatient Medications:  .  acetaminophen (TYLENOL) 500 MG tablet, You can take 1000 mg every 6 hours.  I would schedule it for every 6 hours for the first 24-48 hours.  After that you can go to 1000 mg every 6 hours as needed.  You  can buy this over-the-counter at any drugstore.  Do not take over 4000 mg it can harm your liver., Disp: 30 tablet, Rfl: 0 .  atorvastatin (LIPITOR) 20 MG tablet, TAKE 1 TABLET BY MOUTH EVERY DAY, Disp: 90 tablet, Rfl: 1 .  buPROPion (WELLBUTRIN XL) 150 MG 24 hr tablet, Take 1 tablet (150 mg total) by mouth daily., Disp: 90 tablet, Rfl: 1 .  cariprazine (VRAYLAR) capsule, Take 1 capsule (1.5 mg total) by mouth daily., Disp: 90 capsule, Rfl: 1 .  diazepam (VALIUM) 5 MG tablet, Take 1 tablet (5 mg total) by mouth daily as needed for anxiety., Disp: 90 tablet, Rfl: 1 .  lamoTRIgine (LAMICTAL) 100 MG tablet, Take 1 tablet (100 mg total) by mouth 2 (two) times daily., Disp: 180 tablet, Rfl: 6 .  lisinopril-hydrochlorothiazide (ZESTORETIC) 20-25 MG tablet, TAKE 1 TABLET BY MOUTH EVERY DAY, Disp: 90 tablet, Rfl: 1 .  meclizine (MEDI-MECLIZINE) 25 MG tablet, Take 1 tablet (25 mg total) by mouth 3 (three) times daily as needed for dizziness., Disp: 60 tablet, Rfl: 6 .  ondansetron (ZOFRAN-ODT) 4 MG disintegrating tablet, Take 1-2 tablets (4-8 mg total) by mouth every 8 (eight) hours as needed., Disp: 90 tablet, Rfl: 6 .  zaleplon (SONATA) 5 MG capsule, Take 1 capsule (5 mg total) by mouth at bedtime as needed. for sleep, Disp: 30 capsule, Rfl: 1   Medication Side Effects: none  Family Medical/ Social History: Changes? No  MENTAL HEALTH EXAM:  Height 5\' 9"  (1.753 m), weight 230 lb (104.3 kg).Body mass index is 33.97 kg/m. Muscle strengths and tone 5/5, postural reflexes and gait -2/-2, and AIMS = 0.  Hypnesthesia is a result of his pontine stroke.  Otherwise deferred for coronavirus shutdown  General Appearance: Casual, Fairly Groomed, Guarded and Obese  Eye Contact:  Good  Speech:  Clear and Coherent, Normal Rate and Talkative  Volume:  Normal  Mood:  Anxious, Dysphoric and Euthymic  Affect:  Congruent, Constricted, Depressed, Inappropriate, Labile and Anxious  Thought Process:  Coherent, Goal  Directed, Irrelevant and Descriptions of Associations: Tangential  Orientation:  Full (Time, Place, and Person)  Thought Content: Ilusions, Rumination and Tangential   Suicidal Thoughts:  No  Homicidal Thoughts:  No  Memory:  Immediate;   Fair Remote;   Fair  Judgement:  Fair  Insight:  Good  Psychomotor Activity:  Normal, Decreased, Mannerisms and Psychomotor Retardation  Concentration:  Concentration: Fair and Attention Span: Fair  Recall:  [employer] of Knowledge: Good  Language: Good  Assets:  Desire for Improvement Resilience Social Support Vocational/Educational  ADL's:  Intact  Cognition: WNL  Prognosis:  Fair    DIAGNOSES:    ICD-10-CM   1. Bipolar I disorder, current or most recent episode manic, in partial remission with anxious distress (HCC)  F31.73 diazepam (VALIUM) 5 MG tablet    buPROPion (WELLBUTRIN XL) 150 MG 24 hr tablet    cariprazine (VRAYLAR) capsule  2. GAD (generalized anxiety disorder)  F41.1 diazepam (VALIUM) 5 MG tablet    buPROPion (WELLBUTRIN XL) 150 MG 24 hr tablet    Receiving Psychotherapy: No    RECOMMENDATIONS: Psycho supportive psychoeducation integrates cognitive behavioral nutrition, sleep hygiene, executive organization, and reinforcing object relations for fulfillment in marriage confined to working from home with the relative ominous anticipation of further neurologic insults while maintaining stability of bipolar and anxiety.  For the sake of optimizing alertness through the workday and minimizing any impact upon muscle tone while appreciating that  Valium has some positive impact on vertigo, we carefully change the Valium to 5 mg daily if needed sent as #90 with 1 refill sent to [facility] hoping to minimize its use for the sake of being as alert as possible.  He has not needed Sonata 5 mg but does use his CPAP for sleep.  He continues Lipitor, Zestoretic, and Lamictal, and the Zofran ODT as needed for vertigo.  He is E scribed from here  Wellbutrin 150 mg XL every morning and Vraylar 1.5 mg every morning as #90 each with 1 refill sent to [facility] for bipolar depression and generalized anxiety.  He returns in 6 months or sooner if needed for follow-up.   Delight Hoh, MD

## 2019-03-07 ENCOUNTER — Encounter: Payer: Self-pay | Admitting: Physician Assistant

## 2019-03-10 ENCOUNTER — Encounter: Payer: Self-pay | Admitting: Physician Assistant

## 2019-03-13 ENCOUNTER — Other Ambulatory Visit: Payer: Self-pay | Admitting: Physician Assistant

## 2019-03-13 ENCOUNTER — Ambulatory Visit: Payer: Managed Care, Other (non HMO) | Attending: Internal Medicine

## 2019-03-13 DIAGNOSIS — E785 Hyperlipidemia, unspecified: Secondary | ICD-10-CM

## 2019-03-13 DIAGNOSIS — Z23 Encounter for immunization: Secondary | ICD-10-CM

## 2019-03-13 NOTE — Progress Notes (Signed)
   Covid-19 Vaccination Clinic  Name:  Todd Harris    MRN: [id number] DOB: 10/29/1960  03/13/2019  [name] was observed post Covid-19 immunization for 15 minutes without incident. He was provided with Vaccine Information Sheet and instruction to access the V-Safe system.   [name] was instructed to call 911 with any severe reactions post vaccine: Marland Kitchen Difficulty breathing  . Swelling of face and throat  . A fast heartbeat  . A bad rash all over body  . Dizziness and weakness   Immunizations Administered    Name Date Dose VIS Date Route   Pfizer COVID-19 Vaccine 03/13/2019 11:41 AM 0.3 mL 12/13/2018 Intramuscular   Manufacturer: [place]   Lot: [id number]   [place]: [id number]

## 2019-04-09 ENCOUNTER — Ambulatory Visit: Payer: Managed Care, Other (non HMO) | Attending: Internal Medicine

## 2019-04-09 DIAGNOSIS — Z23 Encounter for immunization: Secondary | ICD-10-CM

## 2019-04-09 NOTE — Progress Notes (Signed)
   Covid-19 Vaccination Clinic  Name:  Todd Harris    MRN: [id number] DOB: 1960/05/20  04/09/2019  Mr. Todd Harris was observed post Covid-19 immunization for 15 minutes without incident. He was provided with Vaccine Information Sheet and instruction to access the V-Safe system.   Mr. Todd Harris was instructed to call 911 with any severe reactions post vaccine: Marland Kitchen Difficulty breathing  . Swelling of face and throat  . A fast heartbeat  . A bad rash all over body  . Dizziness and weakness   Immunizations Administered    Name Date Dose VIS Date Route   Pfizer COVID-19 Vaccine 04/09/2019  1:50 PM 0.3 mL 12/13/2018 Intramuscular   Manufacturer: [place]   Lot: [id number]   [place]: [id number]

## 2019-04-22 ENCOUNTER — Encounter: Payer: Self-pay | Admitting: Emergency Medicine

## 2019-04-22 ENCOUNTER — Other Ambulatory Visit: Payer: Self-pay | Admitting: Physician Assistant

## 2019-04-22 DIAGNOSIS — I10 Essential (primary) hypertension: Secondary | ICD-10-CM

## 2019-04-26 ENCOUNTER — Other Ambulatory Visit: Payer: Self-pay | Admitting: Neurology

## 2019-05-02 ENCOUNTER — Ambulatory Visit (INDEPENDENT_AMBULATORY_CARE_PROVIDER_SITE_OTHER): Payer: Managed Care, Other (non HMO) | Admitting: Physician Assistant

## 2019-05-02 ENCOUNTER — Other Ambulatory Visit: Payer: Self-pay

## 2019-05-02 ENCOUNTER — Encounter: Payer: Self-pay | Admitting: Physician Assistant

## 2019-05-02 VITALS — BP 120/82 | HR 68 | Temp 98.2°F | Resp 16 | Ht 69.0 in | Wt 228.0 lb

## 2019-05-02 DIAGNOSIS — Z0000 Encounter for general adult medical examination without abnormal findings: Secondary | ICD-10-CM

## 2019-05-02 DIAGNOSIS — Z125 Encounter for screening for malignant neoplasm of prostate: Secondary | ICD-10-CM | POA: Diagnosis not present

## 2019-05-02 DIAGNOSIS — E669 Obesity, unspecified: Secondary | ICD-10-CM

## 2019-05-02 DIAGNOSIS — G4733 Obstructive sleep apnea (adult) (pediatric): Secondary | ICD-10-CM

## 2019-05-02 DIAGNOSIS — I10 Essential (primary) hypertension: Secondary | ICD-10-CM | POA: Diagnosis not present

## 2019-05-02 DIAGNOSIS — G4701 Insomnia due to medical condition: Secondary | ICD-10-CM

## 2019-05-02 DIAGNOSIS — Z9989 Dependence on other enabling machines and devices: Secondary | ICD-10-CM

## 2019-05-02 NOTE — Patient Instructions (Signed)
Please go to the lab for blood work.   Our office will call you with your results unless you have chosen to receive results via MyChart.  If your blood work is normal we will follow-up each year for physicals and as scheduled for chronic medical problems.  If anything is abnormal we will treat accordingly and get you in for a follow-up.  For weight, try to download a calorie tracker app on your phone.  I want you to track everything you eat for a 48 hours period. This is so you can get a better idea of what all you are consuming in a day and learn where your weaknesses are (time of day, certain foods, etc).   Try to use smaller plates with plating your meals.  This tricks the mind into thinking you eat more food than you really have and limits overeating.   Drink plenty of water.  Please start Pure ZZZs over the counter to help with sleep. Please review the sleep hygiene practices below. Let me know if sleep is not improving.  Sleep Hygiene  Do: (1) Go to bed at the same time each day. (2) Get up from bed at the same time each day. (3) Get regular exercise each day, preferably in the morning.  There is goof evidence that regular exercise improves restful sleep.  This includes stretching and aerobic exercise. (4) Get regular exposure to outdoor or bright lights, especially in the late afternoon. (5) Keep the temperature in your bedroom comfortable. (6) Keep the bedroom quiet when sleeping. (7) Keep the bedroom dark enough to facilitate sleep. (8) Use your bed only for sleep and sex. (9) Take medications as directed.  It is helpful to take prescribed sleeping pills 1 hour before bedtime, so they are causing drowsiness when you lie down, or 10 hours before getting up, to avoid daytime drowsiness. (10) Use a relaxation exercise just before going to sleep -- imagery, massage, warm bath. (11) Keep your feet and hands warm.  Wear warm socks and/or mittens or gloves to bed.  Don't: (1)  Exercise just before going to bed. (2) Engage in stimulating activity just before bed, such as playing a competitive game, watching an exciting program on television, or having an important discussion with a loved one. (3) Have caffeine in the evening (coffee, teas, chocolate, sodas, etc.) (4) Read or watch television in bed. (5) Use alcohol to help you sleep. (6) Go to bed too hungry or too full. (7) Take another person's sleeping pills. (8) Take over-the-counter sleeping pills, without your doctor's knowledge.  Tolerance can develop rapidly with these medications.  Diphenhydramine can have serious side effects for elderly patients. (9) Take daytime naps. (10) Command yourself to go to sleep.  This only makes your mind and body more alert.  If you lie awake for more than 20-30 minutes, get up, go to a different room, participate in a quiet activity (Ex - non-excitable reading or television), and then return to bed when you feel sleepy.  Do this as many times during the night as needed.  This may cause you to have a night or two of poor sleep but it will train your brain to know when it is time for sleep.   Preventive Care 53-34 Years Old, Male Preventive care refers to lifestyle choices and visits with your health care provider that can promote health and wellness. This includes:  A yearly physical exam. This is also called an annual well check.  Regular  dental and eye exams.  Immunizations.  Screening for certain conditions.  Healthy lifestyle choices, such as eating a healthy diet, getting regular exercise, not using drugs or products that contain nicotine and tobacco, and limiting alcohol use. What can I expect for my preventive care visit? Physical exam Your health care provider will check:  Height and weight. These may be used to calculate body mass index (BMI), which is a measurement that tells if you are at a healthy weight.  Heart rate and blood pressure.  Your skin for  abnormal spots. Counseling Your health care provider may ask you questions about:  Alcohol, tobacco, and drug use.  Emotional well-being.  Home and relationship well-being.  Sexual activity.  Eating habits.  Work and work Statistician. What immunizations do I need?  Influenza (flu) vaccine  This is recommended every year. Tetanus, diphtheria, and pertussis (Tdap) vaccine  You may need a Td booster every 10 years. Varicella (chickenpox) vaccine  You may need this vaccine if you have not already been vaccinated. Zoster (shingles) vaccine  You may need this after age 69. Measles, mumps, and rubella (MMR) vaccine  You may need at least one dose of MMR if you were born in 1957 or later. You may also need a second dose. Pneumococcal conjugate (PCV13) vaccine  You may need this if you have certain conditions and were not previously vaccinated. Pneumococcal polysaccharide (PPSV23) vaccine  You may need one or two doses if you smoke cigarettes or if you have certain conditions. Meningococcal conjugate (MenACWY) vaccine  You may need this if you have certain conditions. Hepatitis A vaccine  You may need this if you have certain conditions or if you travel or work in places where you may be exposed to hepatitis A. Hepatitis B vaccine  You may need this if you have certain conditions or if you travel or work in places where you may be exposed to hepatitis B. Haemophilus influenzae type b (Hib) vaccine  You may need this if you have certain risk factors. Human papillomavirus (HPV) vaccine  If recommended by your health care provider, you may need three doses over 6 months. You may receive vaccines as individual doses or as more than one vaccine together in one shot (combination vaccines). Talk with your health care provider about the risks and benefits of combination vaccines. What tests do I need? Blood tests  Lipid and cholesterol levels. These may be checked every 5  years, or more frequently if you are over 23 years old.  Hepatitis C test.  Hepatitis B test. Screening  Lung cancer screening. You may have this screening every year starting at age 41 if you have a 30-pack-year history of smoking and currently smoke or have quit within the past 15 years.  Prostate cancer screening. Recommendations will vary depending on your family history and other risks.  Colorectal cancer screening. All adults should have this screening starting at age 62 and continuing until age 26. Your health care provider may recommend screening at age 30 if you are at increased risk. You will have tests every 1-10 years, depending on your results and the type of screening test.  Diabetes screening. This is done by checking your blood sugar (glucose) after you have not eaten for a while (fasting). You may have this done every 1-3 years.  Sexually transmitted disease (STD) testing. Follow these instructions at home: Eating and drinking  Eat a diet that includes fresh fruits and vegetables, whole grains, lean protein, and  low-fat dairy products.  Take vitamin and mineral supplements as recommended by your health care provider.  Do not drink alcohol if your health care provider tells you not to drink.  If you drink alcohol: ? Limit how much you have to 0-2 drinks a day. ? Be aware of how much alcohol is in your drink. In the [place] one drink equals one 12 oz bottle of beer (355 mL), one 5 oz glass of wine (148 mL), or one 1 oz glass of hard liquor (44 mL). Lifestyle  Take daily care of your teeth and gums.  Stay active. Exercise for at least 30 minutes on 5 or more days each week.  Do not use any products that contain nicotine or tobacco, such as cigarettes, e-cigarettes, and chewing tobacco. If you need help quitting, ask your health care provider.  If you are sexually active, practice safe sex. Use a condom or other form of protection to prevent STIs (sexually transmitted  infections).  Talk with your health care provider about taking a low-dose aspirin every day starting at age 45. What's next?  Go to your health care provider once a year for a well check visit.  Ask your health care provider how often you should have your eyes and teeth checked.  Stay up to date on all vaccines. This information is not intended to replace advice given to you by your health care provider. Make sure you discuss any questions you have with your health care provider. Document Revised: 12/13/2017 Document Reviewed: 12/13/2017 Elsevier Patient Education  2020 [employer].

## 2019-05-02 NOTE — Progress Notes (Signed)
Patient presents to clinic today for annual exam.  Patient is fasting for labs.  Is working hard on trying to maintain a well-balanced diet. Exercise is limited to due balance issues as a residual from prior stroke.   Health Maintenance: Immunizations -- UTD Colonoscopy -- UTD  Past Medical History:  Diagnosis Date  . Anxiety   . Atrial arrhythmia   . Barrett esophagus   . Bipolar 1 disorder (HCC)   . Cavernous angioma   . Depression   . History of chicken pox   . Hypertension   . Seizures (HCC)    1 in 2005, 1 in 2016   . Sleep apnea   . Sleep apnea   . Stroke (HCC) 11/2014   No residual deficits  . Venous malformation    Brain    Past Surgical History:  Procedure Laterality Date  . APPENDECTOMY  1970   age 69  . bone spur     age 26  . kidney stones    . RAD ONC MR BRAIN GAMMA KNIFE  12/05/2016  . TONSILLECTOMY  1968  . UMBILICAL HERNIA REPAIR N/A 07/24/2018   Procedure: HERNIA REPAIR UMBILICAL ADULT;  Surgeon: Almond Lint, MD;  Location: [facility];  Service: General;  Laterality: N/A;    Current Outpatient Medications on File Prior to Visit  Medication Sig Dispense Refill  . atorvastatin (LIPITOR) 20 MG tablet TAKE 1 TABLET BY MOUTH EVERY DAY 90 tablet 1  . buPROPion (WELLBUTRIN XL) 150 MG 24 hr tablet Take 1 tablet (150 mg total) by mouth daily. 90 tablet 1  . cariprazine (VRAYLAR) capsule Take 1 capsule (1.5 mg total) by mouth daily. 90 capsule 1  . diazepam (VALIUM) 5 MG tablet Take 1 tablet (5 mg total) by mouth daily as needed for anxiety. (Patient taking differently: Take 5 mg by mouth daily. ) 90 tablet 1  . lamoTRIgine (LAMICTAL) 100 MG tablet Take 1 tablet (100 mg total) by mouth 2 (two) times daily. Appointment needed for further refills. 60 tablet 0  . lisinopril-hydrochlorothiazide (ZESTORETIC) 20-25 MG tablet TAKE 1 TABLET BY MOUTH EVERY DAY. PLEASE SCHEDULE PHYSICAL. 30 tablet 0  . meclizine (MEDI-MECLIZINE) 25 MG tablet Take 1 tablet (25 mg  total) by mouth 3 (three) times daily as needed for dizziness. 60 tablet 6  . ondansetron (ZOFRAN-ODT) 4 MG disintegrating tablet Take 1-2 tablets (4-8 mg total) by mouth every 8 (eight) hours as needed. 90 tablet 6  . zaleplon (SONATA) 5 MG capsule Take 1 capsule (5 mg total) by mouth at bedtime as needed. for sleep 30 capsule 1   No current facility-administered medications on file prior to visit.    No Known Allergies  Family History  Problem Relation Age of Onset  . Aneurysm Maternal Grandmother   . Asthma Mother   . Diabetes Mother   . Hypertension Mother   . Diabetes Brother   . Diabetes Brother   . Pancreatic cancer Father   . Diabetes Father   . Cancer Father        Pancreatic  . Diabetes Paternal Grandmother   . Diabetes Paternal Grandfather   . Colon cancer Neg Hx     Social History   Socioeconomic History  . Marital status: Married    Spouse name: Misty Stanley   . Number of children: 3  . Years of education: 16+  . Highest education level: Not on file  Occupational History  . 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  Smoking status: Never Smoker  . Smokeless tobacco: Never Used  Substance and Sexual Activity  . Alcohol use: Yes    Alcohol/week: 4.0 standard drinks    Types: 4 Standard drinks or equivalent per week    Comment: was 4-5 beers/week  . Drug use: No  . Sexual activity: Yes  Other Topics Concern  . Not on file  Social History Narrative   Lives in [place].    Married. 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      Caffeine use: 2 cup coffee per day   Social Determinants of Health   Financial Resource Strain:   . Difficulty of Paying Living Expenses:   Food Insecurity:   . Worried About Programme researcher, broadcasting/film/video in the Last Year:   . Barista in the Last Year:   Transportation Needs:   . Freight forwarder (Medical):   Marland Kitchen Lack of Transportation (Non-Medical):   Physical Activity:   . Days of  Exercise per Week:   . Minutes of Exercise per Session:   Stress:   . Feeling of Stress :   Social Connections:   . Frequency of Communication with Friends and Family:   . Frequency of Social Gatherings with Friends and Family:   . Attends Religious Services:   . Active Member of Clubs or Organizations:   . Attends Banker Meetings:   Marland Kitchen Marital Status:   Intimate Partner Violence:   . Fear of Current or Ex-Partner:   . Emotionally Abused:   Marland Kitchen Physically Abused:   . Sexually Abused:    ROS Pertinent ROS are listed in the HPI  BP 120/82   Pulse 68   Temp 98.2 F (36.8 C) (Temporal)   Resp 16   Ht 5\' 9"  (1.753 m)   Wt 228 lb (103.4 kg)   SpO2 98%   BMI 33.67 kg/m   Physical Exam Vitals reviewed.  Constitutional:      General: He is not in acute distress.    Appearance: He is well-developed. He is not diaphoretic.  HENT:     Head: Normocephalic and atraumatic.     Right Ear: Tympanic membrane, ear canal and external ear normal.     Left Ear: Tympanic membrane, ear canal and external ear normal.     Nose: Nose normal.     Mouth/Throat:     Pharynx: No posterior oropharyngeal erythema.  Eyes:     Conjunctiva/sclera: Conjunctivae normal.     Pupils: Pupils are equal, round, and reactive to light.  Neck:     Thyroid: No thyromegaly.  Cardiovascular:     Rate and Rhythm: Normal rate and regular rhythm.     Heart sounds: Normal heart sounds.  Pulmonary:     Effort: Pulmonary effort is normal. No respiratory distress.     Breath sounds: Normal breath sounds. No wheezing or rales.  Chest:     Chest wall: No tenderness.  Abdominal:     General: Bowel sounds are normal. There is no distension.     Palpations: Abdomen is soft. There is no mass.     Tenderness: There is no abdominal tenderness. There is no guarding or rebound.  Musculoskeletal:     Cervical back: Neck supple.  Lymphadenopathy:     Cervical: No cervical adenopathy.  Skin:    General: Skin  is warm and dry.     Findings: No rash.  Neurological:  Mental Status: He is alert and oriented to person, place, and time.     Cranial Nerves: No cranial nerve deficit.     Assessment/Plan: 1. Visit for preventive health examination Depression screen negative. Health Maintenance reviewed. Preventive schedule discussed and handout given in AVS. Will obtain fasting labs today.  - CBC with Differential/Platelet; Future   2. Prostate cancer screening The natural history of prostate cancer and ongoing controversy regarding screening and potential treatment outcomes of prostate cancer has been discussed with the patient. He indicates understanding of the limitations of this screening test and wishes to proceed with screening PSA testing.  - PSA; Future  3. Obesity (BMI 30-39.9) Reviewed calorie counting with patient. Recommend starting to count just to get a 48 hour food journal so he can see how much extra calories from snacking he is getting in. Dietary and exercise recommendations reviewed. Labs per orders - Comprehensive metabolic panel; Future - Hemoglobin A1c; Future - Lipid panel; Future - TSH; Future - Vitamin D (25 hydroxy); Future  4. Essential hypertension Stable. Asymptomatic. Continue current regimen. Labs today. - Comprehensive metabolic panel; Future - Hemoglobin A1c; Future - Lipid panel; Future  5. OSA on CPAP Compliant with CPAP therapy.   6. Insomnia due to medical condition Reviewed sleep hygiene practices with patient. Start with nightly Melatonin or Pure ZZZs. Follow-up as needed.   This visit occurred during the SARS-CoV-2 public health emergency.  Safety protocols were in place, including screening questions prior to the visit, additional usage of staff PPE, and extensive cleaning of exam room while observing appropriate contact time as indicated for disinfecting solutions.     Piedad Climes, PA-C

## 2019-05-03 LAB — COMPREHENSIVE METABOLIC PANEL
AG Ratio: 2.5 (calc) (ref 1.0–2.5)
ALT: 19 U/L (ref 9–46)
AST: 16 U/L (ref 10–35)
Albumin: 4.8 g/dL (ref 3.6–5.1)
Alkaline phosphatase (APISO): 58 U/L (ref 35–144)
BUN: 17 mg/dL (ref 7–25)
CO2: 22 mmol/L (ref 20–32)
Calcium: 10.4 mg/dL — ABNORMAL HIGH (ref 8.6–10.3)
Chloride: 101 mmol/L (ref 98–110)
Creat: 0.99 mg/dL (ref 0.70–1.25)
Globulin: 1.9 g/dL (calc) (ref 1.9–3.7)
Glucose, Bld: 92 mg/dL (ref 65–99)
Potassium: 4.2 mmol/L (ref 3.5–5.3)
Sodium: 138 mmol/L (ref 135–146)
Total Bilirubin: 0.5 mg/dL (ref 0.2–1.2)
Total Protein: 6.7 g/dL (ref 6.1–8.1)

## 2019-05-03 LAB — CBC WITH DIFFERENTIAL/PLATELET
Absolute Monocytes: 873 cells/uL (ref 200–950)
Basophils Absolute: 94 cells/uL (ref 0–200)
Basophils Relative: 0.8 %
Eosinophils Absolute: 283 cells/uL (ref 15–500)
Eosinophils Relative: 2.4 %
HCT: 44.7 % (ref 38.5–50.0)
Hemoglobin: 15.5 g/dL (ref 13.2–17.1)
Lymphs Abs: 1805 cells/uL (ref 850–3900)
MCH: 30.5 pg (ref 27.0–33.0)
MCHC: 34.7 g/dL (ref 32.0–36.0)
MCV: 87.8 fL (ref 80.0–100.0)
MPV: 12 fL (ref 7.5–12.5)
Monocytes Relative: 7.4 %
Neutro Abs: 8744 cells/uL — ABNORMAL HIGH (ref 1500–7800)
Neutrophils Relative %: 74.1 %
Platelets: 216 10*3/uL (ref 140–400)
RBC: 5.09 10*6/uL (ref 4.20–5.80)
RDW: 13.1 % (ref 11.0–15.0)
Total Lymphocyte: 15.3 %
WBC: 11.8 10*3/uL — ABNORMAL HIGH (ref 3.8–10.8)

## 2019-05-03 LAB — LIPID PANEL
Cholesterol: 156 mg/dL (ref <200)
HDL: 37 mg/dL — ABNORMAL LOW (ref >=40)
LDL Cholesterol (Calc): 82 mg/dL (calc)
Non-HDL Cholesterol (Calc): 119 mg/dL (calc) (ref <130)
Total CHOL/HDL Ratio: 4.2 (calc) (ref <5.0)
Triglycerides: 279 mg/dL — ABNORMAL HIGH (ref <150)

## 2019-05-03 LAB — HEMOGLOBIN A1C
Hgb A1c MFr Bld: 5.9 % of total Hgb — ABNORMAL HIGH (ref <5.7)
Mean Plasma Glucose: 123 (calc)
eAG (mmol/L): 6.8 (calc)

## 2019-05-03 LAB — TSH: TSH: 1.29 mIU/L (ref 0.40–4.50)

## 2019-05-03 LAB — VITAMIN D 25 HYDROXY (VIT D DEFICIENCY, FRACTURES): Vit D, 25-Hydroxy: 28 ng/mL — ABNORMAL LOW (ref 30–100)

## 2019-05-03 LAB — PSA: PSA: 1.3 ng/mL (ref <=4.0)

## 2019-05-05 ENCOUNTER — Other Ambulatory Visit: Payer: Self-pay | Admitting: Emergency Medicine

## 2019-05-05 DIAGNOSIS — E785 Hyperlipidemia, unspecified: Secondary | ICD-10-CM

## 2019-05-05 DIAGNOSIS — D72829 Elevated white blood cell count, unspecified: Secondary | ICD-10-CM

## 2019-05-05 MED ORDER — ATORVASTATIN CALCIUM 40 MG PO TABS: 40.0000 mg | ORAL_TABLET | Freq: Every day | ORAL | 1 refills | Status: DC

## 2019-05-13 ENCOUNTER — Encounter: Payer: Self-pay | Admitting: Physician Assistant

## 2019-05-13 DIAGNOSIS — E559 Vitamin D deficiency, unspecified: Secondary | ICD-10-CM | POA: Insufficient documentation

## 2019-05-20 ENCOUNTER — Other Ambulatory Visit: Payer: Self-pay | Admitting: Physician Assistant

## 2019-05-20 DIAGNOSIS — I10 Essential (primary) hypertension: Secondary | ICD-10-CM

## 2019-05-24 ENCOUNTER — Other Ambulatory Visit: Payer: Self-pay | Admitting: Neurology

## 2019-06-07 ENCOUNTER — Other Ambulatory Visit: Payer: Self-pay | Admitting: Neurology

## 2019-06-16 ENCOUNTER — Other Ambulatory Visit: Payer: Self-pay | Admitting: *Deleted

## 2019-06-16 ENCOUNTER — Other Ambulatory Visit: Payer: Self-pay | Admitting: Neurology

## 2019-06-16 ENCOUNTER — Encounter: Payer: Self-pay | Admitting: *Deleted

## 2019-06-16 MED ORDER — MECLIZINE HCL 25 MG PO TABS: 25.0000 mg | ORAL_TABLET | Freq: Three times a day (TID) | ORAL | 0 refills | Status: DC | PRN

## 2019-06-16 NOTE — Telephone Encounter (Signed)
I sent pt a mychart message. Appt is needed for further refills.

## 2019-06-25 ENCOUNTER — Telehealth: Payer: Self-pay | Admitting: Adult Health

## 2019-06-26 ENCOUNTER — Ambulatory Visit: Payer: 59 | Admitting: Psychiatry

## 2019-06-30 ENCOUNTER — Ambulatory Visit: Payer: 59 | Admitting: Psychiatry

## 2019-07-01 ENCOUNTER — Telehealth (INDEPENDENT_AMBULATORY_CARE_PROVIDER_SITE_OTHER): Payer: Managed Care, Other (non HMO) | Admitting: Adult Health

## 2019-07-01 ENCOUNTER — Encounter: Payer: Self-pay | Admitting: Adult Health

## 2019-07-01 DIAGNOSIS — G40209 Localization-related (focal) (partial) symptomatic epilepsy and epileptic syndromes with complex partial seizures, not intractable, without status epilepticus: Secondary | ICD-10-CM

## 2019-07-01 DIAGNOSIS — D1802 Hemangioma of intracranial structures: Secondary | ICD-10-CM

## 2019-07-01 DIAGNOSIS — H814 Vertigo of central origin: Secondary | ICD-10-CM | POA: Diagnosis not present

## 2019-07-01 DIAGNOSIS — I613 Nontraumatic intracerebral hemorrhage in brain stem: Secondary | ICD-10-CM

## 2019-07-01 DIAGNOSIS — G4733 Obstructive sleep apnea (adult) (pediatric): Secondary | ICD-10-CM | POA: Diagnosis not present

## 2019-07-01 MED ORDER — MECLIZINE HCL 25 MG PO TABS: 25.0000 mg | ORAL_TABLET | Freq: Three times a day (TID) | ORAL | 6 refills | Status: DC | PRN

## 2019-07-01 MED ORDER — LAMOTRIGINE 100 MG PO TABS: 100.0000 mg | ORAL_TABLET | Freq: Two times a day (BID) | ORAL | 3 refills | Status: DC
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
## 2019-07-02 ENCOUNTER — Other Ambulatory Visit: Payer: Self-pay

## 2019-07-02 ENCOUNTER — Ambulatory Visit (INDEPENDENT_AMBULATORY_CARE_PROVIDER_SITE_OTHER): Payer: Managed Care, Other (non HMO) | Admitting: Psychiatry

## 2019-07-02 ENCOUNTER — Encounter: Payer: Self-pay | Admitting: Psychiatry

## 2019-07-02 VITALS — Ht 69.0 in | Wt 231.0 lb

## 2019-07-02 DIAGNOSIS — F3173 Bipolar disorder, in partial remission, most recent episode manic: Secondary | ICD-10-CM

## 2019-07-02 DIAGNOSIS — F3132 Bipolar disorder, current episode depressed, moderate: Secondary | ICD-10-CM

## 2019-07-02 DIAGNOSIS — F411 Generalized anxiety disorder: Secondary | ICD-10-CM | POA: Diagnosis not present

## 2019-07-02 DIAGNOSIS — G4733 Obstructive sleep apnea (adult) (pediatric): Secondary | ICD-10-CM

## 2019-07-02 MED ORDER — CARIPRAZINE HCL 1.5 MG PO CAPS: 1.5000 mg | ORAL_CAPSULE | Freq: Every day | ORAL | 1 refills | Status: DC

## 2019-07-02 MED ORDER — DIAZEPAM 5 MG PO TABS: 5.0000 mg | ORAL_TABLET | Freq: Two times a day (BID) | ORAL | 1 refills | Status: DC

## 2019-07-02 MED ORDER — BUPROPION HCL ER (XL) 300 MG PO TB24: 300.0000 mg | ORAL_TABLET | Freq: Every day | ORAL | 1 refills | Status: DC

## 2019-07-02 NOTE — Progress Notes (Signed)
[facility]  Patient ID: Todd Harris,  MRN: [id number]  PCP: Brunetta Jeans, PA-C  Date of Evaluation: 07/02/2019 Time spent:25 minutes from 1630 to [place] Complaint:  Chief Complaint    Depression; Manic Behavior; Anxiety; Trauma      HISTORY/CURRENT STATUS: [name] is seen Onsite in office 25 minutes face-to-face individually with consent with epic collateral for psychiatric interview and exam and 68-month evaluation and management of bipolar, generalized anxiety, and neurological as well as primary psychiatric contributions to sleep disorder.  Patient reviews conclusions from neurology yesterday for his resumption of scheduled Valium for his vertigo and dysmetric dizziness.  As he has been switched to as needed Valium, his dizziness has gotten more consequential and he has less perseverance in activity.  Work is more challenging even though he continues remarkable effort sustaining his job and family function.  He is quite dedicated with his CPAP and takes Sonata infrequently.  However he acknowledges having passive suicidal ideation in a depressive fashion as though melancholically fixated on being better off dead.  He has had no mania in at least 18 months but has significant depression evolving over the last several months.  Wellbutrin has been kept low dose because of his several episodes of mania in the past but Arman Filter is well-established preventing manic symptoms now for that time.  He remains on Lamictal as well both for seizures and bipolar disorder.  All of these symptom origins and management are processed with patient for his confidence and vibration in all aspects of care.  He is not today manic, psychotic or delirious.   Depression             This is a recurrentproblemcurrently in relapse the last several months last manic episode was approximately 1.5 years ago.. The onset quality is sudden. The problem occurs intermittently.The relapse of problem has  been gradually improvingsince last onset.Associated symptoms include decreased concentration, insomnia, fatigue, morbid fixations, boredom, passive suicidal ideation, anhedonia,headachesand sad. Associated symptoms include no hopelessness,with no actual self-harmthough prior suicide attemptThe symptoms are aggravated by work stress, medication and social issues.Past treatments include other medications.Compliance with treatment is good.Past compliance problems include difficulty with treatment plan, medication issues, medical issues and insurance issues.Previous treatment provided moderaterelief.Past medical history includes anxiety.  Anxiety Presents forfollow-upvisit. Symptoms includedecreased concentration,dizziness, inhibitory avoidance,excessive worry,malaise,muscle tensionand nervous/anxious behavior. Patient reports no depressed mood,palpitations, insomniaor suicidal ideas. Symptoms occurmost days. The severity of symptoms isinterfering with daily activities and causing significant distress. The quality of sleep isfair. Nighttime awakenings:occasional.   Individual Medical History/ Review of Systems: Changes? :Yes Neurology appointment yesterday reviewed sleep treatment including CPAP, seizure treatment, neurosurgical treatment of cavernous angiomas, and management of pontine stroke residual patient described being an active displacing or shunting of motor and vestibular function with activity producing fatigue.  Allergies: Patient has no known allergies.  Current Medications:  Current Outpatient Medications:  .  atorvastatin (LIPITOR) 40 MG tablet, Take 1 tablet (40 mg total) by mouth daily., Disp: 90 tablet, Rfl: 1 .  buPROPion (WELLBUTRIN XL) 300 MG 24 hr tablet, Take 1 tablet (300 mg total) by mouth daily after breakfast., Disp: 90 tablet, Rfl: 1 .  cariprazine (VRAYLAR) capsule, Take 1 capsule (1.5 mg total) by mouth daily., Disp: 90 capsule, Rfl: 1 .   diazepam (VALIUM) 5 MG tablet, Take 1 tablet (5 mg total) by mouth 2 (two) times daily., Disp: 180 tablet, Rfl: 1 .  lamoTRIgine (LAMICTAL) 100 MG tablet, Take 1 tablet (100  mg total) by mouth 2 (two) times daily., Disp: 180 tablet, Rfl: 3 .  lisinopril-hydrochlorothiazide (ZESTORETIC) 20-25 MG tablet, TAKE 1 TABLET BY MOUTH EVERY DAY., Disp: 30 tablet, Rfl: 3 .  meclizine (MEDI-MECLIZINE) 25 MG tablet, Take 1 tablet (25 mg total) by mouth 3 (three) times daily as needed for dizziness., Disp: 90 tablet, Rfl: 6 .  zaleplon (SONATA) 5 MG capsule, Take 1 capsule (5 mg total) by mouth at bedtime as needed. for sleep, Disp: 30 capsule, Rfl: 1 Medication Side Effects: none  Family Medical/ Social History: Changes? Yes wife remains encouraged by his effort and resilience.  MENTAL HEALTH EXAM:  Height 5\' 9"  (1.753 m), weight 231 lb (104.8 kg).Body mass index is 34.11 kg/m.  AIMS = 0.  General Appearance: Casual, Fairly Groomed, Guarded and Obese  Eye Contact:  Good  Speech:  Clear and Coherent, Normal Rate and Talkative  Volume:  Normal  Mood:  Anxious, Depressed, Dysphoric and Hopeless  Affect:  Depressed, Inappropriate, Restricted and Anxious  Thought Process:  Coherent, Goal Directed, Irrelevant and Descriptions of Associations: Tangential  Orientation:  Full (Time, Place, and Person)  Thought Content: Rumination and Tangential   Suicidal Thoughts:  Yes.  without intent/plan  Homicidal Thoughts:  No  Memory:  Immediate;   Good and Fair Remote;   Good and Fair  Judgement:  Good  Insight:  Fair  Psychomotor Activity:  Normal, Decreased, Mannerisms and Psychomotor Retardation  Concentration:  Concentration: Fair and Attention Span: Fair  Recall:  [employer] of Knowledge: Good  Language: Good  Assets:  Desire for Improvement Resilience Talents/Skills Vocational/Educational  ADL's:  Intact  Cognition: WNL  Prognosis:  Fair    DIAGNOSES:    ICD-10-CM   1. Bipolar I disorder,  moderate, current or most recent episode depressed, with anxious distress ([place])  F31.32   2. GAD (generalized anxiety disorder)  F41.1 buPROPion (WELLBUTRIN XL) 300 MG 24 hr tablet    diazepam (VALIUM) 5 MG tablet  3. Obstructive sleep apnea  G47.33   4. Bipolar I disorder, current or most recent episode manic, in partial remission with anxious distress ([facility])  F31.73 buPROPion (WELLBUTRIN XL) 300 MG 24 hr tablet    cariprazine (VRAYLAR) capsule    diazepam (VALIUM) 5 MG tablet    Receiving Psychotherapy: No    RECOMMENDATIONS: Deon requires that we maintain his 100-month schedule of psychiatric follow-up as he manages neurology, sleep order treatment, and neurosurgery change to 5 mg twice daily sent as #180 with 1 refill to [facility] for generalized anxiety and residual stroke.  His Wellbutrin is increased to 300 mg XL every morning sent as #90 with 1 refill to [facility] for bipolar depression and generalized anxiety.  His Vraylar is maintained at 1.5 mg every morning sent as #90 with 1 refill to [facility] for bipolar disorder.  He is updated on prevention and monitoring safety hygiene with crisis plans including for the passive suicidal ideation processed and addressed for coping at length.  He returns for follow-up in 6 months or sooner if needed.  Delight Hoh, MD

## 2019-07-04 ENCOUNTER — Ambulatory Visit (INDEPENDENT_AMBULATORY_CARE_PROVIDER_SITE_OTHER): Payer: Managed Care, Other (non HMO)

## 2019-07-04 ENCOUNTER — Other Ambulatory Visit: Payer: Self-pay

## 2019-07-04 DIAGNOSIS — E785 Hyperlipidemia, unspecified: Secondary | ICD-10-CM | POA: Diagnosis not present

## 2019-07-04 DIAGNOSIS — D72829 Elevated white blood cell count, unspecified: Secondary | ICD-10-CM | POA: Diagnosis not present

## 2019-07-04 LAB — LIPID PANEL
Cholesterol: 130 mg/dL (ref 0–200)
HDL: 34.2 mg/dL — ABNORMAL LOW (ref >39.00)
NonHDL: 96.03
Total CHOL/HDL Ratio: 4
Triglycerides: 206 mg/dL — ABNORMAL HIGH (ref 0.0–149.0)
VLDL: 41.2 mg/dL — ABNORMAL HIGH (ref 0.0–40.0)

## 2019-07-04 LAB — CBC WITH DIFFERENTIAL/PLATELET
Basophils Absolute: 0 10*3/uL (ref 0.0–0.1)
Basophils Relative: 0.5 % (ref 0.0–3.0)
Eosinophils Absolute: 0.3 10*3/uL (ref 0.0–0.7)
Eosinophils Relative: 3.4 % (ref 0.0–5.0)
HCT: 42.9 % (ref 39.0–52.0)
Hemoglobin: 14.5 g/dL (ref 13.0–17.0)
Lymphocytes Relative: 18.1 % (ref 12.0–46.0)
Lymphs Abs: 1.5 10*3/uL (ref 0.7–4.0)
MCHC: 33.9 g/dL (ref 30.0–36.0)
MCV: 90.1 fl (ref 78.0–100.0)
Monocytes Absolute: 0.7 10*3/uL (ref 0.1–1.0)
Monocytes Relative: 8.3 % (ref 3.0–12.0)
Neutro Abs: 5.9 10*3/uL (ref 1.4–7.7)
Neutrophils Relative %: 69.7 % (ref 43.0–77.0)
Platelets: 181 10*3/uL (ref 150.0–400.0)
RBC: 4.76 Mil/uL (ref 4.22–5.81)
RDW: 13.4 % (ref 11.5–15.5)
WBC: 8.5 10*3/uL (ref 4.0–10.5)

## 2019-07-04 LAB — HEPATIC FUNCTION PANEL
ALT: 32 U/L (ref 0–53)
AST: 18 U/L (ref 0–37)
Albumin: 4.6 g/dL (ref 3.5–5.2)
Alkaline Phosphatase: 58 U/L (ref 39–117)
Bilirubin, Direct: 0.1 mg/dL (ref 0.0–0.3)
Total Bilirubin: 0.4 mg/dL (ref 0.2–1.2)
Total Protein: 6.3 g/dL (ref 6.0–8.3)

## 2019-07-04 LAB — LDL CHOLESTEROL, DIRECT: Direct LDL: 58 mg/dL

## 2019-07-06 ENCOUNTER — Encounter: Payer: Self-pay | Admitting: Physician Assistant

## 2019-07-09 MED ORDER — ICOSAPENT ETHYL 1 G PO CAPS: 2.0000 g | ORAL_CAPSULE | Freq: Two times a day (BID) | ORAL | 1 refills | Status: DC

## 2019-07-09 NOTE — Addendum Note (Signed)
Addended by: Brunetta Jeans on: 07/09/2019 09:16 AM   Modules accepted: Orders

## 2019-07-24 ENCOUNTER — Other Ambulatory Visit: Payer: Self-pay | Admitting: Psychiatry

## 2019-07-24 DIAGNOSIS — F3173 Bipolar disorder, in partial remission, most recent episode manic: Secondary | ICD-10-CM

## 2019-07-24 DIAGNOSIS — F411 Generalized anxiety disorder: Secondary | ICD-10-CM

## 2019-08-31 ENCOUNTER — Other Ambulatory Visit: Payer: Self-pay | Admitting: Physician Assistant

## 2019-09-06 ENCOUNTER — Other Ambulatory Visit: Payer: Self-pay | Admitting: Physician Assistant

## 2019-09-06 DIAGNOSIS — E785 Hyperlipidemia, unspecified: Secondary | ICD-10-CM

## 2019-09-09 NOTE — Telephone Encounter (Signed)
Please advise what dose pt should be on.  

## 2019-09-10 ENCOUNTER — Other Ambulatory Visit: Payer: Self-pay | Admitting: Physician Assistant

## 2019-09-10 DIAGNOSIS — E785 Hyperlipidemia, unspecified: Secondary | ICD-10-CM

## 2019-09-17 MED ORDER — ATORVASTATIN CALCIUM 40 MG PO TABS: 40.0000 mg | ORAL_TABLET | Freq: Every day | ORAL | 1 refills | Status: DC

## 2019-09-25 ENCOUNTER — Other Ambulatory Visit: Payer: Self-pay | Admitting: Physician Assistant

## 2019-09-25 DIAGNOSIS — I10 Essential (primary) hypertension: Secondary | ICD-10-CM

## 2019-09-30 ENCOUNTER — Telehealth: Payer: Self-pay | Admitting: Psychiatry

## 2019-09-30 ENCOUNTER — Other Ambulatory Visit: Payer: Self-pay

## 2019-09-30 DIAGNOSIS — F3173 Bipolar disorder, in partial remission, most recent episode manic: Secondary | ICD-10-CM

## 2019-09-30 DIAGNOSIS — F411 Generalized anxiety disorder: Secondary | ICD-10-CM

## 2019-09-30 MED ORDER — DIAZEPAM 5 MG PO TABS: 5.0000 mg | ORAL_TABLET | Freq: Two times a day (BID) | ORAL | 1 refills | Status: DC

## 2019-09-30 NOTE — Telephone Encounter (Signed)
[place] registry documents 06/07/2019 dispensing of #180 of the Valium 5 mg from the refill on 12/23/2018 eScription.  The [facility] has lost or not found the 07/02/2019 Valium 5 mg twice daily #180 with 1 refill so that we replace that with certainty from [facility] registry for the last year not dispensed so that #180 with 1 refill is acceptable with security no contraindication in the last year.

## 2019-09-30 NOTE — Telephone Encounter (Signed)
Pharmacy called about message left for pt about Diazepam Rx. Has one with different instructions need to talk with nurse. [phone] [facility]

## 2019-09-30 NOTE — Telephone Encounter (Signed)
Please review rx

## 2019-10-23 ENCOUNTER — Encounter: Payer: Self-pay | Admitting: Psychiatry

## 2019-10-26 ENCOUNTER — Other Ambulatory Visit: Payer: Self-pay | Admitting: Physician Assistant

## 2019-11-16 IMAGING — CT CT ABDOMEN AND PELVIS WITH CONTRAST
2 of 5 series · 16 of 46 positions shown, 18 images · IV contrast (ISOVUE)
Comparison: CT of the abdomen pelvis dated 10/10/2013

CLINICAL DATA: 58-year-old male with possible strangulated
umbilical hernia.

EXAM:
CT ABDOMEN AND PELVIS WITH CONTRAST
TECHNIQUE: Multidetector CT imaging of the abdomen and pelvis was performed
using the standard protocol following bolus administration of
intravenous contrast.
CONTRAST:  100mL OMNIPAQUE IOHEXOL 300 MG/ML  SOLN

[Series 2: axial st · axial · 0.88mm/px · z∈[+886,+1356]mm · 13 of 110 slices shown, 15 images]
[im 8/110  soft-tissue]
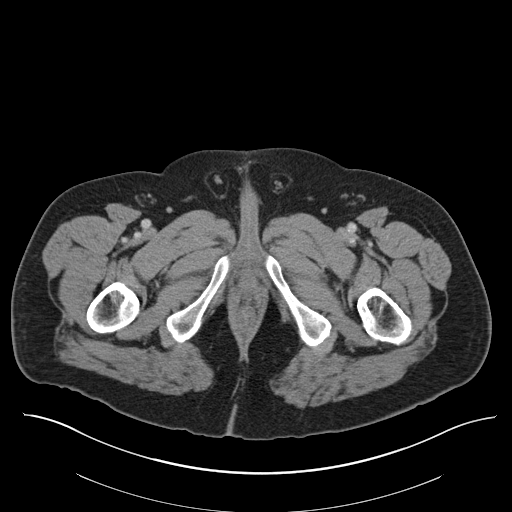
[im 8/110  bone]
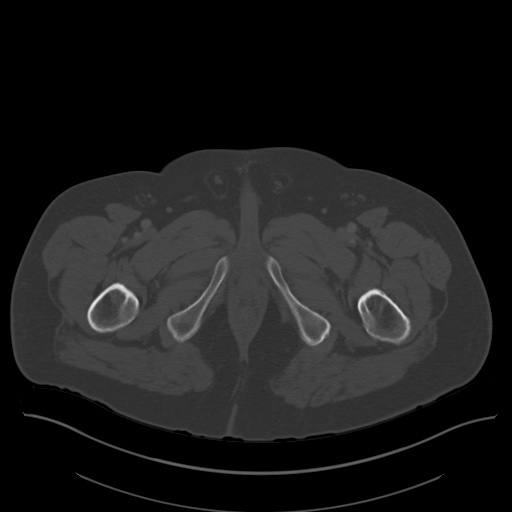
[im 16/110  soft-tissue]
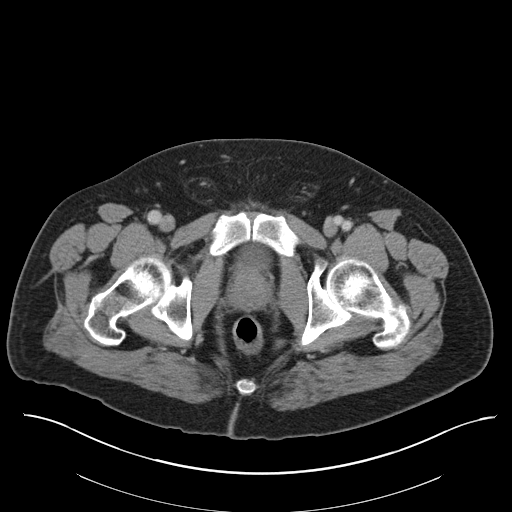
[im 24/110  soft-tissue]
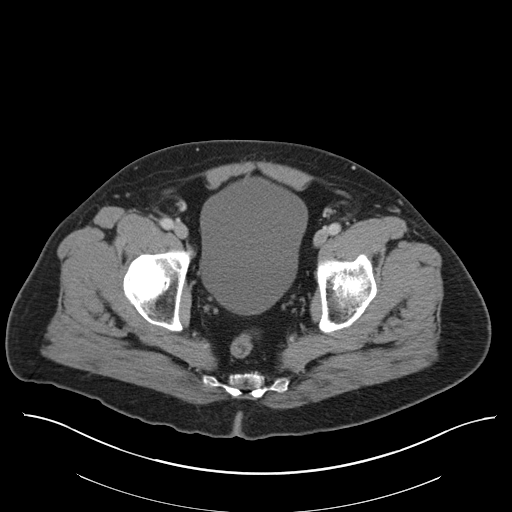
[im 32/110  soft-tissue]
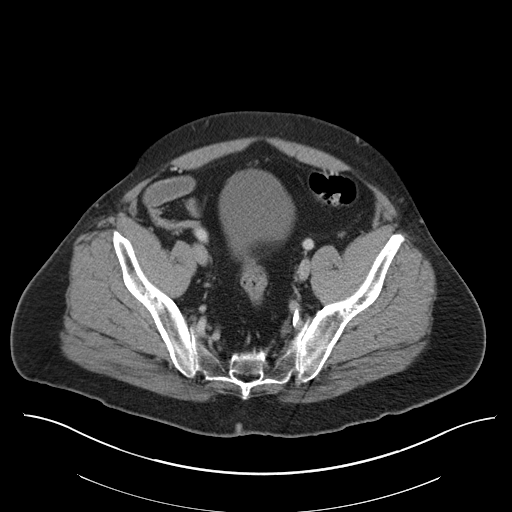
[im 39/110  soft-tissue]
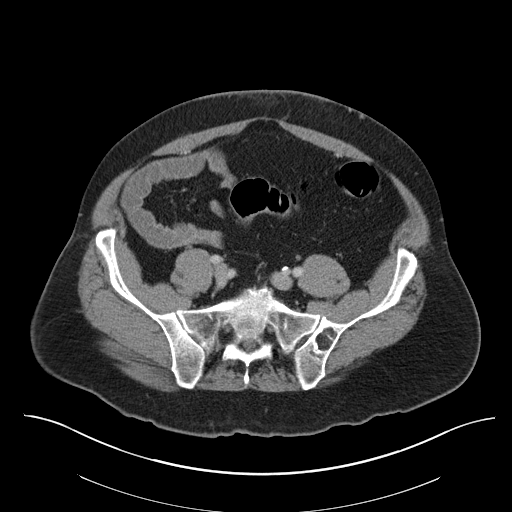
[im 47/110  soft-tissue]
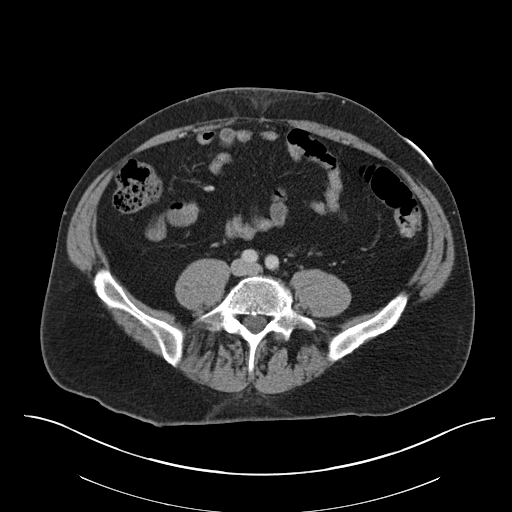
[im 55/110  soft-tissue]
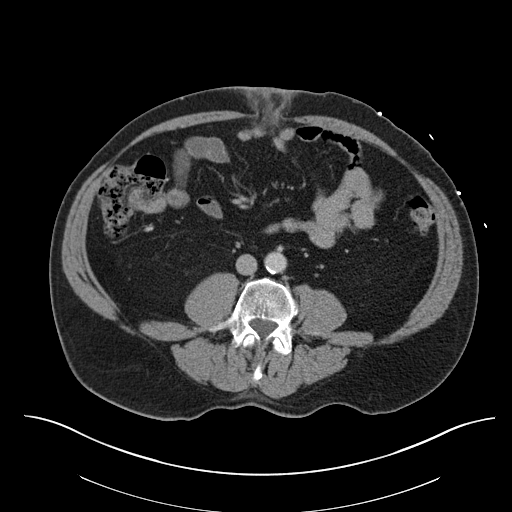
[im 63/110  soft-tissue]
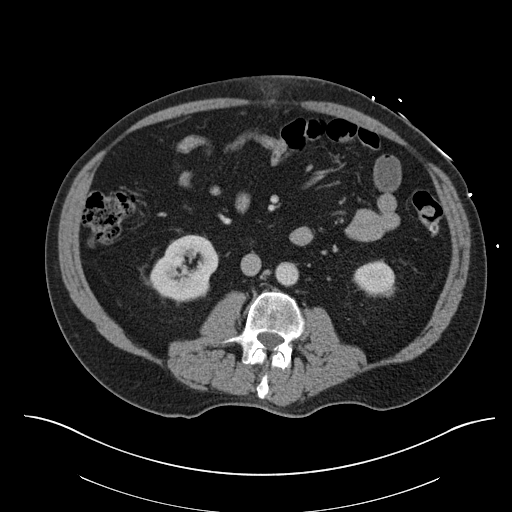
[im 71/110  soft-tissue]
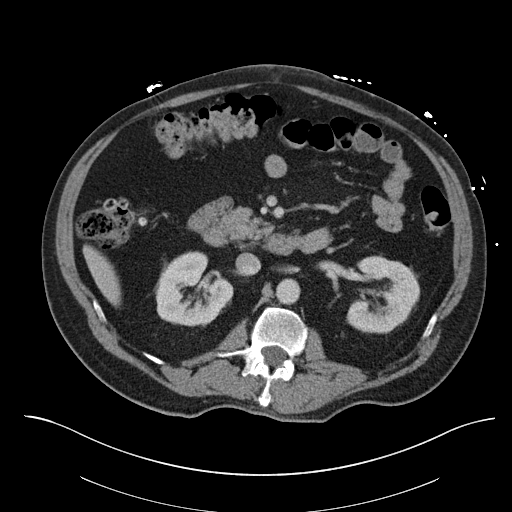
[im 71/110  bone]
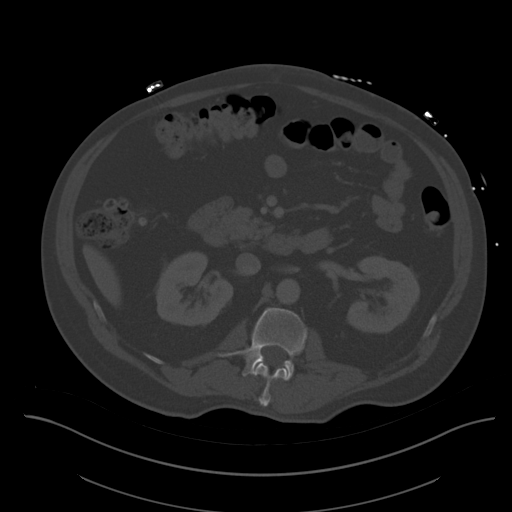
[im 78/110  soft-tissue]
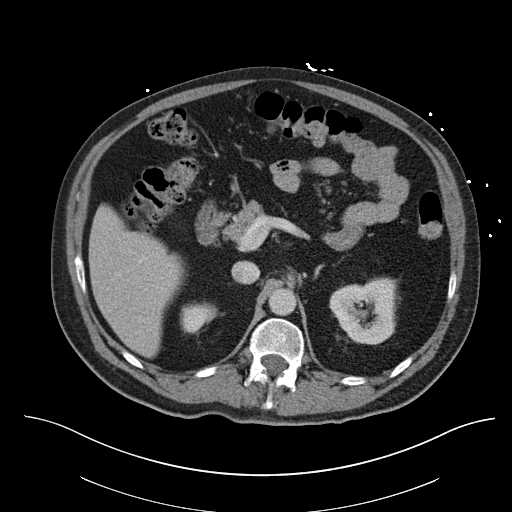
[im 86/110  soft-tissue]
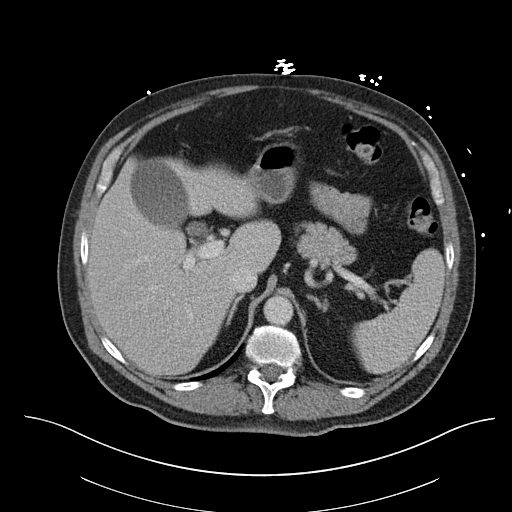
[im 94/110  soft-tissue]
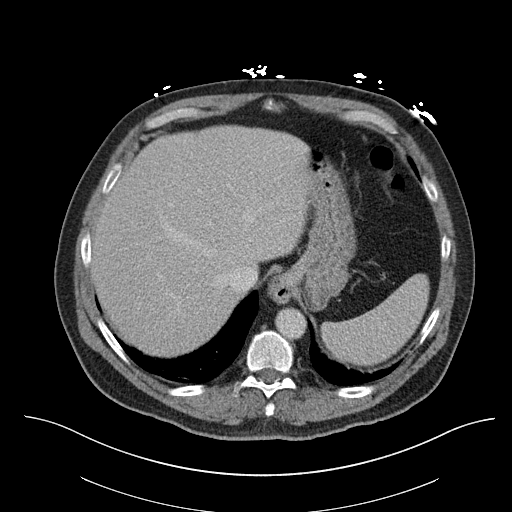
[im 102/110  soft-tissue]
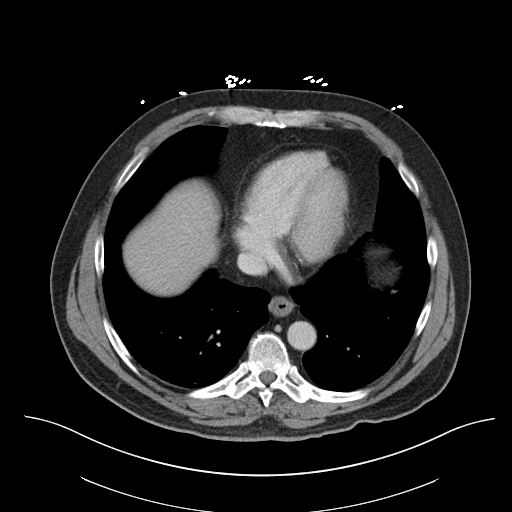

[Series 5: coronal st · coronal · 0.86mm/px · 3 of 166 slices shown]
[im 56/166  soft-tissue]
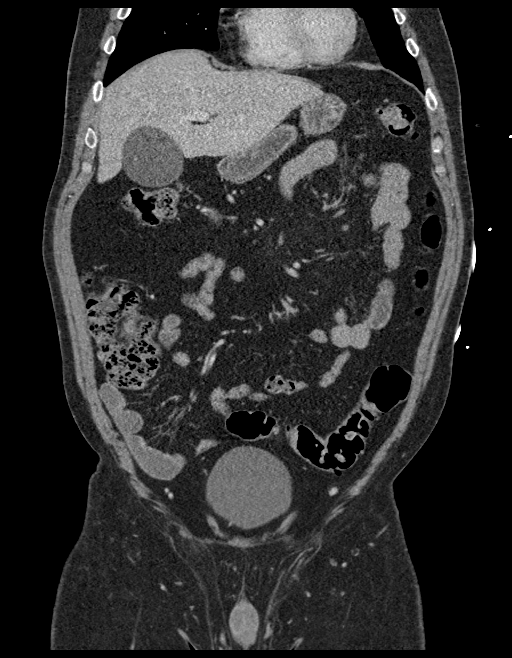
[im 74/166  soft-tissue]
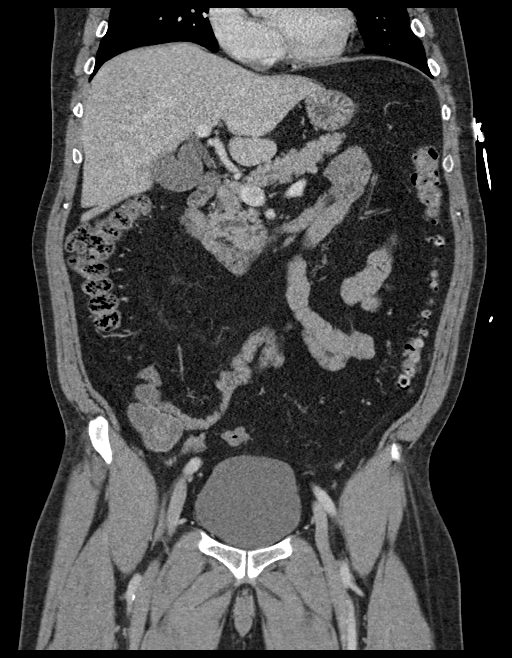
[im 92/166  soft-tissue]
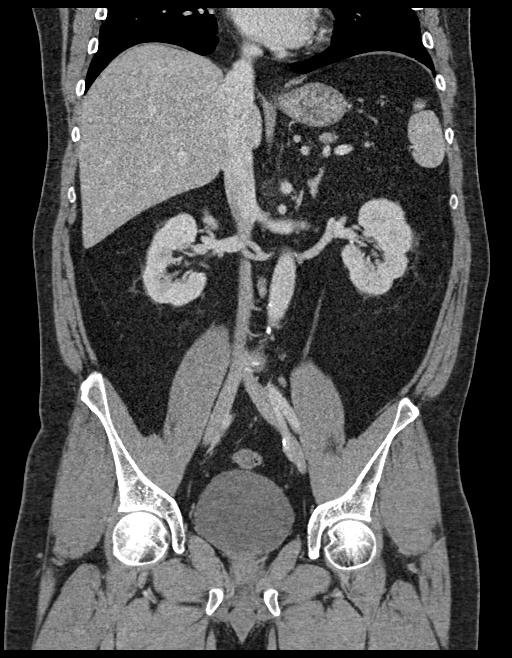

[16 of 46 positions shown; findings below may reference images not displayed]

FINDINGS: Lower chest: Minimal bibasilar dependent atelectatic changes. The
visualized lung bases are otherwise clear.

No intra-abdominal free air or free fluid.

Hepatobiliary: No focal liver abnormality is seen. No gallstones,
gallbladder wall thickening, or biliary dilatation.

Pancreas: Unremarkable. No pancreatic ductal dilatation or
surrounding inflammatory changes.

Spleen: Normal in size without focal abnormality.

Adrenals/Urinary Tract: Adrenal glands are unremarkable. Kidneys are
normal, without renal calculi, focal lesion, or hydronephrosis.
Bladder is unremarkable.

Stomach/Bowel: There are scattered colonic diverticula without
active inflammatory changes. There is no bowel obstruction or active
inflammation. Appendectomy.

Vascular/Lymphatic: Mild aortoiliac atherosclerotic disease. No
portal venous gas. There is no adenopathy.

Reproductive: The prostate and seminal vesicles are grossly
unremarkable. No pelvic mass.

Other: There is a small fat containing umbilical hernia. The neck of
the hernia measures 2.5 cm in diameter. There is inflammatory
changes of the herniated fat and surrounding subcutaneous soft
tissues suggestive of strangulation or incarceration. Clinical
correlation is recommended. No drainable fluid collection
identified. There is no herniation of bowel.

Musculoskeletal: No acute or significant osseous findings.
IMPRESSION: 1. Small fat containing umbilical hernia with inflammatory changes
suggestive of strangulation or incarceration. Clinical correlation
is recommended. No fluid collection.
2. Colonic diverticulosis.
3. Five aortic Atherosclerosis ([id number]).

## 2019-12-19 ENCOUNTER — Other Ambulatory Visit: Payer: Self-pay | Admitting: Psychiatry

## 2019-12-19 DIAGNOSIS — F3173 Bipolar disorder, in partial remission, most recent episode manic: Secondary | ICD-10-CM

## 2019-12-19 DIAGNOSIS — F411 Generalized anxiety disorder: Secondary | ICD-10-CM

## 2019-12-30 ENCOUNTER — Ambulatory Visit: Payer: Managed Care, Other (non HMO) | Admitting: Psychiatry

## 2020-01-05 ENCOUNTER — Other Ambulatory Visit: Payer: Self-pay | Admitting: Physician Assistant

## 2020-01-08 ENCOUNTER — Encounter: Payer: Self-pay | Admitting: Psychiatry

## 2020-01-08 ENCOUNTER — Other Ambulatory Visit: Payer: Self-pay

## 2020-01-08 ENCOUNTER — Ambulatory Visit (INDEPENDENT_AMBULATORY_CARE_PROVIDER_SITE_OTHER): Payer: BC Managed Care – PPO | Admitting: Psychiatry

## 2020-01-08 VITALS — Ht 69.0 in | Wt 227.0 lb

## 2020-01-08 DIAGNOSIS — G4733 Obstructive sleep apnea (adult) (pediatric): Secondary | ICD-10-CM

## 2020-01-08 DIAGNOSIS — F3132 Bipolar disorder, current episode depressed, moderate: Secondary | ICD-10-CM | POA: Diagnosis not present

## 2020-01-08 DIAGNOSIS — F3173 Bipolar disorder, in partial remission, most recent episode manic: Secondary | ICD-10-CM

## 2020-01-08 DIAGNOSIS — F411 Generalized anxiety disorder: Secondary | ICD-10-CM | POA: Diagnosis not present

## 2020-01-08 MED ORDER — CARIPRAZINE HCL 1.5 MG PO CAPS: 1.5000 mg | ORAL_CAPSULE | Freq: Every day | ORAL | 1 refills | Status: DC

## 2020-01-08 MED ORDER — BUPROPION HCL ER (XL) 300 MG PO TB24: ORAL_TABLET | ORAL | 1 refills | Status: DC

## 2020-01-08 MED ORDER — DIAZEPAM 5 MG PO TABS: 5.0000 mg | ORAL_TABLET | Freq: Two times a day (BID) | ORAL | 1 refills | Status: DC

## 2020-01-08 NOTE — Progress Notes (Signed)
[facility]  Patient ID: Todd Harris,  MRN: [id number]  PCP: Waldon Merl, PA-C  Date of Evaluation: 01/08/2020 Time spent:20 minutes from 1320 to 1340  Chief Complaint:  Chief Complaint    Depression; Manic Behavior; Anxiety      HISTORY/CURRENT STATUS: Todd Harris is seen onsite in office 20 minutes face-to-face individually with consent with epic collateral for psychiatric interview and exam in 24-month evaluation and management of bipolar disorder, generalized anxiety, and obstructive sleep apnea, comorbid with major neurovascular and epileptiform disorders.  At last appointment, patient was having somewhat more difficulty with vertigo status post pontine stroke but in the interim has been working from home, drives okay with Valium daily for vertigo, and finds all of his medications stabilizing and supportive of his responsibilities and prevention of other decompensations.  He has not seen his neurologist in the last 6 months but does continue his Lamictal and as needed Sonata while having CPAP nightly for 9 hours.  He has not required meclizine.  The Wellbutrin and Vraylar have both helpful for mood and focus.  [place] registry documents last Valium dispensing 09/30/2019.  His Wellbutrin was increased as of last appointment 07/02/2019 and that was helpful to mood and focus as well as anxiety. He processes my imminent retirement and case closure today for transition transfer to Corie Chiquito, PMHNP in this office.  He has no mania, suicidality, psychosis or delirium.  Depression This is a recurrentproblemcurrently much improved on doubled Wellbutrin the last several months. last manic episode was approximately 2 years ago. The onset qualityissudden. The problem occurs intermittently.Therelapse ofproblem has been gradually improvingsincelastonset.Associated symptoms include decreased concentration,insomnia, fatigue, morbid fixations, boredom, passive suicidal  ideation, anhedonia,headaches,and reactive sadness. Associated symptoms include no hopelessness,no morbid fixations, no boredom, no passive suicidal ideation, no anhedonia, and no actual self-harmthough having a prior suicide attemptThe symptoms are aggravated by work stress, medication and social issues.Past treatments include other medications.Compliance with treatment is good.Past compliance problems include difficulty with treatment plan, medication issues, medical issues and insurance issues.Previous treatment provided moderaterelief.Past medical history includes anxiety.  Anxiety Presents forfollow-upvisit. Symptoms includedecreased concentration,dizziness,excessive worry,malaise,muscle tensionand nervous/anxious behavior. Patient reports no depressed mood,palpitations,social avoidance, insomniaor suicidal ideas. Symptoms occurmost days. The severity of symptoms isinterfering with daily activities and causing significant distress. The quality of sleep isfair. Nighttime awakenings:occasional.  Individual Medical History/ Review of Systems: Changes? :Yes Weight is down 4 pounds in 6 months.  Patient uses Lipitor and Zestoretic in addition to Lamictal.  Allergies: Patient has no known allergies.  Current Medications:  Current Outpatient Medications:    atorvastatin (LIPITOR) 40 MG tablet, Take 1 tablet (40 mg total) by mouth daily., Disp: 90 tablet, Rfl: 1   buPROPion (WELLBUTRIN XL) 300 MG 24 hr tablet, TAKE 1 TABLET BY MOUTH EVERY DAY AFTER BREAKFAST, Disp: 90 tablet, Rfl: 1   cariprazine (VRAYLAR) capsule, Take 1 capsule (1.5 mg total) by mouth daily., Disp: 90 capsule, Rfl: 1   diazepam (VALIUM) 5 MG tablet, Take 1 tablet (5 mg total) by mouth 2 (two) times daily., Disp: 180 tablet, Rfl: 1   lamoTRIgine (LAMICTAL) 100 MG tablet, Take 1 tablet (100 mg total) by mouth 2 (two) times daily., Disp: 180 tablet, Rfl: 3   lisinopril-hydrochlorothiazide  (ZESTORETIC) 20-25 MG tablet, TAKE 1 TABLET BY MOUTH EVERY DAY, Disp: 30 tablet, Rfl: 3   meclizine (MEDI-MECLIZINE) 25 MG tablet, Take 1 tablet (25 mg total) by mouth 3 (three) times daily as needed for dizziness., Disp: 90 tablet, Rfl:  6   VASCEPA 1 g capsule, TAKE 2 CAPSULES BY MOUTH TWICE A DAY, Disp: 120 capsule, Rfl: 1   zaleplon (SONATA) 5 MG capsule, Take 1 capsule (5 mg total) by mouth at bedtime as needed. for sleep, Disp: 30 capsule, Rfl: 1  Medication Side Effects: none  Family Medical/ Social History: Changes? No  MENTAL HEALTH EXAM:  Height 5\' 9"  (1.753 m), weight 227 lb (103 kg).Body mass index is 33.52 kg/m. AIMS = 0.  General Appearance: Casual, Fairly Groomed, Meticulous and Obese  Eye Contact:  Good  Speech:  Clear and Coherent, Normal Rate and Talkative  Volume:  Normal  Mood:  Anxious, Dysphoric and Euthymic  Affect:  Congruent, Inappropriate, Full Range and Anxious  Thought Process:  Coherent, Goal Directed, Irrelevant and Descriptions of Associations: Tangential  Orientation:  Full (Time, Place, and Person)  Thought Content: Rumination and Tangential   Suicidal Thoughts:  No  Homicidal Thoughts:  No  Memory:  Immediate;   Good and Fair Remote;   Good and Fair  Judgement:  Good  Insight:  Good  Psychomotor Activity:  Normal and Mannerisms  Concentration:  Concentration: Fair and Attention Span: Fair  Recall:  [employer] of Knowledge: Good  Language: Good  Assets:  Desire for Improvement Social Support Talents/Skills Vocational/Educational  ADL's:  Intact  Cognition: WNL  Prognosis:  Fair    DIAGNOSES:    ICD-10-CM   1. Bipolar I disorder, moderate, current or most recent episode depressed, with anxious distress (HCC)  F31.32 cariprazine (VRAYLAR) capsule    buPROPion (WELLBUTRIN XL) 300 MG 24 hr tablet  2. GAD (generalized anxiety disorder)  F41.1 buPROPion (WELLBUTRIN XL) 300 MG 24 hr tablet    diazepam (VALIUM) 5 MG tablet  3. Obstructive  sleep apnea  G47.33 diazepam (VALIUM) 5 MG tablet    Receiving Psychotherapy: No    RECOMMENDATIONS: Support and education are updated patient attributing much of his medical psychiatric stabilization to improved mood and anxiety from current medications he wishes to not change.  He is E scribed Wellbutrin 300 mg XL every morning sent as #90 with 1 refill to [facility] for bipolar depressed, generalized anxiety, and medical psychiatric consequences of his neurological disorders.  He is E scribed Valium 5 mg twice daily #180 with 1 refill sent to [facility] for generalized anxiety and medical psychiatric consequences of his neurological disorders with [place] registry last dispensing 09/30/2019 with no contraindication in the last year.  He is E scribed Vraylar 1.5 mg every morning sent as #90 with 1 refill to [facility] for bipolar disorder.  Case closure for my retirement is completed, and he has follow-up with Thayer Headings, PMHNP in 6 months   Delight Hoh, MD

## 2020-01-18 ENCOUNTER — Other Ambulatory Visit: Payer: Self-pay | Admitting: Physician Assistant

## 2020-01-18 DIAGNOSIS — I10 Essential (primary) hypertension: Secondary | ICD-10-CM

## 2020-01-26 ENCOUNTER — Encounter: Payer: Self-pay | Admitting: Physician Assistant

## 2020-02-21 ENCOUNTER — Other Ambulatory Visit: Payer: Self-pay | Admitting: Physician Assistant

## 2020-04-14 ENCOUNTER — Ambulatory Visit (INDEPENDENT_AMBULATORY_CARE_PROVIDER_SITE_OTHER): Payer: BC Managed Care – PPO | Admitting: Family Medicine

## 2020-04-14 ENCOUNTER — Other Ambulatory Visit: Payer: Self-pay

## 2020-04-14 ENCOUNTER — Encounter: Payer: Self-pay | Admitting: Family Medicine

## 2020-04-14 VITALS — BP 112/76 | HR 72 | Temp 98.1°F | Ht 69.0 in | Wt 225.8 lb

## 2020-04-14 DIAGNOSIS — Z23 Encounter for immunization: Secondary | ICD-10-CM

## 2020-04-14 DIAGNOSIS — R569 Unspecified convulsions: Secondary | ICD-10-CM

## 2020-04-14 DIAGNOSIS — E785 Hyperlipidemia, unspecified: Secondary | ICD-10-CM | POA: Diagnosis not present

## 2020-04-14 DIAGNOSIS — G4700 Insomnia, unspecified: Secondary | ICD-10-CM

## 2020-04-14 DIAGNOSIS — Z125 Encounter for screening for malignant neoplasm of prostate: Secondary | ICD-10-CM | POA: Diagnosis not present

## 2020-04-14 DIAGNOSIS — D1802 Hemangioma of intracranial structures: Secondary | ICD-10-CM | POA: Diagnosis not present

## 2020-04-14 DIAGNOSIS — Z6833 Body mass index (BMI) 33.0-33.9, adult: Secondary | ICD-10-CM

## 2020-04-14 DIAGNOSIS — I613 Nontraumatic intracerebral hemorrhage in brain stem: Secondary | ICD-10-CM

## 2020-04-14 DIAGNOSIS — E669 Obesity, unspecified: Secondary | ICD-10-CM

## 2020-04-14 DIAGNOSIS — R739 Hyperglycemia, unspecified: Secondary | ICD-10-CM

## 2020-04-14 DIAGNOSIS — E1169 Type 2 diabetes mellitus with other specified complication: Secondary | ICD-10-CM | POA: Insufficient documentation

## 2020-04-14 DIAGNOSIS — G4733 Obstructive sleep apnea (adult) (pediatric): Secondary | ICD-10-CM

## 2020-04-14 DIAGNOSIS — Z9989 Dependence on other enabling machines and devices: Secondary | ICD-10-CM

## 2020-04-14 DIAGNOSIS — F411 Generalized anxiety disorder: Secondary | ICD-10-CM

## 2020-04-14 DIAGNOSIS — I10 Essential (primary) hypertension: Secondary | ICD-10-CM | POA: Diagnosis not present

## 2020-04-14 DIAGNOSIS — Z0001 Encounter for general adult medical examination with abnormal findings: Secondary | ICD-10-CM | POA: Diagnosis not present

## 2020-04-14 DIAGNOSIS — F3132 Bipolar disorder, current episode depressed, moderate: Secondary | ICD-10-CM

## 2020-04-14 DIAGNOSIS — E559 Vitamin D deficiency, unspecified: Secondary | ICD-10-CM | POA: Diagnosis not present

## 2020-04-14 LAB — CBC
HCT: 45.7 % (ref 39.0–52.0)
Hemoglobin: 15.5 g/dL (ref 13.0–17.0)
MCHC: 33.8 g/dL (ref 30.0–36.0)
MCV: 88.7 fl (ref 78.0–100.0)
Platelets: 195 10*3/uL (ref 150.0–400.0)
RBC: 5.16 Mil/uL (ref 4.22–5.81)
RDW: 13.8 % (ref 11.5–15.5)
WBC: 9.3 10*3/uL (ref 4.0–10.5)

## 2020-04-14 LAB — COMPREHENSIVE METABOLIC PANEL
ALT: 41 U/L (ref 0–53)
AST: 18 U/L (ref 0–37)
Albumin: 4.5 g/dL (ref 3.5–5.2)
Alkaline Phosphatase: 68 U/L (ref 39–117)
BUN: 20 mg/dL (ref 6–23)
CO2: 28 mEq/L (ref 19–32)
Calcium: 9.6 mg/dL (ref 8.4–10.5)
Chloride: 100 mEq/L (ref 96–112)
Creatinine, Ser: 0.94 mg/dL (ref 0.40–1.50)
GFR: 87.74 mL/min (ref >60.00)
Glucose, Bld: 104 mg/dL — ABNORMAL HIGH (ref 70–99)
Potassium: 4.3 mEq/L (ref 3.5–5.1)
Sodium: 138 mEq/L (ref 135–145)
Total Bilirubin: 0.5 mg/dL (ref 0.2–1.2)
Total Protein: 6.8 g/dL (ref 6.0–8.3)

## 2020-04-14 LAB — LIPID PANEL
Cholesterol: 118 mg/dL (ref 0–200)
HDL: 38.7 mg/dL — ABNORMAL LOW
LDL Cholesterol: 44 mg/dL (ref 0–99)
NonHDL: 78.96
Total CHOL/HDL Ratio: 3
Triglycerides: 173 mg/dL — ABNORMAL HIGH (ref 0.0–149.0)
VLDL: 34.6 mg/dL (ref 0.0–40.0)

## 2020-04-14 LAB — PSA: PSA: 1.72 ng/mL (ref 0.10–4.00)

## 2020-04-14 LAB — TSH: TSH: 1.26 u[IU]/mL (ref 0.35–4.50)

## 2020-04-14 LAB — VITAMIN D 25 HYDROXY (VIT D DEFICIENCY, FRACTURES): VITD: 35.63 ng/mL (ref 30.00–100.00)

## 2020-04-14 LAB — HEMOGLOBIN A1C: Hgb A1c MFr Bld: 6.3 % (ref 4.6–6.5)

## 2020-04-14 NOTE — Assessment & Plan Note (Signed)
Managed by sleep medicine.

## 2020-04-14 NOTE — Assessment & Plan Note (Signed)
Check labs today.  Continue Lipitor 40 mg daily and Vascepa 1 g twice daily.

## 2020-04-14 NOTE — Progress Notes (Signed)
Chief Complaint:  Todd Harris is a 60 y.o. male who presents today for his annual comprehensive physical exam.    Assessment/Plan:  Chronic Problems Addressed Today: HTN (hypertension) At goal.  Continue lisinopril-HCTZ 20-25.  Check labs.  Cavernous hemangioma of brain [facility]) Following with neurology.  Blood pressure at goal.  Dyslipidemia Check labs today.  Continue Lipitor 40 mg daily and Vascepa 1 g twice daily.  Vitamin D insufficiency Check vitamin D level today.  Bipolar I disorder, moderate, current or most recent episode depressed, with anxious distress ([place]) Managed by psychiatry.  He is on Vraylar, valium, and Lamictal.  Is also on Wellbutrin.  Discussed potential for this to lower seizure threshold however he has not had seizure in several years.  We will continue management per psychiatry.  GAD (generalized anxiety disorder) Continue management per psychiatry.  OSA on CPAP Managed by sleep medicine.  Insomnia On Sonata for for sleep medicine.  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  Seizures ([place]) Continue management per neurology.  No recurrent seizures for several years.  Preventative Healthcare: Check labs. Due for colonoscopy next year.   Patient Counseling(The following topics were reviewed and/or handout was given):  -Nutrition: Stressed importance of moderation in sodium/caffeine intake, saturated fat and cholesterol, caloric balance, sufficient intake of fresh fruits, vegetables, and fiber.  -Stressed the importance of regular exercise.   -Substance Abuse: Discussed cessation/primary prevention of tobacco, alcohol, or other drug use; driving or other dangerous activities under the influence; availability of treatment for abuse.   -Injury prevention: Discussed safety belts, safety helmets, smoke detector, smoking near bedding or upholstery.   -Sexuality: Discussed sexually transmitted diseases, partner selection, use of condoms,  avoidance of unintended pregnancy and contraceptive alternatives.   -Dental health: Discussed importance of regular tooth brushing, flossing, and dental visits.  -Health maintenance and immunizations reviewed. Please refer to Health maintenance section.  Return to care in 1 year for next preventative visit.     Subjective:  HPI:  He has no acute complaints today.   Lifestyle Diet: Balanced. Plenty of fruits and vegetables.  Exercise: Limited due to residual deficits from stroke.  Depression screen [facility] 2/9 02/01/2018  Decreased Interest 0  Down, Depressed, Hopeless 2  PHQ - 2 Score 2  Altered sleeping 1  Tired, decreased energy 0  Change in appetite 0  Feeling bad or failure about yourself  0  Trouble concentrating 0  Moving slowly or fidgety/restless 0  Suicidal thoughts 0  PHQ-9 Score 3  Difficult doing work/chores Somewhat difficult   Health Maintenance Due  Topic Date Due  . Hepatitis C Screening  Never done    ROS: Per HPI, otherwise a complete review of systems was negative.   PMH:  The following were reviewed and entered/updated in epic: Past Medical History:  Diagnosis Date  . Anxiety   . Atrial arrhythmia   . Barrett esophagus   . Bipolar 1 disorder ([place])   . Cavernous angioma   . Depression   . History of chicken pox   . Hypertension   . Seizures ([place])    1 in 2005, 1 in 2016   . Sleep apnea   . Sleep apnea   . Stroke ([place]) 11/2014   No residual deficits  . Venous malformation    Brain   Patient Active Problem List   Diagnosis Date Noted  . Dyslipidemia 04/14/2020  . Vitamin D insufficiency 05/13/2019  . Vertigo of central origin 02/01/2018  . GAD (  generalized anxiety disorder) 12/06/2017  . Bipolar I disorder, moderate, current or most recent episode depressed, with anxious distress ([place]) 12/06/2017  . 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[place]) 05/17/2017  . Insomnia 05/17/2017  . OSA on CPAP 05/17/2017  . Seizures ([place]) 09/28/2016  . Cavernous hemangioma  of brain ([place]) 12/30/2014  . HTN (hypertension) 12/30/2014   Past Surgical History:  Procedure Laterality Date  . APPENDECTOMY  1970   age 56  . bone spur     age 30  . kidney stones    . RAD ONC MR BRAIN GAMMA KNIFE  12/05/2016  . TONSILLECTOMY  1968  . UMBILICAL HERNIA REPAIR N/A 07/24/2018   Procedure: HERNIA REPAIR UMBILICAL ADULT;  Surgeon: Stark Klein, MD;  Location: [facility];  Service: General;  Laterality: N/A;    Family History  Problem Relation Age of Onset  . Aneurysm Maternal Grandmother   . Asthma Mother   . Diabetes Mother   . Hypertension Mother   . Diabetes Brother   . Diabetes Brother   . Pancreatic cancer Father   . Diabetes Father   . Cancer Father        Pancreatic  . Diabetes Paternal Grandmother   . Diabetes Paternal Grandfather   . Colon cancer Neg Hx     Medications- reviewed and updated Current Outpatient Medications  Medication Sig Dispense Refill  . atorvastatin (LIPITOR) 40 MG tablet Take 1 tablet (40 mg total) by mouth daily. 90 tablet 1  . buPROPion (WELLBUTRIN XL) 300 MG 24 hr tablet TAKE 1 TABLET BY MOUTH EVERY DAY AFTER BREAKFAST 90 tablet 1  . cariprazine (VRAYLAR) capsule Take 1 capsule (1.5 mg total) by mouth daily. 90 capsule 1  . diazepam (VALIUM) 5 MG tablet Take 1 tablet (5 mg total) by mouth 2 (two) times daily. 180 tablet 1  . lamoTRIgine (LAMICTAL) 100 MG tablet Take 1 tablet (100 mg total) by mouth 2 (two) times daily. 180 tablet 3  . lisinopril-hydrochlorothiazide (ZESTORETIC) 20-25 MG tablet TAKE 1 TABLET BY MOUTH EVERY DAY 90 tablet 2  . meclizine (MEDI-MECLIZINE) 25 MG tablet Take 1 tablet (25 mg total) by mouth 3 (three) times daily as needed for dizziness. 90 tablet 6  . VASCEPA 1 g capsule TAKE 2 CAPSULES BY MOUTH TWICE A DAY 120 capsule 1  . zaleplon (SONATA) 5 MG capsule Take 1 capsule (5 mg total) by mouth at bedtime as needed. for sleep 30 capsule 1   No current facility-administered medications for this visit.     Allergies-reviewed and updated No Known Allergies  Social History   Socioeconomic History  . Marital status: Married    Spouse name: [name]   . Number of children: 3  . Years of education: 16+  . Highest education level: Not on file  Occupational History  . 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 Smoking status: Never Smoker  . Smokeless tobacco: Never Used  Vaping Use  . Vaping Use: Never used  Substance and Sexual Activity  . Alcohol use: Yes    Alcohol/week: 4.0 standard drinks    Types: 4 Standard drinks or equivalent per week    Comment: was 4-5 beers/week  . Drug use: No  . Sexual activity: Yes  Other Topics Concern  . Not on file  Social History Narrative   Lives in [place].    Married. 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      Caffeine use: 2  cup coffee per day   Social Determinants of Health   Financial Resource Strain: Not on file  Food Insecurity: Not on file  Transportation Needs: Not on file  Physical Activity: Not on file  Stress: Not on file  Social Connections: Not on file        Objective:  Physical Exam: BP 112/76   Pulse 72   Temp 98.1 F (36.7 C) (Temporal)   Ht 5\' 9"  (1.753 m)   Wt 225 lb 12.8 oz (102.4 kg)   SpO2 96%   BMI 33.34 kg/m   Body mass index is 33.34 kg/m. Wt Readings from Last 3 Encounters:  04/14/20 225 lb 12.8 oz (102.4 kg)  05/02/19 228 lb (103.4 kg)  07/29/18 221 lb (100.2 kg)   Gen: NAD, resting comfortably HEENT: TMs normal bilaterally. OP clear. No thyromegaly noted.  CV: RRR with no murmurs appreciated Pulm: NWOB, CTAB with no crackles, wheezes, or rhonchi GI: Normal bowel sounds present. Soft, Nontender, Nondistended. MSK: no edema, cyanosis, or clubbing noted Skin: warm, dry Neuro: CN2-12 grossly intact. Strength 5/5 in upper and lower extremities. Reflexes symmetric and intact bilaterally.  Psych: Normal affect and thought content     Daruis Swaim M.  Jerline Pain, MD 04/14/2020 10:32 AM

## 2020-04-14 NOTE — Assessment & Plan Note (Signed)
Continue management per neurology. 

## 2020-04-14 NOTE — Assessment & Plan Note (Addendum)
Managed by psychiatry.  He is on Vraylar, valium, and Lamictal.  Is also on Wellbutrin.  Discussed potential for this to lower seizure threshold however he has not had seizure in several years.  We will continue management per psychiatry.

## 2020-04-14 NOTE — Assessment & Plan Note (Signed)
Continue management per neurology.  No recurrent seizures for several years.

## 2020-04-14 NOTE — Assessment & Plan Note (Signed)
At goal.  Continue lisinopril-HCTZ 20-25.  Check labs.

## 2020-04-14 NOTE — Patient Instructions (Signed)
It was very nice to see you today!  We will check blood work today.  Please continue to work on diet and activity as tolerated.  I will see back in a year.  Come back to see me sooner if needed.  Take care, Dr Jerline Pain  PLEASE NOTE:  If you had any lab tests please let us know if you have not heard back within a few days. You may see your results on mychart before we have a chance to review them but we will give you a call once they are reviewed by [place]. If we ordered any referrals today, please let us know if you have not heard from their office within the next week.   Please try these tips to maintain a healthy lifestyle:   Eat at least 3 REAL meals and 1-2 snacks per day.  Aim for no more than 5 hours between eating.  If you eat breakfast, please do so within one hour of getting up.    Each meal should contain half fruits/vegetables, one quarter protein, and one quarter carbs (no bigger than a computer mouse)   Cut down on sweet beverages. This includes juice, soda, and sweet tea.     Drink at least 1 glass of water with each meal and aim for at least 8 glasses per day   Exercise at least 150 minutes every week.    Preventive Care 90-34 Years Old, Male Preventive care refers to lifestyle choices and visits with your health care provider that can promote health and wellness. This includes:  A yearly physical exam. This is also called an annual wellness visit.  Regular dental and eye exams.  Immunizations.  Screening for certain conditions.  Healthy lifestyle choices, such as: ? Eating a healthy diet. ? Getting regular exercise. ? Not using drugs or products that contain nicotine and tobacco. ? Limiting alcohol use. What can I expect for my preventive care visit? Physical exam Your health care provider will check your:  Height and weight. These may be used to calculate your BMI (body mass index). BMI is a measurement that tells if you are at a healthy  weight.  Heart rate and blood pressure.  Body temperature.  Skin for abnormal spots. Counseling Your health care provider may ask you questions about your:  Past medical problems.  Family's medical history.  Alcohol, tobacco, and drug use.  Emotional well-being.  Home life and relationship well-being.  Sexual activity.  Diet, exercise, and sleep habits.  Work and work Statistician.  Access to firearms. What immunizations do I need? Vaccines are usually given at various ages, according to a schedule. Your health care provider will recommend vaccines for you based on your age, medical history, and lifestyle or other factors, such as travel or where you work.   What tests do I need? Blood tests  Lipid and cholesterol levels. These may be checked every 5 years, or more often if you are over 52 years old.  Hepatitis C test.  Hepatitis B test. Screening  Lung cancer screening. You may have this screening every year starting at age 69 if you have a 30-pack-year history of smoking and currently smoke or have quit within the past 15 years.  Prostate cancer screening. Recommendations will vary depending on your family history and other risks.  Genital exam to check for testicular cancer or hernias.  Colorectal cancer screening. ? All adults should have this screening starting at age 62 and continuing until age 30. ?  Your health care provider may recommend screening at age 29 if you are at increased risk. ? You will have tests every 1-10 years, depending on your results and the type of screening test.  Diabetes screening. ? This is done by checking your blood sugar (glucose) after you have not eaten for a while (fasting). ? You may have this done every 1-3 years.  STD (sexually transmitted disease) testing, if you are at risk. Follow these instructions at home: Eating and drinking  Eat a diet that includes fresh fruits and vegetables, whole grains, lean protein, and  low-fat dairy products.  Take vitamin and mineral supplements as recommended by your health care provider.  Do not drink alcohol if your health care provider tells you not to drink.  If you drink alcohol: ? Limit how much you have to 0-2 drinks a day. ? Be aware of how much alcohol is in your drink. In the [place] one drink equals one 12 oz bottle of beer (355 mL), one 5 oz glass of wine (148 mL), or one 1 oz glass of hard liquor (44 mL).   Lifestyle  Take daily care of your teeth and gums. Brush your teeth every morning and night with fluoride toothpaste. Floss one time each day.  Stay active. Exercise for at least 30 minutes 5 or more days each week.  Do not use any products that contain nicotine or tobacco, such as cigarettes, e-cigarettes, and chewing tobacco. If you need help quitting, ask your health care provider.  Do not use drugs.  If you are sexually active, practice safe sex. Use a condom or other form of protection to prevent STIs (sexually transmitted infections).  If told by your health care provider, take low-dose aspirin daily starting at age 47.  Find healthy ways to cope with stress, such as: ? Meditation, yoga, or listening to music. ? Journaling. ? Talking to a trusted person. ? Spending time with friends and family. Safety  Always wear your seat belt while driving or riding in a vehicle.  Do not drive: ? If you have been drinking alcohol. Do not ride with someone who has been drinking. ? When you are tired or distracted. ? While texting.  Wear a helmet and other protective equipment during sports activities.  If you have firearms in your house, make sure you follow all gun safety procedures. What's next?  Go to your health care provider once a year for an annual wellness visit.  Ask your health care provider how often you should have your eyes and teeth checked.  Stay up to date on all vaccines. This information is not intended to replace advice  given to you by your health care provider. Make sure you discuss any questions you have with your health care provider. Document Revised: 09/17/2018 Document Reviewed: 12/13/2017 Elsevier Patient Education  2021 [employer].

## 2020-04-14 NOTE — Assessment & Plan Note (Signed)
On [facility] for for sleep medicine.

## 2020-04-14 NOTE — Assessment & Plan Note (Signed)
Continue management per psychiatry. 

## 2020-04-14 NOTE — Assessment & Plan Note (Signed)
Check vitamin D level today 

## 2020-04-14 NOTE — Assessment & Plan Note (Signed)
Following with neurology.  Blood pressure at goal.

## 2020-04-15 NOTE — Progress Notes (Signed)
Please inform patient of the following:  A1c is elevated compared to last time.  Recommend starting metformin 500 mg daily to reduce numbers and low risk of heart attack and stroke.  Please send this in if patient is willing to start.  Cholesterol borderline elevated.  Everything else is normal.  We can recheck in a year.  Would like for him to come back in 6 months to recheck A1c if he starts metformin.

## 2020-04-17 ENCOUNTER — Encounter: Payer: Self-pay | Admitting: Family Medicine

## 2020-04-19 MED ORDER — METFORMIN HCL 500 MG PO TABS: 500.0000 mg | ORAL_TABLET | Freq: Every day | ORAL | 3 refills | Status: DC

## 2020-04-22 ENCOUNTER — Ambulatory Visit: Payer: Managed Care, Other (non HMO) | Admitting: Neurology

## 2020-04-24 ENCOUNTER — Other Ambulatory Visit: Payer: Self-pay | Admitting: Family

## 2020-05-03 ENCOUNTER — Other Ambulatory Visit: Payer: Self-pay

## 2020-05-08 ENCOUNTER — Encounter: Payer: Self-pay | Admitting: Family Medicine

## 2020-05-10 ENCOUNTER — Other Ambulatory Visit: Payer: Self-pay | Admitting: Family Medicine

## 2020-05-10 ENCOUNTER — Other Ambulatory Visit: Payer: Self-pay | Admitting: *Deleted

## 2020-05-10 MED ORDER — ICOSAPENT ETHYL 1 G PO CAPS: 2.0000 g | ORAL_CAPSULE | Freq: Two times a day (BID) | ORAL | 1 refills | Status: DC

## 2020-06-10 ENCOUNTER — Other Ambulatory Visit: Payer: Self-pay | Admitting: Psychiatry

## 2020-06-10 DIAGNOSIS — F3132 Bipolar disorder, current episode depressed, moderate: Secondary | ICD-10-CM

## 2020-06-12 ENCOUNTER — Other Ambulatory Visit: Payer: Self-pay | Admitting: Psychiatry

## 2020-06-12 DIAGNOSIS — F3132 Bipolar disorder, current episode depressed, moderate: Secondary | ICD-10-CM

## 2020-06-14 ENCOUNTER — Other Ambulatory Visit: Payer: Self-pay

## 2020-06-14 ENCOUNTER — Encounter: Payer: Self-pay | Admitting: Family Medicine

## 2020-06-14 NOTE — Telephone Encounter (Signed)
Spoke with patient, patient stated pharmacy will fill Rx  No other concern at this time

## 2020-06-14 NOTE — Telephone Encounter (Signed)
Patient is calling in stating that he doesn't understand why it isnt covered when he has been taking this for a few years and Donyae has not changed insurances since last refill.

## 2020-06-14 NOTE — Telephone Encounter (Signed)
Medication is not covered by his insurance. I lvm for the pt to call the office back. Pt will need to see if there is a similar medication that insurance can cover.

## 2020-07-09 ENCOUNTER — Encounter: Payer: Self-pay | Admitting: Behavioral Health

## 2020-07-09 ENCOUNTER — Ambulatory Visit: Payer: BC Managed Care – PPO | Admitting: Psychiatry

## 2020-07-09 ENCOUNTER — Ambulatory Visit (INDEPENDENT_AMBULATORY_CARE_PROVIDER_SITE_OTHER): Payer: BC Managed Care – PPO | Admitting: Behavioral Health

## 2020-07-09 ENCOUNTER — Other Ambulatory Visit: Payer: Self-pay

## 2020-07-09 VITALS — BP 120/81 | HR 79 | Ht 69.0 in | Wt 228.0 lb

## 2020-07-09 DIAGNOSIS — G4733 Obstructive sleep apnea (adult) (pediatric): Secondary | ICD-10-CM

## 2020-07-09 DIAGNOSIS — F319 Bipolar disorder, unspecified: Secondary | ICD-10-CM

## 2020-07-09 DIAGNOSIS — F411 Generalized anxiety disorder: Secondary | ICD-10-CM | POA: Diagnosis not present

## 2020-07-09 MED ORDER — DIAZEPAM 5 MG PO TABS: 5.0000 mg | ORAL_TABLET | Freq: Two times a day (BID) | ORAL | 0 refills | Status: DC

## 2020-07-09 MED ORDER — CARIPRAZINE HCL 3 MG PO CAPS: 3.0000 mg | ORAL_CAPSULE | Freq: Every day | ORAL | 1 refills | Status: DC

## 2020-07-09 NOTE — Progress Notes (Signed)
[facility]  Patient ID: Todd Harris,  MRN: [id number]  PCP: Vivi Barrack, MD  Date of Evaluation: 07/09/2020 Time spent:40 minutes  Chief Complaint:  Chief Complaint   Anxiety; Depression; Follow-up; Medication Problem; Medication Refill     HISTORY/CURRENT STATUS: HPI 60 year old male presents to this office for follow up and medication management. He is former patient of Dr. Milana Huntsman. He says that he has previous diagnosis of bipolar I disorder and generalized anxiety. Says that he has struggled more since his fathers passing in 2011 and his own health issues. Says he had a pontine stroke approximately 3 year ago. Says that he also has been diagnosed with cavernous angioma brain. Say that he has been stable on current medication regimen but lately has had more depressive symptoms and anxiety. Says that he has not had medication adjustment in 2 years. He reports anxiety today 3/10 and depression 5/10. Says he sleeps 7 hours per night. Says he feels like he may need medication adjustment. Reports no mania. No psychosis. No SI/HI.  Could not provide list of past medication trials this visit.   Individual Medical History/ Review of Systems: Changes? :No   Allergies: Patient has no known allergies.  Current Medications:  Current Outpatient Medications:    atorvastatin (LIPITOR) 40 MG tablet, Take 1 tablet (40 mg total) by mouth daily., Disp: 90 tablet, Rfl: 1   buPROPion (WELLBUTRIN XL) 300 MG 24 hr tablet, TAKE 1 TABLET BY MOUTH EVERY DAY AFTER BREAKFAST, Disp: 90 tablet, Rfl: 1   cariprazine (VRAYLAR) 3 MG capsule, Take 1 capsule (3 mg total) by mouth daily., Disp: 90 capsule, Rfl: 1   icosapent Ethyl (VASCEPA) 1 g capsule, TAKE 2 CAPSULES BY MOUTH 2 TIMES DAILY., Disp: 120 capsule, Rfl: 1   lamoTRIgine (LAMICTAL) 100 MG tablet, Take 1 tablet (100 mg total) by mouth 2 (two) times daily., Disp: 180 tablet, Rfl: 3   lisinopril-hydrochlorothiazide (ZESTORETIC)  20-25 MG tablet, TAKE 1 TABLET BY MOUTH EVERY DAY, Disp: 90 tablet, Rfl: 2   meclizine (MEDI-MECLIZINE) 25 MG tablet, Take 1 tablet (25 mg total) by mouth 3 (three) times daily as needed for dizziness., Disp: 90 tablet, Rfl: 6   metFORMIN (GLUCOPHAGE) 500 MG tablet, Take 1 tablet (500 mg total) by mouth daily with breakfast., Disp: 90 tablet, Rfl: 3   VRAYLAR 1.5 MG capsule, TAKE 1 CAPSULE BY MOUTH DAILY., Disp: 90 capsule, Rfl: 0   zaleplon (SONATA) 5 MG capsule, Take 1 capsule (5 mg total) by mouth at bedtime as needed. for sleep, Disp: 30 capsule, Rfl: 1   diazepam (VALIUM) 5 MG tablet, Take 1 tablet (5 mg total) by mouth 2 (two) times daily., Disp: 180 tablet, Rfl: 0 Medication Side Effects: none  Family Medical/ Social History: Changes? No  MENTAL HEALTH EXAM:  Blood pressure 120/81, pulse 79, height 5\' 9"  (1.753 m), weight 228 lb (103.4 kg).Body mass index is 33.67 kg/m.  General Appearance: Casual, Neat, and Well Groomed  Eye Contact:  Good  Speech:  Clear and Coherent  Volume:  Normal  Mood:  Anxious and Depressed  Affect:  Appropriate  Thought Process:  Coherent  Orientation:  Full (Time, Place, and Person)  Thought Content: Logical   Suicidal Thoughts:  No  Homicidal Thoughts:  No  Memory:  WNL  Judgement:  Good  Insight:  Good  Psychomotor Activity:  Normal  Concentration:  Concentration: Good  Recall:  Good  Fund of Knowledge: Good  Language: Good  Assets:  Desire for Improvement  ADL's:  Intact  Cognition: WNL  Prognosis:  Good    DIAGNOSES:    ICD-10-CM   1. Bipolar I disorder (HCC)  F31.9 cariprazine (VRAYLAR) 3 MG capsule    2. GAD (generalized anxiety disorder)  F41.1 cariprazine (VRAYLAR) 3 MG capsule    diazepam (VALIUM) 5 MG tablet    3. Obstructive sleep apnea  G47.33 diazepam (VALIUM) 5 MG tablet      Receiving Psychotherapy: No    RECOMMENDATIONS:  Increase Vraylar to 3 mg daily Continue Wellbutrin 300 mg XL daily Continue Valium  5 mg  tablet twice daily Continue Lamictal 100 mg twice daily To report any worsening symptoms  or side effects promptly Monitor any increase in dizziness and notify if worsens Greater than 50% of face to face time with patient was spent on counseling and coordination of care. We discussed long history of anxiety and depression along with previous diagnosis of bipolar I disorder. Discussed history of suicide attempt in 2011. Discussed potential benefits, risk, and side effects of benzodiazepines to include potential risk of tolerance and dependence, as well as possible drowsiness.  Advised patient not to drive if experiencing drowsiness and to take lowest possible effective dose to minimize risk of dependence and tolerance.  Escribed Vraylar 3 mg to pharmacy.  PDMP reviewed.    Elwanda [place], NP

## 2020-07-11 ENCOUNTER — Other Ambulatory Visit: Payer: Self-pay | Admitting: Family Medicine

## 2020-07-11 ENCOUNTER — Encounter: Payer: Self-pay | Admitting: Behavioral Health

## 2020-07-11 MED ORDER — CARIPRAZINE HCL 3 MG PO CAPS: 3.0000 mg | ORAL_CAPSULE | Freq: Every day | ORAL | 3 refills | Status: DC

## 2020-07-12 ENCOUNTER — Other Ambulatory Visit: Payer: Self-pay | Admitting: Family Medicine

## 2020-07-12 NOTE — Telephone Encounter (Signed)
See below

## 2020-07-12 NOTE — Telephone Encounter (Signed)
Ok to send in 90 day supply 

## 2020-07-12 NOTE — Telephone Encounter (Signed)
I sent this to you earlier but you refilled it before making changes. Insurance will only cover 90 day supply, ok to change instructions?

## 2020-07-12 NOTE — Telephone Encounter (Signed)
Not sure, was reading the notes in yellow from the pharmacy.

## 2020-07-12 NOTE — Telephone Encounter (Signed)
Hello, are you saying they will only pay for 90 capsules total so one capsule daily or 90 day supply for total 360 capsules for the directions that are ordered?

## 2020-07-21 ENCOUNTER — Other Ambulatory Visit: Payer: Self-pay | Admitting: Family Medicine

## 2020-07-21 DIAGNOSIS — E785 Hyperlipidemia, unspecified: Secondary | ICD-10-CM

## 2020-07-26 ENCOUNTER — Encounter: Payer: Self-pay | Admitting: Family Medicine

## 2020-08-03 ENCOUNTER — Other Ambulatory Visit: Payer: Self-pay | Admitting: Adult Health

## 2020-08-03 ENCOUNTER — Encounter: Payer: Self-pay | Admitting: *Deleted

## 2020-08-03 DIAGNOSIS — G40209 Localization-related (focal) (partial) symptomatic epilepsy and epileptic syndromes with complex partial seizures, not intractable, without status epilepticus: Secondary | ICD-10-CM

## 2020-08-11 ENCOUNTER — Other Ambulatory Visit: Payer: Self-pay | Admitting: *Deleted

## 2020-08-11 DIAGNOSIS — H814 Vertigo of central origin: Secondary | ICD-10-CM

## 2020-08-11 MED ORDER — MECLIZINE HCL 25 MG PO TABS: 25.0000 mg | ORAL_TABLET | Freq: Three times a day (TID) | ORAL | 6 refills | Status: DC | PRN

## 2020-09-07 ENCOUNTER — Other Ambulatory Visit: Payer: Self-pay | Admitting: Family Medicine

## 2020-09-07 ENCOUNTER — Encounter: Payer: Self-pay | Admitting: Adult Health

## 2020-09-07 ENCOUNTER — Ambulatory Visit (INDEPENDENT_AMBULATORY_CARE_PROVIDER_SITE_OTHER): Payer: BC Managed Care – PPO | Admitting: Adult Health

## 2020-09-07 VITALS — BP 128/81 | HR 68 | Ht 69.0 in | Wt 227.4 lb

## 2020-09-07 DIAGNOSIS — Z5181 Encounter for therapeutic drug level monitoring: Secondary | ICD-10-CM | POA: Diagnosis not present

## 2020-09-07 DIAGNOSIS — D1802 Hemangioma of intracranial structures: Secondary | ICD-10-CM

## 2020-09-07 DIAGNOSIS — G40209 Localization-related (focal) (partial) symptomatic epilepsy and epileptic syndromes with complex partial seizures, not intractable, without status epilepticus: Secondary | ICD-10-CM | POA: Diagnosis not present

## 2020-09-07 DIAGNOSIS — F411 Generalized anxiety disorder: Secondary | ICD-10-CM

## 2020-09-07 DIAGNOSIS — H814 Vertigo of central origin: Secondary | ICD-10-CM

## 2020-09-07 DIAGNOSIS — F3132 Bipolar disorder, current episode depressed, moderate: Secondary | ICD-10-CM

## 2020-09-07 DIAGNOSIS — G4733 Obstructive sleep apnea (adult) (pediatric): Secondary | ICD-10-CM

## 2020-09-07 DIAGNOSIS — I613 Nontraumatic intracerebral hemorrhage in brain stem: Secondary | ICD-10-CM | POA: Diagnosis not present

## 2020-09-07 DIAGNOSIS — Z9989 Dependence on other enabling machines and devices: Secondary | ICD-10-CM

## 2020-09-07 MED ORDER — LAMOTRIGINE 100 MG PO TABS: 100.0000 mg | ORAL_TABLET | Freq: Two times a day (BID) | ORAL | 3 refills | Status: DC
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
## 2020-09-07 NOTE — Patient Instructions (Addendum)
Restart vestibular therapy for continued vertigo/dizziness   We will repeat imaging of your brain to ensure no new areas of concern contributing to your vertigo  Continue lamotrigine '100mg'$  twice daily for seizure prevention   Continue CPAP for sleep apnea management     Followup in the future with me in 6 months or call earlier if needed      Thank you for coming to see [place] at [facility]. I hope we have been able to provide you high quality care today.  You may receive a patient satisfaction survey over the next few weeks. We would appreciate your feedback and comments so that we may continue to improve ourselves and the health of our patients.

## 2020-09-08 LAB — LAMOTRIGINE LEVEL: Lamotrigine Lvl: 2.9 ug/mL (ref 2.0–20.0)

## 2020-09-09 ENCOUNTER — Ambulatory Visit: Payer: BC Managed Care – PPO | Attending: Adult Health | Admitting: Physical Therapy

## 2020-09-09 ENCOUNTER — Other Ambulatory Visit: Payer: Self-pay | Admitting: Psychiatry

## 2020-09-09 ENCOUNTER — Other Ambulatory Visit: Payer: Self-pay

## 2020-09-09 VITALS — BP 122/80 | HR 65

## 2020-09-09 DIAGNOSIS — R2681 Unsteadiness on feet: Secondary | ICD-10-CM | POA: Diagnosis not present

## 2020-09-09 DIAGNOSIS — R278 Other lack of coordination: Secondary | ICD-10-CM | POA: Diagnosis not present

## 2020-09-09 DIAGNOSIS — R208 Other disturbances of skin sensation: Secondary | ICD-10-CM | POA: Insufficient documentation

## 2020-09-09 DIAGNOSIS — M6281 Muscle weakness (generalized): Secondary | ICD-10-CM | POA: Insufficient documentation

## 2020-09-09 DIAGNOSIS — H8111 Benign paroxysmal vertigo, right ear: Secondary | ICD-10-CM | POA: Diagnosis not present

## 2020-09-09 DIAGNOSIS — R2689 Other abnormalities of gait and mobility: Secondary | ICD-10-CM | POA: Diagnosis not present

## 2020-09-09 DIAGNOSIS — F3132 Bipolar disorder, current episode depressed, moderate: Secondary | ICD-10-CM

## 2020-09-09 DIAGNOSIS — R42 Dizziness and giddiness: Secondary | ICD-10-CM | POA: Diagnosis not present

## 2020-09-09 NOTE — Therapy (Signed)
[facility] [address] [place], [place], [address] Phone: [phone]   Fax:  [phone]  Physical Therapy Evaluation  Patient Details  Name: Todd Harris MRN: [id number] Date of Birth: [birth year] Referring Provider (PT): Frann Rider, NP   Encounter Date: 09/09/2020   PT End of Session - 09/09/20 1638     Visit Number 1    Number of Visits 9    Date for PT Re-Evaluation 11/08/20    Authorization Type BCBS    PT Start Time 0854    PT Stop Time 0939    PT Time Calculation (min) 45 min    Activity Tolerance Patient tolerated treatment well    Behavior During Therapy [facility] for tasks assessed/performed             Past Medical History:  Diagnosis Date   Anxiety    Atrial arrhythmia    Barrett esophagus    Bipolar 1 disorder ([place])    Cavernous angioma    Depression    History of chicken pox    Hypertension    Seizures ([place])    1 in 2005, 1 in 2016    Sleep apnea    Sleep apnea    Stroke ([place]) 11/2014   No residual deficits   Venous malformation    Brain    Past Surgical History:  Procedure Laterality Date   APPENDECTOMY  1970   age 14   bone spur     age 71   kidney stones     RAD ONC MR BRAIN GAMMA KNIFE  12/05/2016   TONSILLECTOMY  [id number]   UMBILICAL HERNIA REPAIR N/A 07/24/2018   Procedure: HERNIA REPAIR UMBILICAL ADULT;  Surgeon: Stark Klein, MD;  Location: [facility];  Service: General;  Laterality: N/A;    Vitals:   09/09/20 0906  BP: 122/80  Pulse: 65      Subjective Assessment - 09/09/20 0901     Subjective Dizziness has been getting worse for the past year and then intensified over the past few months.  Took Meclizine without any improvement.  Took himself off two medications that have side effects of dizziness without any change in dizziness.  Feels the dizziness more with exertion, elevated surface - tipped over on a foot stool one time.  Neurology has ordered more imaging but  hasn't been scheduled yet.  Pt was a patient at [facility] a few years ago for similar dizziness; exertion did not affect dizziness a few years ago like it does now.  Whole L side also goes numb with exertion.  Once he sits and rests, it takes about 20-30 minutes for symptoms to settle and pt is very fatigued afterwards.  Naps in the afternoons.    Pertinent History multiple chronic cavernous malformations, R pontine lesion, Bipolar I disorder, OSA, seizures, anxiety, atrial arrhythmia, barrett esophagus, depression, HTN    Patient Stated Goals To help the dizziness; pt has to travel for work - going to [place] in October    Currently in Pain? No/denies                [facility] PT Assessment - 09/09/20 0908       Assessment   Medical Diagnosis Central Vertigo    Referring Provider (PT) Frann Rider, NP    Onset Date/Surgical Date 09/07/20    Prior Therapy yes [facility]      Precautions   Precautions Other (comment)    Precaution Comments h/o multiple chronic cavernous  malformations, R pontine lesion, Bipolar I disorder, OSA, seizures, anxiety, atrial arrhythmia, barrett esophagus, depression, HTN      Balance Screen   Has the patient fallen in the past 6 months Yes    How many times? 1   stool tipped over   Has the patient had a decrease in activity level because of a fear of falling?  Yes      [place] residence    Additional Comments Exertion with daily activities 5-10 minutes cause dizziness, increased tinnitus and numbness on L side.  Dizziness with first entering car but not when driving.  Very fatigued during the day      Prior Function   Level of Independence Independent    Vocation Full time employment;Works at home    [employer] work; travels for work      Cognition   Overall Cognitive Status Within [employer] for tasks assessed      Observation/Other Assessments   Focus on Therapeutic Outcomes  (FOTO)  DFS: 49; DPS: 55.6      Sensation   Light Touch Impaired Detail    Light Touch Impaired Details Impaired LUE;Impaired LLE    Additional Comments No changes in vision, speech and swallowing.  Always numb on L side but with exertion numbness and tingling increases and pt has a harder time holding items in LUE and typing with LUE      Coordination   Gross Motor Movements are Fluid and Coordinated No    Finger Nose Finger Test Overshooting LUE    Heel Shin Test Dysmetric      ROM / Strength   AROM / PROM / Strength Strength      Strength   Overall Strength Comments RUE: 52lb grip; LUE: 17.8 lb.   RLE: 5/5.  LLE: 2+-3/5 but inconsistent force and activation, gives suddenly under resistance      Ambulation/Gait   Ambulation/Gait Yes    Ambulation/Gait Assistance 7: Independent    Ambulation Distance (Feet) 150 Feet    Assistive device None    Gait Pattern Step-through pattern;Decreased step length - right;Decreased step length - left;Decreased stride length;Decreased trunk rotation;Wide base of support    Ambulation Surface Level;Indoor              Vestibular Assessment - 09/09/20 0920       Vestibular Assessment   General Observation Attempted to clean out attic, 20 minutes in had to stop due to dizziness and took a 90 minute nap      Symptom Behavior   Type of Dizziness  Imbalance;Spinning    Frequency of Dizziness daily    Duration of Dizziness 20-30 once resting    Symptom Nature Motion provoked;Constant   worse with exertion   Aggravating Factors Activity in general;Forward bending;Sitting with head tilted back;Looking up to the ceiling    Relieving Factors Rest    Progression of Symptoms Worse      Oculomotor Exam   Oculomotor Alignment Abnormal    Spontaneous Absent    Gaze-induced  Absent    Smooth Pursuits Intact   pt reports nausea   Saccades Slow   Nausea   Comment exophoria with cover-uncover test, dizziness      Oculomotor Exam-Fixation  Suppressed    Left Head Impulse dizziness but unable to assess VOR due to blinking/startle    Right Head Impulse dizziness but unable to assess VOR due to blinking/startle      Vestibulo-Ocular  Reflex   VOR to Slow Head Movement Comment   slight dizziness   VOR Cancellation Comment   dizziness     Positional Sensitivities   Nose to Right Knee Mild dizziness    Right Knee to Sitting Mild dizziness    Nose to Left Knee Mild dizziness    Left Knee to Sitting Mild dizziness    Head Turning x 5 No dizziness    Head Nodding x 5 Moderate dizziness   only able to complete 3   Pivot Right in Standing Mild dizziness    Pivot Left in Standing Mild dizziness                Objective measurements completed on examination: See above findings.     PT Education - 09/09/20 1638     Education Details clinical findings, PT POC and goals    Person(s) Educated Patient    Methods Explanation    Comprehension Verbalized understanding              PT Short Term Goals - 09/09/20 1657       PT SHORT TERM GOAL #1   Title Pt will complete vestibular assessment and will initiate vestibular HEP    Time 4    Period Weeks    Status New    Target Date 10/09/20      PT SHORT TERM GOAL #2   Title Pt will complete 2 or 6 minute walk test to assess how exertion affects symptoms    Time 4    Period Weeks    Status New    Target Date 10/09/20               PT Long Term Goals - 09/09/20 1659       PT LONG TERM GOAL #1   Title Pt will demonstrate independence with final HEP    Time 8    Period Weeks    Status New    Target Date 11/08/20      PT LONG TERM GOAL #2   Title Pt will report 0-1/5 on all movements on MSQ to improve ability to perform daily activities    Time 8    Period Weeks    Status New    Target Date 11/08/20      PT LONG TERM GOAL #3   Title 2 or 6 minute walk test goal      PT LONG TERM GOAL #4   Title Pt will report ability to perform daily activities  for >40 minutes before requiring rest break    Time 8    Period Weeks    Status New    Target Date 11/08/20      PT LONG TERM GOAL #5   Title Pt will report on FOTO improvement in DFS to >/= 54 and DPS to >/= 60    Baseline 49 and 55.6    Time 8    Period Weeks    Status New    Target Date 11/08/20                    Plan - 09/09/20 1641     Clinical Impression Statement Pt is a 60 year old male well known to this clinic, referred to Neuro [facility] for evaluation of chronic dizziness.  Pt's PMH is significant for the following: h/o multiple chronic cavernous malformations, R pontine lesion, Bipolar I disorder, OSA, seizures, anxiety, atrial arrhythmia, barrett esophagus, depression, HTN. The following  deficits were noted during pt's exam: impaired sensation on L side that is worse with exertion, LUE and LLE weakness, impaired coordination, disequilibrium, motion sensitivity, impaired standing balance, difficulty walking and impaired activity tolerance placing patient at increased risk for falls.  Pt is demonstrating multiple central signs; PT to continue to assess and discuss with neurology.  Pt would benefit from skilled PT to address these impairments and functional limitations to maximize functional mobility independence and reduce falls risk.    Personal Factors and Comorbidities Comorbidity 3+;Fitness;Past/Current Experience;Profession    Comorbidities h/o multiple chronic cavernous malformations, R pontine lesion, Bipolar I disorder, OSA, seizures, anxiety, atrial arrhythmia, barrett esophagus, depression, HTN    Examination-Activity Limitations Bend;Locomotion Level;Reach Overhead;Stairs    Examination-Participation Restrictions Cleaning;Laundry;Meal Prep;Occupation;Yard Work    Stability/Clinical Decision Making Unstable/Unpredictable    Surveyor, mining    Rehab Potential Good    PT Frequency 1x / week    PT Duration 8 weeks    PT Treatment/Interventions  ADLs/Self Care Home Management;Canalith Repostioning;Gait training;Stair training;Functional mobility training;Therapeutic activities;Therapeutic exercise;Balance training;Neuromuscular re-education;Patient/family education;Vestibular;Energy conservation    PT Next Visit Plan Did he contact psychiatrist about medication?  Finish vestibular eval - MSQ, 2/6 minute walk and check vitals/dizziness.  HEP.  Discuss findings with neurology.    Consulted and Agree with Plan of Care Patient             Patient will benefit from skilled therapeutic intervention in order to improve the following deficits and impairments:  Abnormal gait, Decreased activity tolerance, Decreased balance, Decreased coordination, Decreased endurance, Decreased mobility, Decreased strength, Difficulty walking, Dizziness, Impaired sensation, Impaired UE functional use  Visit Diagnosis: Dizziness and giddiness  Unsteadiness on feet  Other lack of coordination  Other disturbances of skin sensation  Muscle weakness (generalized)  Other abnormalities of gait and mobility     Problem List Patient Active Problem List   Diagnosis Date Noted   Dyslipidemia 04/14/2020   Vitamin D insufficiency 05/13/2019   Vertigo of central origin 02/01/2018   GAD (generalized anxiety disorder) 12/06/2017   Bipolar I disorder, moderate, current or most recent episode depressed, with anxious distress ([place]) 12/06/2017   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[place]) 05/17/2017   Insomnia 05/17/2017   OSA on CPAP 05/17/2017   Seizures ([place]) 09/28/2016   Cavernous hemangioma of brain ([place]) 12/30/2014   HTN (hypertension) 12/30/2014   Rico Junker, PT, DPT 09/09/20    5:17 PM    [facility] [address] [place], [place], [address] Phone: [phone]   Fax:  [phone]  Name: Todd Harris MRN: [id number] Date of Birth: February 05, 1960

## 2020-09-13 ENCOUNTER — Encounter: Payer: Self-pay | Admitting: Adult Health

## 2020-09-14 ENCOUNTER — Telehealth: Payer: Self-pay | Admitting: Adult Health

## 2020-09-14 NOTE — Telephone Encounter (Signed)
MR Brain w/wo contrast Todd Harris BCBS auth: [employer] spoke to [place] ref # [id number]. Patient is scheduled at [facility] for 09/15/20.

## 2020-09-15 ENCOUNTER — Ambulatory Visit: Payer: BC Managed Care – PPO

## 2020-09-15 ENCOUNTER — Encounter: Payer: Self-pay | Admitting: Family Medicine

## 2020-09-15 DIAGNOSIS — H814 Vertigo of central origin: Secondary | ICD-10-CM

## 2020-09-15 DIAGNOSIS — I613 Nontraumatic intracerebral hemorrhage in brain stem: Secondary | ICD-10-CM

## 2020-09-15 DIAGNOSIS — D1802 Hemangioma of intracranial structures: Secondary | ICD-10-CM

## 2020-09-15 MED ORDER — GADOBENATE DIMEGLUMINE 529 MG/ML IV SOLN
20.0000 mL | Freq: Once | INTRAVENOUS | Status: AC | PRN
  Administered 2020-09-15: 20 mL via INTRAVENOUS

## 2020-09-16 ENCOUNTER — Other Ambulatory Visit: Payer: Self-pay

## 2020-09-16 MED ORDER — IPRATROPIUM BROMIDE 0.03 % NA SOLN: 2.0000 | Freq: Two times a day (BID) | NASAL | 0 refills | Status: DC

## 2020-09-17 ENCOUNTER — Other Ambulatory Visit: Payer: Self-pay

## 2020-09-17 ENCOUNTER — Ambulatory Visit: Payer: BC Managed Care – PPO | Admitting: Physical Therapy

## 2020-09-17 DIAGNOSIS — H8111 Benign paroxysmal vertigo, right ear: Secondary | ICD-10-CM | POA: Diagnosis not present

## 2020-09-17 DIAGNOSIS — R42 Dizziness and giddiness: Secondary | ICD-10-CM | POA: Diagnosis not present

## 2020-09-17 DIAGNOSIS — R278 Other lack of coordination: Secondary | ICD-10-CM | POA: Diagnosis not present

## 2020-09-17 DIAGNOSIS — M6281 Muscle weakness (generalized): Secondary | ICD-10-CM | POA: Diagnosis not present

## 2020-09-17 DIAGNOSIS — R2689 Other abnormalities of gait and mobility: Secondary | ICD-10-CM | POA: Diagnosis not present

## 2020-09-17 DIAGNOSIS — R208 Other disturbances of skin sensation: Secondary | ICD-10-CM | POA: Diagnosis not present

## 2020-09-17 DIAGNOSIS — R2681 Unsteadiness on feet: Secondary | ICD-10-CM

## 2020-09-19 NOTE — Therapy (Signed)
[facility] [address] [place], [place], [address] Phone: [phone]   Fax:  [phone]  Physical Therapy Treatment  Patient Details  Name: Todd Harris MRN: [id number] Date of Birth: December 22, 1960 Referring Provider (PT): Frann Rider, NP   Encounter Date: 09/17/2020    09/17/20 0848  PT Visits / Re-Eval  Visit Number 2  Number of Visits 9  Date for PT Re-Evaluation 11/08/20  Authorization  Authorization Type BCBS  PT Time Calculation  PT Start Time 0803  PT Stop Time 0846  PT Time Calculation (min) 43 min  PT - End of Session  Activity Tolerance Patient tolerated treatment well  Behavior During Therapy [facility] for tasks assessed/performed    Past Medical History:  Diagnosis Date   Anxiety    Atrial arrhythmia    Barrett esophagus    Bipolar 1 disorder ([place])    Cavernous angioma    Depression    History of chicken pox    Hypertension    Seizures ([place])    1 in 2005, 1 in 2016    Sleep apnea    Sleep apnea    Stroke ([place]) 11/2014   No residual deficits   Venous malformation    Brain    Past Surgical History:  Procedure Laterality Date   APPENDECTOMY  1970   age 29   bone spur     age 43   kidney stones     RAD ONC MR BRAIN GAMMA KNIFE  12/05/2016   TONSILLECTOMY  [id number]   UMBILICAL HERNIA REPAIR N/A 07/24/2018   Procedure: HERNIA REPAIR UMBILICAL ADULT;  Surgeon: Stark Klein, MD;  Location: [facility];  Service: General;  Laterality: N/A;    There were no vitals filed for this visit.  09/17/20 0810  Symptoms/Limitations  Subjective Feeling about the same; started back on medications.  Had MRI on Wednesday; no results yet.  Is likely to go to the [place] in October for 3 weeks.  Pertinent History multiple chronic cavernous malformations, R pontine lesion, Bipolar I disorder, OSA, seizures, anxiety, atrial arrhythmia, barrett esophagus, depression, HTN  Patient Stated Goals To help the  dizziness; pt has to travel for work - going to [place] in October  Pain Assessment  Currently in Pain? No/denies      09/17/20 0810  6 Minute Walk- Baseline  6 Minute Walk- Baseline y  BP (mmHg) 120/79  HR (bpm) 70  02 Sat (%RA) 97 %  Modified Borg Scale for Dyspnea 0- Nothing at all (2/10 dizziness, 2/10 LLE numbness)  Perceived Rate of Exertion (Borg) 7- Very, very light  6 Minute walk- Post Test  6 Minute Walk Post Test y  BP (mmHg) 128/84  HR (bpm) 75  02 Sat (%RA) 98 %  Modified Borg Scale for Dyspnea 2- Mild shortness of breath  Perceived Rate of Exertion (Borg) 11- Fairly light (numbness increased at about 3 min, 4/10; dizziness increased to 4/10 especially with head turns; 3-4 minutes for symptoms to resolve.  No HA, no changes in vision, no changes in speech.)  6 minute walk test results   Aerobic Endurance Distance Walked 1175  Endurance additional comments no AD     09/17/20 0825  Positional Testing  Dix-Hallpike Dix-Hallpike Right;Dix-Hallpike Left  Horizontal Canal Testing Horizontal Canal Right;Horizontal Canal Left  Dix-Hallpike Right  Dix-Hallpike Right Duration 30 seconds  Dix-Hallpike Right Symptoms Upbeat, right rotatory nystagmus  Dix-Hallpike Left  Dix-Hallpike Left Duration 0  Dix-Hallpike Left Symptoms No  nystagmus  Horizontal Canal Right  Horizontal Canal Right Duration 0  Horizontal Canal Right Symptoms Normal  Horizontal Canal Left  Horizontal Canal Left Duration 0  Horizontal Canal Left Symptoms Normal      09/17/20 0832  Vestibular Treatment/Exercise  Vestibular Treatment Provided Canalith Repositioning  Canalith Repositioning Epley Manuever Right   EPLEY MANUEVER RIGHT  Number of Reps  1  Overall Response No change  Response Details  strong spinning sensation when sitting upright      09/17/20 0846  PT Education  Education Details evidence of R BPPV; treatment for BPPV but does not explain increase in numbness on L side   Person(s) Educated Patient  Methods Explanation  Comprehension Verbalized understanding       PT Short Term Goals - 09/09/20 1657       PT SHORT TERM GOAL #1   Title Pt will complete vestibular assessment and will initiate vestibular HEP    Time 4    Period Weeks    Status New    Target Date 10/09/20      PT SHORT TERM GOAL #2   Title Pt will complete 2 or 6 minute walk test to assess how exertion affects symptoms    Time 4    Period Weeks    Status New    Target Date 10/09/20               PT Long Term Goals - 09/09/20 1659       PT LONG TERM GOAL #1   Title Pt will demonstrate independence with final HEP    Time 8    Period Weeks    Status New    Target Date 11/08/20      PT LONG TERM GOAL #2   Title Pt will report 0-1/5 on all movements on MSQ to improve ability to perform daily activities    Time 8    Period Weeks    Status New    Target Date 11/08/20      PT LONG TERM GOAL #3   Title 2 or 6 minute walk test goal      PT LONG TERM GOAL #4   Title Pt will report ability to perform daily activities for >40 minutes before requiring rest break    Time 8    Period Weeks    Status New    Target Date 11/08/20      PT LONG TERM GOAL #5   Title Pt will report on FOTO improvement in DFS to >/= 54 and DPS to >/= 60    Baseline 49 and 55.6    Time 8    Period Weeks    Status New    Target Date 11/08/20                   Plan - 09/19/20 1906     Clinical Impression Statement Completed assessment of vestibular symptoms during exertion with 6 minute walk with pt reporting 2 point increase in numbness and dizziness with exertion; resolved after 3-4 minute rest break.  Also continued assessment of vestibular system including positional testing.  Pt did demonstrate positive R dix-hallpike with vertigo and nystagmus of short duration.  Treated with CRM x 1; unable to re-assess Dix-hallpike after treatment; will re-assess and continue to treat as  indicated at next session.    Personal Factors and Comorbidities Comorbidity 3+;Fitness;Past/Current Experience;Profession    Comorbidities h/o multiple chronic cavernous malformations, R pontine lesion, Bipolar I disorder,  OSA, seizures, anxiety, atrial arrhythmia, barrett esophagus, depression, HTN    Examination-Activity Limitations Bend;Locomotion Level;Reach Overhead;Stairs    Examination-Participation Restrictions Cleaning;Laundry;Meal Prep;Occupation;Yard Work    Stability/Clinical Decision Making Unstable/Unpredictable    Rehab Potential Good    PT Frequency 1x / week    PT Duration 8 weeks    PT Treatment/Interventions ADLs/Self Care Home Management;Canalith Repostioning;Gait training;Stair training;Functional mobility training;Therapeutic activities;Therapeutic exercise;Balance training;Neuromuscular re-education;Patient/family education;Vestibular;Energy conservation    PT Next Visit Plan MRI results?  check for R BPPV and treat if indicated; initiate HEP.  Discuss findings with neurology.    Consulted and Agree with Plan of Care Patient             Patient will benefit from skilled therapeutic intervention in order to improve the following deficits and impairments:  Abnormal gait, Decreased activity tolerance, Decreased balance, Decreased coordination, Decreased endurance, Decreased mobility, Decreased strength, Difficulty walking, Dizziness, Impaired sensation, Impaired UE functional use  Visit Diagnosis: Dizziness and giddiness  Unsteadiness on feet  Other abnormalities of gait and mobility  BPPV (benign paroxysmal positional vertigo), right     Problem List Patient Active Problem List   Diagnosis Date Noted   Dyslipidemia 04/14/2020   Vitamin D insufficiency 05/13/2019   Vertigo of central origin 02/01/2018   GAD (generalized anxiety disorder) 12/06/2017   Bipolar I disorder, moderate, current or most recent episode depressed, with anxious distress ([place])  12/06/2017   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[place]) 05/17/2017   Insomnia 05/17/2017   OSA on CPAP 05/17/2017   Seizures ([place]) 09/28/2016   Cavernous hemangioma of brain ([place]) 12/30/2014   HTN (hypertension) 12/30/2014    Rico Junker, PT, DPT 09/19/20    7:15 PM    [facility] [address] [place], [place], [address] Phone: [phone]   Fax:  [phone]  Name: Todd [facility] MRN: [id number] Date of Birth: [birth year]

## 2020-09-20 ENCOUNTER — Encounter: Payer: Self-pay | Admitting: Adult Health

## 2020-09-24 ENCOUNTER — Ambulatory Visit: Payer: BC Managed Care – PPO | Admitting: Physical Therapy

## 2020-09-24 ENCOUNTER — Other Ambulatory Visit: Payer: Self-pay

## 2020-09-24 DIAGNOSIS — R208 Other disturbances of skin sensation: Secondary | ICD-10-CM | POA: Diagnosis not present

## 2020-09-24 DIAGNOSIS — R2681 Unsteadiness on feet: Secondary | ICD-10-CM | POA: Diagnosis not present

## 2020-09-24 DIAGNOSIS — R42 Dizziness and giddiness: Secondary | ICD-10-CM

## 2020-09-24 DIAGNOSIS — R2689 Other abnormalities of gait and mobility: Secondary | ICD-10-CM | POA: Diagnosis not present

## 2020-09-24 DIAGNOSIS — R278 Other lack of coordination: Secondary | ICD-10-CM | POA: Diagnosis not present

## 2020-09-24 DIAGNOSIS — H8111 Benign paroxysmal vertigo, right ear: Secondary | ICD-10-CM

## 2020-09-24 DIAGNOSIS — M6281 Muscle weakness (generalized): Secondary | ICD-10-CM | POA: Diagnosis not present

## 2020-09-24 NOTE — Therapy (Signed)
[facility] [address] [place], [place], [address] Phone: [phone]   Fax:  [phone]  Physical Therapy Treatment  Patient Details  Name: Todd Harris MRN: [id number] Date of Birth: 1960-06-07 Referring Provider (PT): Frann Rider, NP   Encounter Date: 09/24/2020   PT End of Session - 09/24/20 0807     Visit Number 3    Number of Visits 9    Date for PT Re-Evaluation 11/08/20    Authorization Type BCBS    PT Start Time 0803    PT Stop Time 0850    PT Time Calculation (min) 47 min    Activity Tolerance Patient tolerated treatment well    Behavior During Therapy [facility] for tasks assessed/performed             Past Medical History:  Diagnosis Date   Anxiety    Atrial arrhythmia    Barrett esophagus    Bipolar 1 disorder ([place])    Cavernous angioma    Depression    History of chicken pox    Hypertension    Seizures ([place])    1 in 2005, 1 in 2016    Sleep apnea    Sleep apnea    Stroke ([place]) 11/2014   No residual deficits   Venous malformation    Brain    Past Surgical History:  Procedure Laterality Date   APPENDECTOMY  1970   age 67   bone spur     age 26   kidney stones     RAD ONC MR BRAIN GAMMA KNIFE  12/05/2016   TONSILLECTOMY  [id number]   UMBILICAL HERNIA REPAIR N/A 07/24/2018   Procedure: HERNIA REPAIR UMBILICAL ADULT;  Surgeon: Stark Klein, MD;  Location: [facility];  Service: General;  Laterality: N/A;    There were no vitals filed for this visit.   Subjective Assessment - 09/24/20 0808     Subjective Got results of MRI, no acute abnormalities.  Leaves for the [place] on the 19th of October through November 12th.  Still having symptoms with exertion.    Pertinent History multiple chronic cavernous malformations, R pontine lesion, Bipolar I disorder, OSA, seizures, anxiety, atrial arrhythmia, barrett esophagus, depression, HTN    Patient Stated Goals To help the dizziness; pt has to  travel for work - going to [place] in October    Currently in Pain? No/denies                Vestibular Assessment - 09/24/20 0818       Dix-Hallpike Right   Dix-Hallpike Right Duration 2-3 seconds    Dix-Hallpike Right Symptoms Upbeat, right rotatory nystagmus             [place] Treadmill Test  Min HR RPE Overall condition (Likert Scale) Symptoms/Observations  REST  72  6  1/10  Dizziness and tingling  Unable to tolerate recommended speed - pt able to perform test at 1.8 mph  0  86  6  2/10   1  93  9  3/10   2  89  9 3/10   3  93  9 3/10   4  95  9 3/10   5  95   9 3.5/10   6  100  14  4/10   7  106  14  4/10   8  110  14  5/10  Unsteadiness, dizziness, tingling into LLE  Post-Exercise (reduce speed to 1.0 mph x 2  minute cool down + 10 min seated rest break)  10  72   1/10   Dizziness and tingling back to baseline       Vestibular Treatment/Exercise - 09/24/20 0819       Vestibular Treatment/Exercise   Vestibular Treatment Provided Canalith Repositioning    Canalith Repositioning Epley Manuever Right       EPLEY MANUEVER RIGHT   Number of Reps  1    Overall Response Improved Symptoms    Response Details  less intense and shorter duration               PT Short Term Goals - 09/09/20 1657       PT SHORT TERM GOAL #1   Title Pt will complete vestibular assessment and will initiate vestibular HEP    Time 4    Period Weeks    Status New    Target Date 10/09/20      PT SHORT TERM GOAL #2   Title Pt will complete 2 or 6 minute walk test to assess how exertion affects symptoms    Time 4    Period Weeks    Status New    Target Date 10/09/20               PT Long Term Goals - 09/09/20 1659       PT LONG TERM GOAL #1   Title Pt will demonstrate independence with final HEP    Time 8    Period Weeks    Status New    Target Date 11/08/20      PT LONG TERM GOAL #2   Title Pt will report 0-1/5 on all movements on MSQ to improve  ability to perform daily activities    Time 8    Period Weeks    Status New    Target Date 11/08/20      PT LONG TERM GOAL #3   Title 2 or 6 minute walk test goal      PT LONG TERM GOAL #4   Title Pt will report ability to perform daily activities for >40 minutes before requiring rest break    Time 8    Period Weeks    Status New    Target Date 11/08/20      PT LONG TERM GOAL #5   Title Pt will report on FOTO improvement in DFS to >/= 54 and DPS to >/= 60    Baseline 49 and 55.6    Time 8    Period Weeks    Status New    Target Date 11/08/20                   Plan - 09/24/20 0856     Clinical Impression Statement Pt demonstrated improvement in vertigo and intensity/duraiton of nystagmus; treated R BPPV again with 1 CRM.  Due to ongoing symptoms of dizziness and tingling with exertion and no acute findings on MRI, proceeded with further exertional intolerance testing with Treadmill test.  Pt noted to have significant jump in symptoms when HR reached 108-110; required 10 minutes seated cool down for symptoms to return to baseline.  Will initiate progressive exertional training and HEP next session.    Personal Factors and Comorbidities Comorbidity 3+;Fitness;Past/Current Experience;Profession    Comorbidities h/o multiple chronic cavernous malformations, R pontine lesion, Bipolar I disorder, OSA, seizures, anxiety, atrial arrhythmia, barrett esophagus, depression, HTN    Examination-Activity Limitations Bend;Locomotion Level;Reach Overhead;Stairs    Examination-Participation Restrictions  Cleaning;Laundry;Meal Prep;Occupation;Yard Work    Stability/Clinical Decision Making Unstable/Unpredictable    Rehab Potential Good    PT Frequency 1x / week    PT Duration 8 weeks    PT Treatment/Interventions ADLs/Self Care Home Management;Canalith Repostioning;Gait training;Stair training;Functional mobility training;Therapeutic activities;Therapeutic exercise;Balance  training;Neuromuscular re-education;Patient/family education;Vestibular;Energy conservation    PT Next Visit Plan check for R BPPV and treat if indicated.  HEP: progressive exertion walking program with HR no >87-90 bpm.  Keep symptoms at 3/10.  Vestibular HEP-VOR, habituation    Consulted and Agree with Plan of Care Patient             Patient will benefit from skilled therapeutic intervention in order to improve the following deficits and impairments:  Abnormal gait, Decreased activity tolerance, Decreased balance, Decreased coordination, Decreased endurance, Decreased mobility, Decreased strength, Difficulty walking, Dizziness, Impaired sensation, Impaired UE functional use  Visit Diagnosis: Dizziness and giddiness  Unsteadiness on feet  Other abnormalities of gait and mobility  BPPV (benign paroxysmal positional vertigo), right  Other lack of coordination     Problem List Patient Active Problem List   Diagnosis Date Noted   Dyslipidemia 04/14/2020   Vitamin D insufficiency 05/13/2019   Vertigo of central origin 02/01/2018   GAD (generalized anxiety disorder) 12/06/2017   Bipolar I disorder, moderate, current or most recent episode depressed, with anxious distress ([place]) 12/06/2017   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[place]) 05/17/2017   Insomnia 05/17/2017   OSA on CPAP 05/17/2017   Seizures ([place]) 09/28/2016   Cavernous hemangioma of brain ([place]) 12/30/2014   HTN (hypertension) 12/30/2014    Rico Junker, PT, DPT 09/24/20    9:06 AM    [facility] [address] [place], [place], [address] Phone: [phone]   Fax:  [phone]  Name: CAMP GOPAL MRN: [id number] Date of Birth: 1960/09/16

## 2020-10-01 ENCOUNTER — Ambulatory Visit: Payer: BC Managed Care – PPO | Admitting: Physical Therapy

## 2020-10-01 ENCOUNTER — Other Ambulatory Visit: Payer: Self-pay

## 2020-10-01 DIAGNOSIS — R42 Dizziness and giddiness: Secondary | ICD-10-CM | POA: Diagnosis not present

## 2020-10-01 DIAGNOSIS — R278 Other lack of coordination: Secondary | ICD-10-CM | POA: Diagnosis not present

## 2020-10-01 DIAGNOSIS — R2689 Other abnormalities of gait and mobility: Secondary | ICD-10-CM | POA: Diagnosis not present

## 2020-10-01 DIAGNOSIS — M6281 Muscle weakness (generalized): Secondary | ICD-10-CM | POA: Diagnosis not present

## 2020-10-01 DIAGNOSIS — R2681 Unsteadiness on feet: Secondary | ICD-10-CM | POA: Diagnosis not present

## 2020-10-01 DIAGNOSIS — H8111 Benign paroxysmal vertigo, right ear: Secondary | ICD-10-CM | POA: Diagnosis not present

## 2020-10-01 DIAGNOSIS — R208 Other disturbances of skin sensation: Secondary | ICD-10-CM | POA: Diagnosis not present

## 2020-10-01 NOTE — Patient Instructions (Addendum)
Access Code: [email or web address] URL: [email or web address] Date: 10/01/2020 Prepared by: Misty Stanley  Exercises Eyes Stable - Head says "No" - 1 x daily - 7 x weekly - 2 sets - 45 seconds hold Eyes Stable - Head says "Yes" - 1 x daily - 7 x weekly - 2 sets - 30 seconds hold Standing Narrow Base of Support - 1 x daily - 7 x weekly - 2 sets - 5 reps    [email or web address]   Exertion Training Protocol - Adapted from the [facility] Protocol Monday Floor bike or seated bike/stepper  5 min warm up, light resistance/low speed 1 min moderate resistance/increase speed 2 min recovery, light resistance/low speed 1 min moderate resistance/increase speed 5 min cool down, light resistance/low speed Tuesday  Strength Train Wednesday  Floor bike or seated bike/stepper 5 min warm up, light resistance/low speed 1 min moderate resistance/increase speed 2 min recovery, light resistance/low speed 1 min moderate resistance/increase speed 5 min cool down, light resistance/low speed Thursday  Strength Train Friday Floor bike or seated bike/stepper  5 min warm up, light resistance/low speed 1 min moderate resistance/increase speed 2 min recovery, light resistance/low speed 1 min moderate resistance/increase speed 5 min cool down, light resistance/low speed  Floor bike or seated bike/stepper  5 min warm up, light resistance/low speed 1.5 min moderate resistance/increase speed 3.5 min recovery, light resistance/low speed 1.5 min moderate resistance/increase speed 5 min cool down, light resistance/low speed  Strength Train Floor bike or seated bike/stepper  5 min warm up, light resistance/low speed 1.5 min moderate resistance/increase speed 3.5 min recovery, light resistance/low speed 1.5 min moderate resistance/increase speed 5 min cool down, light resistance/low speed  Strength Train Floor bike or seated bike/stepper  5  min warm up, light resistance/low speed 1.5 min moderate resistance/increase speed 3.5 min recovery, light resistance/low speed 1.5 min moderate resistance/increase speed 5 min cool down, light resistance/low speed  Floor bike or seated bike/stepper  5 min warm up, light resistance/low speed 2 min moderate resistance/increase speed 3 min recovery, light resistance/low speed 2 min moderate resistance/increase speed 5 min cool down, light resistance/low speed  Strength Train Floor bike or seated bike/stepper  5 min warm up, light resistance/low speed 2 min moderate resistance/increase speed 3 min recovery, light resistance/low speed 2 min moderate resistance/increase speed 5 min cool down, light resistance/low speed  Strength Train Floor bike or seated bike/stepper  5 min warm up, light resistance/low speed 2 min moderate resistance/increase speed 3 min recovery, light resistance/low speed 2 min moderate resistance/increase speed 5 min cool down, light resistance/low speed  Floor bike or seated bike/stepper  5 min warm up, light resistance/low speed 2.5 min moderate resistance/increase speed 3.5 min recovery, light resistance/low speed 2.5 min moderate resistance/increase speed 5 min cool down, light resistance/low speed  Strength Train Floor bike or seated bike/stepper  5 min warm up, light resistance/low speed 2.5 min moderate resistance/increase speed 3.5 min recovery, light resistance/low speed 2.5 min moderate resistance/increase speed 5 min cool down, light resistance/low speed  Strength Train Floor bike or seated bike/stepper  5 min warm up, light resistance/low speed 2.5 min moderate resistance/increase speed 3.5 min recovery, light resistance/low speed 2.5 min moderate resistance/increase speed 5 min cool down, light resistance/low speed   Keep Heart Rate between 87-97 bpm and tingling/dizziness symptoms at 3.5 or below

## 2020-10-01 NOTE — Therapy (Signed)
[facility] [address] [place], [place], [address] Phone: [phone]   Fax:  [phone]  Physical Therapy Treatment  Patient Details  Name: Todd Harris MRN: [id number] Date of Birth: [birth year] Referring Provider (PT): Frann Rider, NP   Encounter Date: 10/01/2020   PT End of Session - 10/01/20 0812     Visit Number 4    Number of Visits 9    Date for PT Re-Evaluation 11/08/20    Authorization Type BCBS    PT Start Time 0803    PT Stop Time 0858    PT Time Calculation (min) 55 min    Activity Tolerance Patient tolerated treatment well    Behavior During Therapy [facility] for tasks assessed/performed             Past Medical History:  Diagnosis Date   Anxiety    Atrial arrhythmia    Barrett esophagus    Bipolar 1 disorder ([place])    Cavernous angioma    Depression    History of chicken pox    Hypertension    Seizures ([place])    1 in 2005, 1 in 2016    Sleep apnea    Sleep apnea    Stroke ([place]) 11/2014   No residual deficits   Venous malformation    Brain    Past Surgical History:  Procedure Laterality Date   APPENDECTOMY  1970   age 23   bone spur     age 83   kidney stones     RAD ONC MR BRAIN GAMMA KNIFE  12/05/2016   TONSILLECTOMY  [id number]   UMBILICAL HERNIA REPAIR N/A 07/24/2018   Procedure: HERNIA REPAIR UMBILICAL ADULT;  Surgeon: Stark Klein, MD;  Location: [facility];  Service: General;  Laterality: N/A;    There were no vitals filed for this visit.   Subjective Assessment - 10/01/20 0929     Subjective Has not noticed any spinning but also tries to avoid positions that bring on symptoms.  Had to move some chairs around and had increased symptoms of dizziness and tingling.    Pertinent History multiple chronic cavernous malformations, R pontine lesion, Bipolar I disorder, OSA, seizures, anxiety, atrial arrhythmia, barrett esophagus, depression, HTN    Patient Stated Goals To help the  dizziness; pt has to travel for work - going to [place] in October    Currently in Pain? No/denies              [facility] Adult PT Treatment/Exercise - 10/01/20 0925       Exercises   Exercises Knee/Hip      Knee/Hip Exercises: Aerobic   Nustep No UE: Level 5 for warm up x 5 minutes + 1 minute faster pace at level 7 resistance - pt HR increased to 98 and tingling 3.5 - 2 minute recovery at level 5.0 resistance and slower pace to allow HR to return to 87 and tingling back to baseline. Performed 9 minutes total.  Did not require 10 minute recovery as compared to treadmill.  Discussed ways to perform at home; pt to look into floor pedal exerciser and when traveling will utilize recumbent bike or seated stepper.             Vestibular Treatment/Exercise - 10/01/20 0831       Vestibular Treatment/Exercise   Vestibular Treatment Provided Gaze    Gaze Exercises X1 Viewing Horizontal;X1 Viewing Vertical      X1 Viewing Horizontal   Foot  Position seated    Reps 2    Comments 15 seconds > 30 seconds, mild symptoms      X1 Viewing Vertical   Foot Position seated    Reps 2    Comments 45 > 60 seconds mild symptom                Balance Exercises - 10/01/20 0832       Balance Exercises: Standing   Standing Eyes Opened Narrow base of support (BOS);Head turns;Solid surface;5 reps;Limitations    Standing Eyes Opened Limitations feet together > feet narrow stance with 5 reps x 2 sets vertical head nods, 10 reps head turns                PT Education - 10/01/20 0917     Education Details initiated HEP for balance/vestibular, continued to review recommendations for exertional intolerance beginning with seated or recumbent exercise, options for seated exercise at home, Medbridge app and access code    Person(s) Educated Patient    Methods Explanation;Demonstration;Handout    Comprehension Verbalized understanding;Returned demonstration              PT Short Term  Goals - 09/09/20 1657       PT SHORT TERM GOAL #1   Title Pt will complete vestibular assessment and will initiate vestibular HEP    Time 4    Period Weeks    Status New    Target Date 10/09/20      PT SHORT TERM GOAL #2   Title Pt will complete 2 or 6 minute walk test to assess how exertion affects symptoms    Time 4    Period Weeks    Status New    Target Date 10/09/20               PT Long Term Goals - 09/09/20 1659       PT LONG TERM GOAL #1   Title Pt will demonstrate independence with final HEP    Time 8    Period Weeks    Status New    Target Date 11/08/20      PT LONG TERM GOAL #2   Title Pt will report 0-1/5 on all movements on MSQ to improve ability to perform daily activities    Time 8    Period Weeks    Status New    Target Date 11/08/20      PT LONG TERM GOAL #3   Title 2 or 6 minute walk test goal      PT LONG TERM GOAL #4   Title Pt will report ability to perform daily activities for >40 minutes before requiring rest break    Time 8    Period Weeks    Status New    Target Date 11/08/20      PT LONG TERM GOAL #5   Title Pt will report on FOTO improvement in DFS to >/= 54 and DPS to >/= 60    Baseline 49 and 55.6    Time 8    Period Weeks    Status New    Target Date 11/08/20                   Plan - 10/01/20 0907     Clinical Impression Statement Increased time spent with patient today initiating vestibular and balance HEP as well as investigating options for exercise training in seated/recumbent positions.  Pt able to perform 9 minutes of exercise  in seated position with no reports of dizziness and tingling increasing to only 3.5 intensity as HR increased >98 bpm.  Will continue to set up patient with appropriate exercise routine to address exercise intolerance and dizziness prior to trip to [place].    Personal Factors and Comorbidities Comorbidity 3+;Fitness;Past/Current Experience;Profession    Comorbidities h/o multiple  chronic cavernous malformations, R pontine lesion, Bipolar I disorder, OSA, seizures, anxiety, atrial arrhythmia, barrett esophagus, depression, HTN    Examination-Activity Limitations Bend;Locomotion Level;Reach Overhead;Stairs    Examination-Participation Restrictions Cleaning;Laundry;Meal Prep;Occupation;Yard Work    Stability/Clinical Decision Making Unstable/Unpredictable    Rehab Potential Good    PT Frequency 1x / week    PT Duration 8 weeks    PT Treatment/Interventions ADLs/Self Care Home Management;Canalith Repostioning;Gait training;Stair training;Functional mobility training;Therapeutic activities;Therapeutic exercise;Balance training;Neuromuscular re-education;Patient/family education;Vestibular;Energy conservation    PT Next Visit Plan check for R BPPV and treat if indicated.  I emailed him information about floor bike - will he be able to purchase one?  How is he tolerating vestibular HEP?  HEP: progress VOR as able, habituation to walking with head turns, bending down to floor, body turns.  Exertional intolerance training: floor bike or Nustep - see grid - HR between 87-97 bpm.  Keep symptoms at 3/10.    Consulted and Agree with Plan of Care Patient             Patient will benefit from skilled therapeutic intervention in order to improve the following deficits and impairments:  Abnormal gait, Decreased activity tolerance, Decreased balance, Decreased coordination, Decreased endurance, Decreased mobility, Decreased strength, Difficulty walking, Dizziness, Impaired sensation, Impaired UE functional use  Visit Diagnosis: Dizziness and giddiness  Unsteadiness on feet  Other abnormalities of gait and mobility     Problem List Patient Active Problem List   Diagnosis Date Noted   Dyslipidemia 04/14/2020   Vitamin D insufficiency 05/13/2019   Vertigo of central origin 02/01/2018   GAD (generalized anxiety disorder) 12/06/2017   Bipolar I disorder, moderate, current or  most recent episode depressed, with anxious distress ([place]) 12/06/2017   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[place]) 05/17/2017   Insomnia 05/17/2017   OSA on CPAP 05/17/2017   Seizures ([place]) 09/28/2016   Cavernous hemangioma of brain ([place]) 12/30/2014   HTN (hypertension) 12/30/2014    Rico Junker, PT, DPT 10/01/20    9:30 AM    [facility] [address] [place], [place], [address] Phone: [phone]   Fax:  [phone]  Name: Todd Harris MRN: [id number] Date of Birth: 27-Dec-1960

## 2020-10-07 ENCOUNTER — Other Ambulatory Visit: Payer: Self-pay | Admitting: Family Medicine

## 2020-10-07 DIAGNOSIS — I10 Essential (primary) hypertension: Secondary | ICD-10-CM

## 2020-10-08 ENCOUNTER — Ambulatory Visit: Payer: BC Managed Care – PPO

## 2020-10-08 ENCOUNTER — Encounter: Payer: Self-pay | Admitting: Behavioral Health

## 2020-10-08 ENCOUNTER — Ambulatory Visit (INDEPENDENT_AMBULATORY_CARE_PROVIDER_SITE_OTHER): Payer: BC Managed Care – PPO | Admitting: Behavioral Health

## 2020-10-08 ENCOUNTER — Other Ambulatory Visit: Payer: Self-pay

## 2020-10-08 DIAGNOSIS — G4733 Obstructive sleep apnea (adult) (pediatric): Secondary | ICD-10-CM

## 2020-10-08 DIAGNOSIS — F3173 Bipolar disorder, in partial remission, most recent episode manic: Secondary | ICD-10-CM | POA: Diagnosis not present

## 2020-10-08 DIAGNOSIS — F319 Bipolar disorder, unspecified: Secondary | ICD-10-CM

## 2020-10-08 DIAGNOSIS — F3132 Bipolar disorder, current episode depressed, moderate: Secondary | ICD-10-CM

## 2020-10-08 DIAGNOSIS — F411 Generalized anxiety disorder: Secondary | ICD-10-CM | POA: Diagnosis not present

## 2020-10-08 MED ORDER — BUPROPION HCL ER (XL) 300 MG PO TB24: ORAL_TABLET | ORAL | 3 refills | Status: DC

## 2020-10-08 MED ORDER — DIAZEPAM 5 MG PO TABS: 5.0000 mg | ORAL_TABLET | Freq: Two times a day (BID) | ORAL | 3 refills | Status: DC

## 2020-10-08 MED ORDER — CARIPRAZINE HCL 3 MG PO CAPS: 3.0000 mg | ORAL_CAPSULE | Freq: Every day | ORAL | 3 refills | Status: DC

## 2020-10-08 NOTE — Progress Notes (Signed)
[facility]  Patient ID: ETHER WOLTERS,  MRN: [id number]  PCP: Vivi Barrack, MD  Date of Evaluation: 10/08/2020 Time spent:30 minutes  Chief Complaint:  Chief Complaint   Anxiety; Depression; Follow-up; Medication Refill     HISTORY/CURRENT STATUS: HPI 60 year old male presents to this office for follow up and medication management. He is former patient of Dr. Milana Huntsman. Says his anxiety and depression have been well controlled. No mania or depressive episodes since last visit. No changes to medication regimen are indicate at this time. Roney Jaffe he had a pontine stroke approximately 3 year ago. Says that he also has been diagnosed with cavernous angioma brain. Says he is not concerned too much about his mental health right now but has some things with his physical health to work on. He is going to physical therapy due to activity intolerance due to cardiac issues. He reports anxiety today 2/10 and depression 1/10. Says he sleeps 7 hours per night. Reports no mania. No psychosis. No SI/HI.   Could not provide list of past medication trials this visit.   Individual Medical History/ Review of Systems: Changes? :No   Allergies: Patient has no known allergies.  Current Medications:  Current Outpatient Medications:    atorvastatin (LIPITOR) 40 MG tablet, TAKE 1 TABLET BY MOUTH EVERY DAY, Disp: 90 tablet, Rfl: 1   buPROPion (WELLBUTRIN XL) 300 MG 24 hr tablet, Take one tablet daily., Disp: 90 tablet, Rfl: 3   cariprazine (VRAYLAR) 3 MG capsule, Take 1 capsule (3 mg total) by mouth daily., Disp: 90 capsule, Rfl: 3   diazepam (VALIUM) 5 MG tablet, Take 1 tablet (5 mg total) by mouth 2 (two) times daily., Disp: 180 tablet, Rfl: 3   icosapent Ethyl (VASCEPA) 1 g capsule, TAKE 2 CAPSULES BY MOUTH TWICE A DAY, Disp: 360 capsule, Rfl: 3   ipratropium (ATROVENT) 0.03 % nasal spray, INSTILL 2 SPRAYS INTO BOTH NOSTRILS EVERY 12 HOURS., Disp: 30 mL, Rfl: 0   lamoTRIgine (LAMICTAL)  100 MG tablet, Take 1 tablet (100 mg total) by mouth 2 (two) times daily., Disp: 180 tablet, Rfl: 3   lisinopril-hydrochlorothiazide (ZESTORETIC) 20-25 MG tablet, TAKE 1 TABLET BY MOUTH EVERY DAY, Disp: 90 tablet, Rfl: 1   meclizine (MEDI-MECLIZINE) 25 MG tablet, Take 1 tablet (25 mg total) by mouth 3 (three) times daily as needed for dizziness., Disp: 90 tablet, Rfl: 6   metFORMIN (GLUCOPHAGE) 500 MG tablet, Take 1 tablet (500 mg total) by mouth daily with breakfast., Disp: 90 tablet, Rfl: 3   zaleplon (SONATA) 5 MG capsule, Take 1 capsule (5 mg total) by mouth at bedtime as needed. for sleep, Disp: 30 capsule, Rfl: 1 Medication Side Effects: none  Family Medical/ Social History: Changes? No  MENTAL HEALTH EXAM:  There were no vitals taken for this visit.There is no height or weight on file to calculate BMI.  General Appearance: Casual, Neat, and Well Groomed  Eye Contact:  Good  Speech:  Clear and Coherent  Volume:  Normal  Mood:  NA  Affect:  Appropriate  Thought Process:  Coherent  Orientation:  Full (Time, Place, and Person)  Thought Content: Logical   Suicidal Thoughts:  No  Homicidal Thoughts:  No  Memory:  WNL  Judgement:  Good  Insight:  Good  Psychomotor Activity:  Normal  Concentration:  Concentration: Good  Recall:  Good  Fund of Knowledge: Good  Language: Good  Assets:  Desire for Improvement  ADL's:  Intact  Cognition: WNL  Prognosis:  Good    DIAGNOSES:    ICD-10-CM   1. Bipolar I disorder, current or most recent episode manic, in partial remission with anxious distress ([place])  F31.73     2. GAD (generalized anxiety disorder)  F41.1 cariprazine (VRAYLAR) 3 MG capsule    buPROPion (WELLBUTRIN XL) 300 MG 24 hr tablet    diazepam (VALIUM) 5 MG tablet    3. Bipolar I disorder ([facility])  F31.9 cariprazine (VRAYLAR) 3 MG capsule    4. Bipolar I disorder, moderate, current or most recent episode depressed, with anxious distress ([place])  F31.32 buPROPion (WELLBUTRIN XL)  300 MG 24 hr tablet    5. Obstructive sleep apnea  G47.33 diazepam (VALIUM) 5 MG tablet      Receiving Psychotherapy: No    RECOMMENDATIONS:   Continue Vraylar to 3 mg daily Continue Wellbutrin 300 mg XL daily Continue Valium  5 mg tablet twice daily Continue Lamictal 100 mg twice daily Follow up in 6 months per patient to reassess.  To report any worsening symptoms  or side effects promptly Monitor any increase in dizziness and notify if worsens Greater than 50% of 30 min face to face time with patient was spent on counseling and coordination of care. We discussed his current stability with bipolar disorder. He has not experienced any manic or depressive episodes since last visit.  Discussed potential benefits, risk, and side effects of benzodiazepines to include potential risk of tolerance and dependence, as well as possible drowsiness.  Advised patient not to drive if experiencing drowsiness and to take lowest possible effective dose to minimize risk of dependence and tolerance.  6 months of refills provided. Patient will be traveling out of country extensively for job and request 6 month follow up.   PDMP reviewed.  Elwanda Brooklyn, NP

## 2020-10-12 ENCOUNTER — Ambulatory Visit: Payer: BC Managed Care – PPO | Attending: Adult Health | Admitting: Physical Therapy

## 2020-10-12 ENCOUNTER — Other Ambulatory Visit: Payer: Self-pay

## 2020-10-12 DIAGNOSIS — R2689 Other abnormalities of gait and mobility: Secondary | ICD-10-CM | POA: Insufficient documentation

## 2020-10-12 DIAGNOSIS — R2681 Unsteadiness on feet: Secondary | ICD-10-CM | POA: Insufficient documentation

## 2020-10-12 DIAGNOSIS — H8111 Benign paroxysmal vertigo, right ear: Secondary | ICD-10-CM | POA: Insufficient documentation

## 2020-10-12 DIAGNOSIS — R42 Dizziness and giddiness: Secondary | ICD-10-CM | POA: Diagnosis not present

## 2020-10-12 NOTE — Patient Instructions (Addendum)
How to Perform the Epley Maneuver The Epley maneuver is an exercise that relieves symptoms of vertigo. Vertigo is the feeling that you or your surroundings are moving when they are not. When you feel vertigo, you may feel like the room is spinning and have trouble walking. Dizziness is a little different than vertigo. When you are dizzy, you may feel unsteady or light-headed. You can do this maneuver at home whenever you have symptoms of vertigo. You can do it up to 3 times a day until your symptoms go away. Even though the Epley maneuver may relieve your vertigo for a few weeks, it is possible that your symptoms will return. This maneuver relieves vertigo, but it does not relieve dizziness. What are the risks? If it is done correctly, the Epley maneuver is considered safe. Sometimes it can lead to dizziness or nausea that goes away after a short time. If you develop other symptoms, such as changes in vision, weakness, or numbness, stop doing the maneuver and call your health care provider. How to perform the Epley maneuver Sit on the edge of a bed or table with your back straight and your legs extended or hanging over the edge of the bed or table. Turn your head halfway toward the affected ear or side. Lie backward quickly with your head turned until you are lying flat on your back. You may want to position a pillow under your shoulders. Hold this position for 30 seconds. You may experience an attack of vertigo. This is normal. Turn your head to the opposite direction until your unaffected ear is facing the floor. Hold this position for 30 seconds. You may experience an attack of vertigo. This is normal. Hold this position until the vertigo stops. Turn your whole body to the same side as your head. Hold for another 30 seconds. Sit back up. You can repeat this exercise up to 3 times a day.      Exertion Training Protocol - Adapted from the [facility] Protocol Monday Floor bike or seated  bike/stepper  5 min warm up, light resistance/low speed 1 min moderate resistance/increase speed 2 min recovery, light resistance/low speed 1 min moderate resistance/increase speed 5 min cool down, light resistance/low speed Tuesday  Strength Train Wednesday  Floor bike or seated bike/stepper 5 min warm up, light resistance/low speed 1 min moderate resistance/increase speed 2 min recovery, light resistance/low speed 1 min moderate resistance/increase speed 5 min cool down, light resistance/low speed Thursday  Strength Train Friday Floor bike or seated bike/stepper  5 min warm up, light resistance/low speed 1 min moderate resistance/increase speed 2 min recovery, light resistance/low speed 1 min moderate resistance/increase speed 5 min cool down, light resistance/low speed  Floor bike or seated bike/stepper  5 min warm up, light resistance/low speed 1.5 min moderate resistance/increase speed 3.5 min recovery, light resistance/low speed 1.5 min moderate resistance/increase speed 5 min cool down, light resistance/low speed  Strength Train Floor bike or seated bike/stepper  5 min warm up, light resistance/low speed 1.5 min moderate resistance/increase speed 3.5 min recovery, light resistance/low speed 1.5 min moderate resistance/increase speed 5 min cool down, light resistance/low speed  Strength Train Floor bike or seated bike/stepper  5 min warm up, light resistance/low speed 1.5 min moderate resistance/increase speed 3.5 min recovery, light resistance/low speed 1.5 min moderate resistance/increase speed 5 min cool down, light resistance/low speed  Floor bike or seated bike/stepper  5 min warm up, light resistance/low speed 2 min moderate resistance/increase speed 3 min recovery,  light resistance/low speed 2 min moderate resistance/increase speed 5 min cool down, light resistance/low speed  Strength Train Floor bike or seated bike/stepper  5 min warm up,  light resistance/low speed 2 min moderate resistance/increase speed 3 min recovery, light resistance/low speed 2 min moderate resistance/increase speed 5 min cool down, light resistance/low speed  Strength Train Floor bike or seated bike/stepper  5 min warm up, light resistance/low speed 2 min moderate resistance/increase speed 3 min recovery, light resistance/low speed 2 min moderate resistance/increase speed 5 min cool down, light resistance/low speed  Floor bike or seated bike/stepper  5 min warm up, light resistance/low speed 2.5 min moderate resistance/increase speed 3.5 min recovery, light resistance/low speed 2.5 min moderate resistance/increase speed 5 min cool down, light resistance/low speed  Strength Train Floor bike or seated bike/stepper  5 min warm up, light resistance/low speed 2.5 min moderate resistance/increase speed 3.5 min recovery, light resistance/low speed 2.5 min moderate resistance/increase speed 5 min cool down, light resistance/low speed  Strength Train Floor bike or seated bike/stepper  5 min warm up, light resistance/low speed 2.5 min moderate resistance/increase speed 3.5 min recovery, light resistance/low speed 2.5 min moderate resistance/increase speed 5 min cool down, light resistance/low speed   Keep Heart Rate between 87-97 bpm and tingling/dizziness symptoms at 3.5 or below  Access Code: [email or web address] URL: [email or web address] Date: 10/12/2020 Prepared by: Misty Stanley  Exercises Eyes Stable - Head says "No" - 1 x daily - 7 x weekly - 2 sets - 60 seconds hold Eyes Stable - Head says "Yes" - 1 x daily - 7 x weekly - 2 sets - 60 seconds hold Side to Side Weight Shift with Unilateral Counter Support - 1 x daily - 7 x weekly - 2 sets - 10 reps Standing Narrow Base of Support - 1 x daily - 7 x weekly - 2 sets - 5 reps

## 2020-10-13 ENCOUNTER — Other Ambulatory Visit: Payer: Self-pay | Admitting: *Deleted

## 2020-10-13 MED ORDER — ICOSAPENT ETHYL 1 G PO CAPS: 2.0000 g | ORAL_CAPSULE | Freq: Two times a day (BID) | ORAL | 3 refills | Status: DC

## 2020-10-13 NOTE — Progress Notes (Signed)
In regards to heart rate management with exertion, you will need to further speak to his PCP or cardiologist (if he is established with one). Not sure if he would benefit from use of medications as needed but would prefer to leave this up to PCP who is more familiar with his medications.  Thank you for working with Todd Harris and keeping me updated!

## 2020-10-13 NOTE — Therapy (Signed)
[facility] [address] [place], [place], [address] Phone: [phone]   Fax:  [phone]  Physical Therapy Treatment  Patient Details  Name: Todd Harris MRN: [id number] Date of Birth: 05-Sep-1960 Referring Provider (PT): Frann Rider, NP   Encounter Date: 10/12/2020   PT End of Session - 10/12/20 1240     Visit Number 5    Number of Visits 9    Date for PT Re-Evaluation 11/08/20    Authorization Type BCBS    PT Start Time 1236    PT Stop Time 1320    PT Time Calculation (min) 44 min    Activity Tolerance Patient tolerated treatment well    Behavior During Therapy [facility] for tasks assessed/performed             Past Medical History:  Diagnosis Date   Anxiety    Atrial arrhythmia    Barrett esophagus    Bipolar 1 disorder ([place])    Cavernous angioma    Depression    History of chicken pox    Hypertension    Seizures ([place])    1 in 2005, 1 in 2016    Sleep apnea    Sleep apnea    Stroke ([place]) 11/2014   No residual deficits   Venous malformation    Brain    Past Surgical History:  Procedure Laterality Date   APPENDECTOMY  1970   age 89   bone spur     age 15   kidney stones     RAD ONC MR BRAIN GAMMA KNIFE  12/05/2016   TONSILLECTOMY  7253   UMBILICAL HERNIA REPAIR N/A 07/24/2018   Procedure: HERNIA REPAIR UMBILICAL ADULT;  Surgeon: Stark Klein, MD;  Location: [facility];  Service: General;  Laterality: N/A;    There were no vitals filed for this visit.   Subjective Assessment - 10/12/20 1244     Subjective Got floor bike and has been using for 15 minutes.  When HR increased between 107-110 he experiences dizziness, tingling, tinnitus and has to rest.  Leaves for the [place] next week.    Pertinent History multiple chronic cavernous malformations, R pontine lesion, Bipolar I disorder, OSA, seizures, anxiety, atrial arrhythmia, barrett esophagus, depression, HTN    Patient Stated Goals To  help the dizziness; pt has to travel for work - going to [place] in October    Currently in Pain? No/denies                     Vestibular Assessment - 10/12/20 1246       Positional Testing   Dix-Hallpike Dix-Hallpike Right      Dix-Hallpike Right   Dix-Hallpike Right Duration 0    Dix-Hallpike Right Symptoms No nystagmus               Vestibular Treatment/Exercise - 10/12/20 1257       Vestibular Treatment/Exercise   Vestibular Treatment Provided Canalith Repositioning;Gaze    Canalith Repositioning Epley Manuever Right    Gaze Exercises X1 Viewing Horizontal;X1 Viewing Vertical       EPLEY MANUEVER RIGHT   Number of Reps  1    Response Details  reviewed how to perform self manuever with pillow behind back; instructions given to pt and pt return demonstrated.      X1 Viewing Horizontal   Foot Position seated    Reps 2    Comments 45 seconds mild symptoms > 60 seconds mild symptoms  X1 Viewing Vertical   Foot Position seated    Reps 2    Comments 30 seconds > 60 seconds, mild symptoms                Balance Exercises - 10/13/20 0936       Balance Exercises: Standing   Standing Eyes Opened Narrow base of support (BOS);Head turns;Solid surface;Other reps (comment)    Standing Eyes Opened Limitations progressed to feet together; 10 reps head turns and nods, slowly, focusing on using balance reactions    Other Standing Exercises Standing next to a wall performed lateral weight shifts with feet together to tap wall with shoulder and return to midline with control x 10 reps each side                PT Education - 10/13/20 0934     Education Details updated HEP, resolution of BPPV    Person(s) Educated Patient    Methods Explanation;Demonstration;Handout    Comprehension Verbalized understanding;Returned demonstration              PT Short Term Goals - 10/13/20 0929       PT SHORT TERM GOAL #1   Title Pt will complete  vestibular assessment and will initiate vestibular HEP    Time 4    Period Weeks    Status Achieved    Target Date 10/09/20      PT SHORT TERM GOAL #2   Title Pt will complete 2 or 6 minute walk test to assess how exertion affects symptoms    Time 4    Period Weeks    Status Achieved    Target Date 10/09/20               PT Long Term Goals - 09/09/20 1659       PT LONG TERM GOAL #1   Title Pt will demonstrate independence with final HEP    Time 8    Period Weeks    Status New    Target Date 11/08/20      PT LONG TERM GOAL #2   Title Pt will report 0-1/5 on all movements on MSQ to improve ability to perform daily activities    Time 8    Period Weeks    Status New    Target Date 11/08/20      PT LONG TERM GOAL #3   Title 2 or 6 minute walk test goal      PT LONG TERM GOAL #4   Title Pt will report ability to perform daily activities for >40 minutes before requiring rest break    Time 8    Period Weeks    Status New    Target Date 11/08/20      PT LONG TERM GOAL #5   Title Pt will report on FOTO improvement in DFS to >/= 54 and DPS to >/= 60    Baseline 49 and 55.6    Time 8    Period Weeks    Status New    Target Date 11/08/20                   Plan - 10/13/20 0929     Clinical Impression Statement Pt is progressing seated endurance at home and is independent with self monitoring HR and symptoms.  Pt continues to demonstrate full resolution of BPPV; pt also demonstrating improved gaze adaptation and was able to increase duration of VOR x1 viewing.  Continued to  address central vertigo and balance impairments with focus on use of balance reactions to decrease reliance on UE support.  Will continue to address and plan to finalize HEP next session in preparation for pt to travel [place] for work.  Pt has met all STG.    Personal Factors and Comorbidities Comorbidity 3+;Fitness;Past/Current Experience;Profession    Comorbidities h/o multiple chronic  cavernous malformations, R pontine lesion, Bipolar I disorder, OSA, seizures, anxiety, atrial arrhythmia, barrett esophagus, depression, HTN    Examination-Activity Limitations Bend;Locomotion Level;Reach Overhead;Stairs    Examination-Participation Restrictions Cleaning;Laundry;Meal Prep;Occupation;Yard Work    Stability/Clinical Decision Making Unstable/Unpredictable    Rehab Potential Good    PT Frequency 1x / week    PT Duration 8 weeks    PT Treatment/Interventions ADLs/Self Care Home Management;Canalith Repostioning;Gait training;Stair training;Functional mobility training;Therapeutic activities;Therapeutic exercise;Balance training;Neuromuscular re-education;Patient/family education;Vestibular;Energy conservation    PT Next Visit Plan Last session before travel to [place].  Review home treatment for Epley.  Finalize HEP: progress VOR as able, habituation to walking with head turns, bending down to floor, body turns.  Exertional intolerance training: floor bike or Nustep - see grid - HR between 87-97 bpm.  Keep symptoms at 3/10.    Consulted and Agree with Plan of Care Patient             Patient will benefit from skilled therapeutic intervention in order to improve the following deficits and impairments:  Abnormal gait, Decreased activity tolerance, Decreased balance, Decreased coordination, Decreased endurance, Decreased mobility, Decreased strength, Difficulty walking, Dizziness, Impaired sensation, Impaired UE functional use  Visit Diagnosis: Dizziness and giddiness  Unsteadiness on feet  Other abnormalities of gait and mobility  BPPV (benign paroxysmal positional vertigo), right     Problem List Patient Active Problem List   Diagnosis Date Noted   Dyslipidemia 04/14/2020   Vitamin D insufficiency 05/13/2019   Vertigo of central origin 02/01/2018   GAD (generalized anxiety disorder) 12/06/2017   Bipolar I disorder, moderate, current or most recent episode  depressed, with anxious distress ([place]) 12/06/2017   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[place]) 05/17/2017   Insomnia 05/17/2017   OSA on CPAP 05/17/2017   Seizures ([place]) 09/28/2016   Cavernous hemangioma of brain ([place]) 12/30/2014   HTN (hypertension) 12/30/2014    Rico Junker, PT, DPT 10/13/20    9:39 AM   [facility] [address] [place], [place], [address] Phone: [phone]   Fax:  [phone]  Name: Todd Harris MRN: [id number] Date of Birth: 29-Jan-1960

## 2020-10-15 ENCOUNTER — Other Ambulatory Visit: Payer: Self-pay

## 2020-10-15 ENCOUNTER — Ambulatory Visit: Payer: BC Managed Care – PPO | Admitting: Physical Therapy

## 2020-10-15 DIAGNOSIS — R2681 Unsteadiness on feet: Secondary | ICD-10-CM

## 2020-10-15 DIAGNOSIS — H8111 Benign paroxysmal vertigo, right ear: Secondary | ICD-10-CM | POA: Diagnosis not present

## 2020-10-15 DIAGNOSIS — R2689 Other abnormalities of gait and mobility: Secondary | ICD-10-CM | POA: Diagnosis not present

## 2020-10-15 DIAGNOSIS — R42 Dizziness and giddiness: Secondary | ICD-10-CM

## 2020-10-15 NOTE — Therapy (Signed)
[facility] [address] [place], [place], [address] Phone: [phone]   Fax:  [phone]  Physical Therapy Treatment  Patient Details  Name: Todd Harris MRN: [id number] Date of Birth: 1960-09-18 Referring Provider (PT): Frann Rider, NP   Encounter Date: 10/15/2020   PT End of Session - 10/15/20 0800     Visit Number 6    Number of Visits 9    Date for PT Re-Evaluation 11/08/20    Authorization Type BCBS    PT Start Time 0800    PT Stop Time 0849    PT Time Calculation (min) 49 min    Activity Tolerance Patient tolerated treatment well    Behavior During Therapy [facility] for tasks assessed/performed             Past Medical History:  Diagnosis Date   Anxiety    Atrial arrhythmia    Barrett esophagus    Bipolar 1 disorder ([place])    Cavernous angioma    Depression    History of chicken pox    Hypertension    Seizures ([place])    1 in 2005, 1 in 2016    Sleep apnea    Sleep apnea    Stroke ([place]) 11/2014   No residual deficits   Venous malformation    Brain    Past Surgical History:  Procedure Laterality Date   APPENDECTOMY  1970   age 18   bone spur     age 10   kidney stones     RAD ONC MR BRAIN GAMMA KNIFE  12/05/2016   TONSILLECTOMY  [id number]   UMBILICAL HERNIA REPAIR N/A 07/24/2018   Procedure: HERNIA REPAIR UMBILICAL ADULT;  Surgeon: Stark Klein, MD;  Location: [facility];  Service: General;  Laterality: N/A;    There were no vitals filed for this visit.   Subjective Assessment - 10/15/20 0801     Subjective Is up to 1000 revolutions on floor bike in 15 minutes.  Still doing 2x/day.  Packing this weekend for trip.    Pertinent History multiple chronic cavernous malformations, R pontine lesion, Bipolar I disorder, OSA, seizures, anxiety, atrial arrhythmia, barrett esophagus, depression, HTN    Patient Stated Goals To help the dizziness; pt has to travel for work - going to [place] in October     Currently in Pain? No/denies               Vestibular Assessment - 10/15/20 0819       Positional Testing   Dix-Hallpike Dix-Hallpike Right      Dix-Hallpike Right   Dix-Hallpike Right Duration 2 seconds    Dix-Hallpike Right Symptoms Upbeat, right rotatory nystagmus                 Vestibular Treatment/Exercise - 10/15/20 0818       Vestibular Treatment/Exercise   Vestibular Treatment Provided Canalith Repositioning;Gaze    Canalith Repositioning Epley Manuever Right       EPLEY MANUEVER RIGHT   Number of Reps  1    Overall Response Improved Symptoms    Response Details  reviewed how to perform self CRM with pillow behind back      X1 Viewing Horizontal   Foot Position seated with back support; standing with UE support, feet wide, blank background    Comments 60 seconds; 2/10 in sitting.  3/10 in standing, required UE support for balance      X1 Viewing Vertical   Foot Position  seated with back support; standing with UE support, feet wide, blank background    Comments 60 seconds; 1/10 in sitting.  3.5 in standing, required UE support for balance                Balance Exercises - 10/15/20 0854       Balance Exercises: Standing   Wall Bumps Shoulder;Hip;Eyes opened;Eyes closed;5 reps    Gait with Head Turns Forward;Upper extremity support;2 reps   head turns to R and L, UE support on counter   Other Standing Exercises Reviewed standing weight shifting anterior/posterior with wall behind and chair in front for support using ankle strategy x 5 with EO and then 5 with EC.  Also reviewed lateral weight shifting and wall taps with EC x 5 reps each side.                PT Education - 10/15/20 0904     Education Details exertional/endurance training program and slow progression, final HEP, self-CRM, PCP recommendation for cardiology referral    Person(s) Educated Patient    Methods Explanation;Demonstration;Handout    Comprehension Verbalized  understanding;Returned demonstration              PT Short Term Goals - 10/13/20 0929       PT SHORT TERM GOAL #1   Title Pt will complete vestibular assessment and will initiate vestibular HEP    Time 4    Period Weeks    Status Achieved    Target Date 10/09/20      PT SHORT TERM GOAL #2   Title Pt will complete 2 or 6 minute walk test to assess how exertion affects symptoms    Time 4    Period Weeks    Status Achieved    Target Date 10/09/20               PT Long Term Goals - 09/09/20 1659       PT LONG TERM GOAL #1   Title Pt will demonstrate independence with final HEP    Time 8    Period Weeks    Status New    Target Date 11/08/20      PT LONG TERM GOAL #2   Title Pt will report 0-1/5 on all movements on MSQ to improve ability to perform daily activities    Time 8    Period Weeks    Status New    Target Date 11/08/20      PT LONG TERM GOAL #3   Title 2 or 6 minute walk test goal      PT LONG TERM GOAL #4   Title Pt will report ability to perform daily activities for >40 minutes before requiring rest break    Time 8    Period Weeks    Status New    Target Date 11/08/20      PT LONG TERM GOAL #5   Title Pt will report on FOTO improvement in DFS to >/= 54 and DPS to >/= 60    Baseline 49 and 55.6    Time 8    Period Weeks    Status New    Target Date 11/08/20                   Plan - 10/15/20 0858     Clinical Impression Statement Discussed recommendations with patient from PCP regarding cardiology referral; pt reports he will discuss with PCP [place].  Completed final review of HEP and exercise recommendations for endurance training, vestibular and balance training.  Pt return demonstrated each exercise; discussed safe set up when at hotel.  Pt to return for follow up when he returns from [place].    Personal Factors and Comorbidities Comorbidity 3+;Fitness;Past/Current Experience;Profession    Comorbidities  h/o multiple chronic cavernous malformations, R pontine lesion, Bipolar I disorder, OSA, seizures, anxiety, atrial arrhythmia, barrett esophagus, depression, HTN    Examination-Activity Limitations Bend;Locomotion Level;Reach Overhead;Stairs    Examination-Participation Restrictions Cleaning;Laundry;Meal Prep;Occupation;Yard Work    Stability/Clinical Decision Making Unstable/Unpredictable    Rehab Potential Good    PT Frequency 1x / week    PT Duration 8 weeks    PT Treatment/Interventions ADLs/Self Care Home Management;Canalith Repostioning;Gait training;Stair training;Functional mobility training;Therapeutic activities;Therapeutic exercise;Balance training;Neuromuscular re-education;Patient/family education;Vestibular;Energy conservation    PT Next Visit Plan How was trip to [place]? Assess LTG and determine if pt needs more visits.  Follow up with PCP about cardiology.  Exertional intolerance training: floor bike or Nustep - see grid - HR between 87-97 bpm.  Keep symptoms at 3/10.    Consulted and Agree with Plan of Care Patient             Patient will benefit from skilled therapeutic intervention in order to improve the following deficits and impairments:  Abnormal gait, Decreased activity tolerance, Decreased balance, Decreased coordination, Decreased endurance, Decreased mobility, Decreased strength, Difficulty walking, Dizziness, Impaired sensation, Impaired UE functional use  Visit Diagnosis: Dizziness and giddiness  Unsteadiness on feet  Other abnormalities of gait and mobility  BPPV (benign paroxysmal positional vertigo), right     Problem List Patient Active Problem List   Diagnosis Date Noted   Dyslipidemia 04/14/2020   Vitamin D insufficiency 05/13/2019   Vertigo of central origin 02/01/2018   GAD (generalized anxiety disorder) 12/06/2017   Bipolar I disorder, moderate, current or most recent episode depressed, with anxious distress ([place]) 12/06/2017   Pontine  hemorrhage ([place]) 05/17/2017   Insomnia 05/17/2017   OSA on CPAP 05/17/2017   Seizures ([place]) 09/28/2016   Cavernous hemangioma of brain ([place]) 12/30/2014   HTN (hypertension) 12/30/2014    Rico Junker, PT, DPT 10/15/20    9:05 AM    [facility] [address] [place], [place], [address] Phone: [phone]   Fax:  [phone]  Name: KAILAND SEDA MRN: [id number] Date of Birth: [birth year]

## 2020-10-15 NOTE — Patient Instructions (Addendum)
1)  This exercises is AS NEEDED if you are feeling spinning when you lie down or roll to your side.     Exertion Training Protocol - Adapted from the [facility] Protocol Monday Floor bike or seated bike/stepper  5 min warm up, light resistance/low speed 1 min moderate resistance/increase speed 2 min recovery, light resistance/low speed 1 min moderate resistance/increase speed 5 min cool down, light resistance/low speed Tuesday  Strength Train Wednesday  Floor bike or seated bike/stepper 5 min warm up, light resistance/low speed 1 min moderate resistance/increase speed 2 min recovery, light resistance/low speed 1 min moderate resistance/increase speed 5 min cool down, light resistance/low speed Thursday  Strength Train Friday Floor bike or seated bike/stepper  5 min warm up, light resistance/low speed 1 min moderate resistance/increase speed 2 min recovery, light resistance/low speed 1 min moderate resistance/increase speed 5 min cool down, light resistance/low speed  Floor bike or seated bike/stepper  5 min warm up, light resistance/low speed 1.5 min moderate resistance/increase speed 3.5 min recovery, light resistance/low speed 1.5 min moderate resistance/increase speed 5 min cool down, light resistance/low speed  Strength Train Floor bike or seated bike/stepper  5 min warm up, light resistance/low speed 1.5 min moderate resistance/increase speed 3.5 min recovery, light resistance/low speed 1.5 min moderate resistance/increase speed 5 min cool down, light resistance/low speed  Strength Train Floor bike or seated bike/stepper  5 min warm up, light resistance/low speed 1.5 min moderate resistance/increase speed 3.5 min recovery, light resistance/low speed 1.5 min moderate resistance/increase speed 5 min cool down, light resistance/low speed  Floor bike or seated bike/stepper  5 min warm up, light resistance/low speed 2 min moderate resistance/increase speed 3  min recovery, light resistance/low speed 2 min moderate resistance/increase speed 5 min cool down, light resistance/low speed  Strength Train Floor bike or seated bike/stepper  5 min warm up, light resistance/low speed 2 min moderate resistance/increase speed 3 min recovery, light resistance/low speed 2 min moderate resistance/increase speed 5 min cool down, light resistance/low speed  Strength Train Floor bike or seated bike/stepper  5 min warm up, light resistance/low speed 2 min moderate resistance/increase speed 3 min recovery, light resistance/low speed 2 min moderate resistance/increase speed 5 min cool down, light resistance/low speed  Floor bike or seated bike/stepper  5 min warm up, light resistance/low speed 2.5 min moderate resistance/increase speed 3.5 min recovery, light resistance/low speed 2.5 min moderate resistance/increase speed 5 min cool down, light resistance/low speed  Strength Train Floor bike or seated bike/stepper  5 min warm up, light resistance/low speed 2.5 min moderate resistance/increase speed 3.5 min recovery, light resistance/low speed 2.5 min moderate resistance/increase speed 5 min cool down, light resistance/low speed  Strength Train Floor bike or seated bike/stepper  5 min warm up, light resistance/low speed 2.5 min moderate resistance/increase speed 3.5 min recovery, light resistance/low speed 2.5 min moderate resistance/increase speed 5 min cool down, light resistance/low speed   Keep Heart Rate between 87-97 bpm and tingling/dizziness symptoms at 3.5 or below  Access Code: [email or web address] URL: [email or web address] Date: 10/15/2020 Prepared by: Misty Stanley  Exercises Standing Narrow Base of Support - 1 x daily - 7 x weekly - 2 sets - 5 reps Side to Side Weight Shift with Unilateral Counter Support - 1 x daily - 7 x weekly - 2 sets - 5 reps Standing Anterior Posterior Weight Shift - 1 x daily - 7 x weekly - 2 sets - 5  reps Standing Gaze Stabilization with Head Rotation - 2 x daily -  7 x weekly - 1 sets - 60 second hold Standing Gaze Stabilization with Head Nod - 2 x daily - 7 x weekly - 1 sets - 60 second hold Walking Forward with Slow Head Rotation - 1 x daily - 7 x weekly - 4 sets

## 2020-10-22 ENCOUNTER — Encounter: Payer: BC Managed Care – PPO | Admitting: Physical Therapy

## 2020-10-26 ENCOUNTER — Encounter: Payer: BC Managed Care – PPO | Admitting: Physical Therapy

## 2020-11-04 ENCOUNTER — Other Ambulatory Visit: Payer: Self-pay | Admitting: Family Medicine

## 2020-11-19 ENCOUNTER — Ambulatory Visit: Payer: BC Managed Care – PPO | Attending: Adult Health | Admitting: Physical Therapy

## 2020-11-19 ENCOUNTER — Other Ambulatory Visit: Payer: Self-pay

## 2020-11-19 DIAGNOSIS — R278 Other lack of coordination: Secondary | ICD-10-CM | POA: Diagnosis not present

## 2020-11-19 DIAGNOSIS — R42 Dizziness and giddiness: Secondary | ICD-10-CM | POA: Diagnosis not present

## 2020-11-19 DIAGNOSIS — R2681 Unsteadiness on feet: Secondary | ICD-10-CM | POA: Diagnosis not present

## 2020-11-19 DIAGNOSIS — M6281 Muscle weakness (generalized): Secondary | ICD-10-CM | POA: Diagnosis not present

## 2020-11-19 DIAGNOSIS — R208 Other disturbances of skin sensation: Secondary | ICD-10-CM | POA: Insufficient documentation

## 2020-11-19 DIAGNOSIS — H8111 Benign paroxysmal vertigo, right ear: Secondary | ICD-10-CM | POA: Diagnosis not present

## 2020-11-19 DIAGNOSIS — R2689 Other abnormalities of gait and mobility: Secondary | ICD-10-CM | POA: Insufficient documentation

## 2020-11-19 NOTE — Patient Instructions (Addendum)
Access Code: [email or web address] URL: [email or web address] Date: 11/19/2020 Prepared by: Misty Stanley  Exercises Side to Side Weight Shift with Unilateral Counter Support - 1 x daily - 7 x weekly - 2 sets - 10 reps - added in weight shift with heel raise off floor to increase SLS Standing Gaze Stabilization with Head Rotation - 2 x daily - 7 x weekly - 1 sets - 45 second hold Standing Gaze Stabilization with Head Nod - 2 x daily - 7 x weekly - 1 sets - 45 second hold Staggered Stance Forward Backward Weight Shift with Unilateral Counter Support - 1 x daily - 7 x weekly - 2 sets - 10 reps - added in weight shift forwards with back foot heel lift for SLS Romberg Stance - 1 x daily - 7 x weekly - 2 sets - 5 reps head turns - UE support   1)  This exercises is AS NEEDED if you are feeling spinning when you lie down or roll to your side.    For floor bike exercise: Try to increase your time by 30 seconds every 4-5 days as long as HR stays between 90-100 bpm and tingling/dizziness symptoms at 3.5 or below.  During those 15 minutes try to perform 10-15 seconds of faster pedaling and then return to regular pace.

## 2020-11-19 NOTE — Therapy (Signed)
[facility] [address] [place], [place], [address] Phone: [phone]   Fax:  [phone]  Physical Therapy Treatment and D/C Summary  Patient Details  Name: Todd Harris MRN: [id number] Date of Birth: 1960-12-26 Referring Provider (PT): Frann Rider, NP   Encounter Date: 11/19/2020   PT End of Session - 11/19/20 1131     Visit Number 7    Number of Visits 9    Date for PT Re-Evaluation 11/08/20    Authorization Type BCBS    PT Start Time 0802    PT Stop Time 0850    PT Time Calculation (min) 48 min    Activity Tolerance Patient tolerated treatment well    Behavior During Therapy [facility] for tasks assessed/performed             Past Medical History:  Diagnosis Date   Anxiety    Atrial arrhythmia    Barrett esophagus    Bipolar 1 disorder ([place])    Cavernous angioma    Depression    History of chicken pox    Hypertension    Seizures ([place])    1 in 2005, 1 in 2016    Sleep apnea    Sleep apnea    Stroke ([place]) 11/2014   No residual deficits   Venous malformation    Brain    Past Surgical History:  Procedure Laterality Date   APPENDECTOMY  1970   age 82   bone spur     age 62   kidney stones     RAD ONC MR BRAIN GAMMA KNIFE  12/05/2016   TONSILLECTOMY  3254   UMBILICAL HERNIA REPAIR N/A 07/24/2018   Procedure: HERNIA REPAIR UMBILICAL ADULT;  Surgeon: Stark Klein, MD;  Location: [facility];  Service: General;  Laterality: N/A;    There were no vitals filed for this visit.   Subjective Assessment - 11/19/20 0806     Subjective Had an interesting trip; still having issues with dizziness with exertion but resting HR has decreased with his aerobic training.  Did not notice any vertigo with bed mobility.  Is doing his seated cycle 15 minutes, 2x/day, HR increases to 110, used to be 120.    Pertinent History multiple chronic cavernous malformations, R pontine lesion, Bipolar I disorder, OSA, seizures,  anxiety, atrial arrhythmia, barrett esophagus, depression, HTN    Patient Stated Goals To help the dizziness; pt has to travel for work - going to [place] in October                [facility] PT Assessment - 11/19/20 1139       Observation/Other Assessments   Focus on Therapeutic Outcomes (FOTO)  DFS: 50; DPS: 48.2                 Vestibular Assessment - 11/19/20 0819       Positional Sensitivities   Sit to Supine Lightheadedness    Supine to Left Side No dizziness    Supine to Right Side No dizziness    Supine to Sitting Lightheadedness    Nose to Right Knee Moderate dizziness    Right Knee to Sitting Moderate dizziness    Nose to Left Knee Moderate dizziness    Left Knee to Sitting Moderate dizziness    Head Turning x 5 Lightheadedness    Head Nodding x 5 Mild dizziness   able to complete 5 repetitions   Pivot Right in Standing No dizziness  Pivot Left in Standing No dizziness    Rolling Right No dizziness    Rolling Left No dizziness             Reviewed final HEP exercises below and aerobic progression of floor bike:  Access Code: [email or web address] URL: [email or web address] Date: 11/19/2020 Prepared by: Misty Stanley  Exercises Side to Side Weight Shift with Unilateral Counter Support - 1 x daily - 7 x weekly - 2 sets - 10 reps - added in weight shift with heel raise off floor to increase SLS Standing Gaze Stabilization with Head Rotation - 2 x daily - 7 x weekly - 1 sets - 45 second hold Standing Gaze Stabilization with Head Nod - 2 x daily - 7 x weekly - 1 sets - 45 second hold Staggered Stance Forward Backward Weight Shift with Unilateral Counter Support - 1 x daily - 7 x weekly - 2 sets - 10 reps - added in weight shift forwards with back foot heel lift for SLS Romberg Stance - 1 x daily - 7 x weekly - 2 sets - 5 reps head turns - UE support   1)  This exercises is AS NEEDED if you are feeling spinning when you lie down or roll to your  side.    For floor bike exercise: Try to increase your time by 30 seconds every 4-5 days as long as HR stays between 90-100 bpm and tingling/dizziness symptoms at 3.5 or below.  During those 15 minutes try to perform 10-15 seconds of faster pedaling and then return to regular pace.       PT Education - 11/19/20 1128     Education Details progress towards goals, final HEP with ways to progress; recommendation to follow up with PCP about cardiology referral, D/C today    Person(s) Educated Patient    Methods Explanation;Demonstration;Handout    Comprehension Verbalized understanding;Returned demonstration              PT Short Term Goals - 10/13/20 0929       PT SHORT TERM GOAL #1   Title Pt will complete vestibular assessment and will initiate vestibular HEP    Time 4    Period Weeks    Status Achieved    Target Date 10/09/20      PT SHORT TERM GOAL #2   Title Pt will complete 2 or 6 minute walk test to assess how exertion affects symptoms    Time 4    Period Weeks    Status Achieved    Target Date 10/09/20               PT Long Term Goals - 11/19/20 0817       PT LONG TERM GOAL #1   Title Pt will demonstrate independence with final HEP    Time 8    Period Weeks    Status Achieved      PT LONG TERM GOAL #2   Title Pt will report 0-1/5 on all movements on MSQ to improve ability to perform daily activities    Baseline still reporting mild-mod with vertical head movements and bending down to the ground.  Bed mobility, pivoting all improved to 0-1 overall    Time 8    Period Weeks    Status Partially Met      PT LONG TERM GOAL #3   Title 2 or 6 minute walk test goal    Status Deferred      PT LONG TERM GOAL #  4   Title Pt will report ability to perform daily activities for >40 minutes before requiring rest break    Baseline Pt able to do yard work for 30 minutes before needing to take a break.    Time 8    Period Weeks    Status Partially Met      PT  LONG TERM GOAL #5   Title Pt will report on FOTO improvement in DFS to >/= 54 and DPS to >/= 60    Baseline 49 and 55.6 > 50 and 478.2 - Pt presents with central vertigo so scores not as reflective of patient progress    Time 8    Period Weeks    Status Not Met                   Plan - 11/19/20 1133     Clinical Impression Statement Pt has made steady progress and has met 1/5 LTG, partially meeting 2 LTG; did not meet 1 goal and 1 goal deferred.  Pt does demonstrate full resolution of peripheral vertigo/R BPPV.  Pt also demonstrates improvement in overall motion sensitivity with improved tolerance to vertical head movements and ability to perform more functional activities like unloading dishwasher (bending to floor) with decreased symptoms.  Pt did not meet FOTO goal but pt's main dizziness appears to be central and related to HR and exertion.  Pt has been making progress with seated aerobic exercise and demonstrates improved resting HR and ability to perform activities for longer period of time before onset of symptoms.  Due to ongoing symptoms related to elevated HR, PT recommending pt follow up with PCP for possible cardiology referral.  Pt is independent with aerobic and vestibular/balance HEP and will continue to progress on his own based on therapist's recommendations.  Pt is safe and clear for D/C today.  Discussed indications for returning to therapy.    Personal Factors and Comorbidities Comorbidity 3+;Fitness;Past/Current Experience;Profession    Comorbidities h/o multiple chronic cavernous malformations, R pontine lesion, Bipolar I disorder, OSA, seizures, anxiety, atrial arrhythmia, barrett esophagus, depression, HTN    Examination-Activity Limitations Bend;Locomotion Level;Reach Overhead;Stairs    Examination-Participation Restrictions Cleaning;Laundry;Meal Prep;Occupation;Yard Work    Stability/Clinical Decision Making Unstable/Unpredictable    Rehab Potential Good    PT  Frequency 1x / week    PT Duration 8 weeks    PT Treatment/Interventions ADLs/Self Care Home Management;Canalith Repostioning;Gait training;Stair training;Functional mobility training;Therapeutic activities;Therapeutic exercise;Balance training;Neuromuscular re-education;Patient/family education;Vestibular;Energy conservation    PT Next Visit Plan D/C    Consulted and Agree with Plan of Care Patient             Patient will benefit from skilled therapeutic intervention in order to improve the following deficits and impairments:  Abnormal gait, Decreased activity tolerance, Decreased balance, Decreased coordination, Decreased endurance, Decreased mobility, Decreased strength, Difficulty walking, Dizziness, Impaired sensation, Impaired UE functional use  Visit Diagnosis: Dizziness and giddiness  Unsteadiness on feet  Other abnormalities of gait and mobility  BPPV (benign paroxysmal positional vertigo), right  Other lack of coordination  Other disturbances of skin sensation  Muscle weakness (generalized)     Problem List Patient Active Problem List   Diagnosis Date Noted   Dyslipidemia 04/14/2020   Vitamin D insufficiency 05/13/2019   Vertigo of central origin 02/01/2018   GAD (generalized anxiety disorder) 12/06/2017   Bipolar I disorder, moderate, current or most recent episode depressed, with anxious distress ([place]) 12/06/2017   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 Patient goals  were partially met. Patient is being discharged due to being pleased with the current functional level.  Rico Junker, PT, DPT 11/19/20    11:49 AM   [facility] [address] [place], [place], [address] Phone: [phone]   Fax:  [phone]  Name: JALIK GELLATLY MRN: [id number] Date of Birth: 1960/11/06

## 2021-01-06 ENCOUNTER — Other Ambulatory Visit: Payer: Self-pay | Admitting: Family Medicine

## 2021-01-06 DIAGNOSIS — E785 Hyperlipidemia, unspecified: Secondary | ICD-10-CM

## 2021-01-13 ENCOUNTER — Other Ambulatory Visit: Payer: Self-pay | Admitting: Family Medicine

## 2021-02-05 ENCOUNTER — Other Ambulatory Visit: Payer: Self-pay | Admitting: Family Medicine

## 2021-02-05 DIAGNOSIS — I10 Essential (primary) hypertension: Secondary | ICD-10-CM

## 2021-02-08 ENCOUNTER — Other Ambulatory Visit: Payer: Self-pay | Admitting: Family Medicine

## 2021-02-08 DIAGNOSIS — I10 Essential (primary) hypertension: Secondary | ICD-10-CM

## 2021-03-02 ENCOUNTER — Encounter: Payer: Self-pay | Admitting: Adult Health

## 2021-03-07 ENCOUNTER — Encounter: Payer: Self-pay | Admitting: Adult Health

## 2021-03-07 ENCOUNTER — Ambulatory Visit (INDEPENDENT_AMBULATORY_CARE_PROVIDER_SITE_OTHER): Payer: BC Managed Care – PPO | Admitting: Adult Health

## 2021-03-07 VITALS — BP 121/79 | HR 65 | Ht 69.0 in | Wt 231.0 lb

## 2021-03-07 DIAGNOSIS — G4733 Obstructive sleep apnea (adult) (pediatric): Secondary | ICD-10-CM

## 2021-03-07 DIAGNOSIS — Z9989 Dependence on other enabling machines and devices: Secondary | ICD-10-CM | POA: Diagnosis not present

## 2021-03-07 DIAGNOSIS — D1802 Hemangioma of intracranial structures: Secondary | ICD-10-CM | POA: Diagnosis not present

## 2021-03-07 DIAGNOSIS — I613 Nontraumatic intracerebral hemorrhage in brain stem: Secondary | ICD-10-CM
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
## 2021-03-24 ENCOUNTER — Encounter: Payer: Self-pay | Admitting: Adult Health

## 2021-03-24 NOTE — Telephone Encounter (Signed)
Please update letter as requested. Can provide separate letter allowing priority boarding/early boarding due to his vertigo and allowing more time due to difficulty lifting carry on luggage in to overhead bins quickly.  ?

## 2021-03-28 ENCOUNTER — Encounter: Payer: Self-pay | Admitting: *Deleted

## 2021-03-28 NOTE — Telephone Encounter (Signed)
Signed and placed in outbox.  Thank you. ?

## 2021-04-06 ENCOUNTER — Ambulatory Visit (INDEPENDENT_AMBULATORY_CARE_PROVIDER_SITE_OTHER): Payer: BC Managed Care – PPO | Admitting: Behavioral Health

## 2021-04-06 ENCOUNTER — Encounter: Payer: Self-pay | Admitting: Behavioral Health

## 2021-04-06 DIAGNOSIS — F411 Generalized anxiety disorder: Secondary | ICD-10-CM

## 2021-04-06 DIAGNOSIS — G40209 Localization-related (focal) (partial) symptomatic epilepsy and epileptic syndromes with complex partial seizures, not intractable, without status epilepticus: Secondary | ICD-10-CM

## 2021-04-06 DIAGNOSIS — F319 Bipolar disorder, unspecified: Secondary | ICD-10-CM | POA: Diagnosis not present

## 2021-04-06 DIAGNOSIS — G4733 Obstructive sleep apnea (adult) (pediatric): Secondary | ICD-10-CM

## 2021-04-06 DIAGNOSIS — F3132 Bipolar disorder, current episode depressed, moderate: Secondary | ICD-10-CM

## 2021-04-06 MED ORDER — BUPROPION HCL ER (XL) 300 MG PO TB24: ORAL_TABLET | ORAL | 3 refills | Status: DC

## 2021-04-06 MED ORDER — DIAZEPAM 5 MG PO TABS: 5.0000 mg | ORAL_TABLET | Freq: Two times a day (BID) | ORAL | 3 refills | Status: DC

## 2021-04-06 MED ORDER — CARIPRAZINE HCL 3 MG PO CAPS: 3.0000 mg | ORAL_CAPSULE | Freq: Every day | ORAL | 3 refills | Status: DC

## 2021-04-06 MED ORDER — LAMOTRIGINE 100 MG PO TABS: 100.0000 mg | ORAL_TABLET | Freq: Two times a day (BID) | ORAL | 3 refills | Status: DC

## 2021-04-06 NOTE — Progress Notes (Signed)
[facility] ? ?Patient ID: Todd Harris,  ?MRN: [id number] ? ?PCP: Vivi Barrack, MD ? ?Date of Evaluation: 04/06/2021 ?Time spent:30 minutes ? ?Chief Complaint:  ?Chief Complaint   ?Anxiety; Depression; Follow-up; Medication Refill ?  ? ? ?HISTORY/CURRENT STATUS: ?HPI ?61 year old male presents to this office for follow up and medication management. Says his anxiety and depression have been well controlled. No changes since last visit. No mania or depressive episodes since last visit. No changes to medication regimen are indicate at this time. Roney Jaffe he had a pontine stroke approximately 3 year ago. Says that he also has been diagnosed with cavernous angioma brain. Says he is not concerned too much about his mental health right now but has some things with his physical health to work on. He is going to physical therapy due to activity intolerance due to cardiac issues. He reports anxiety today 2/10 and depression 1/10. Says he sleeps 7 hours per night. Reports no mania. No psychosis. No SI/HI. Continue every 6 months f/u per pt.  ?  ?Could not provide list of past medication trials this visit. ? ?Individual Medical History/ Review of Systems: Changes? :No  ? ?Allergies: Patient has no known allergies. ? ?Current Medications:  ?Current Outpatient Medications:  ?  atorvastatin (LIPITOR) 40 MG tablet, TAKE 1 TABLET BY MOUTH EVERY DAY, Disp: 90 tablet, Rfl: 1 ?  buPROPion (WELLBUTRIN XL) 300 MG 24 hr tablet, Take one tablet daily., Disp: 90 tablet, Rfl: 3 ?  cariprazine (VRAYLAR) 3 MG capsule, Take 1 capsule (3 mg total) by mouth daily., Disp: 90 capsule, Rfl: 3 ?  diazepam (VALIUM) 5 MG tablet, Take 1 tablet (5 mg total) by mouth 2 (two) times daily., Disp: 180 tablet, Rfl: 3 ?  icosapent Ethyl (VASCEPA) 1 g capsule, Take 2 capsules (2 g total) by mouth 2 (two) times daily., Disp: 360 capsule, Rfl: 3 ?  ipratropium (ATROVENT) 0.03 % nasal spray, INSTILL 2 SPRAYS INTO BOTH NOSTRILS EVERY 12 HOURS., Disp:  30 mL, Rfl: 1 ?  lamoTRIgine (LAMICTAL) 100 MG tablet, Take 1 tablet (100 mg total) by mouth 2 (two) times daily., Disp: 180 tablet, Rfl: 3 ?  lisinopril-hydrochlorothiazide (ZESTORETIC) 20-25 MG tablet, TAKE 1 TABLET BY MOUTH EVERY DAY, Disp: 90 tablet, Rfl: 1 ?  meclizine (MEDI-MECLIZINE) 25 MG tablet, Take 1 tablet (25 mg total) by mouth 3 (three) times daily as needed for dizziness., Disp: 90 tablet, Rfl: 6 ?  metFORMIN (GLUCOPHAGE) 500 MG tablet, Take 1 tablet (500 mg total) by mouth daily., Disp: 90 tablet, Rfl: 3 ?  zaleplon (SONATA) 5 MG capsule, Take 1 capsule (5 mg total) by mouth at bedtime as needed. for sleep, Disp: 30 capsule, Rfl: 1 ?Medication Side Effects: none ? ?Family Medical/ Social History: Changes? No ? ?MENTAL HEALTH EXAM: ? ?There were no vitals taken for this visit.There is no height or weight on file to calculate BMI.  ?General Appearance: Casual, Neat, and Well Groomed  ?Eye Contact:  Good  ?Speech:  Clear and Coherent  ?Volume:  Normal  ?Mood:  NA  ?Affect:  Appropriate  ?Thought Process:  Coherent  ?Orientation:  Full (Time, Place, and Person)  ?Thought Content: Logical   ?Suicidal Thoughts:  No  ?Homicidal Thoughts:  No  ?Memory:  WNL  ?Judgement:  Good  ?Insight:  Good  ?Psychomotor Activity:  Normal  ?Concentration:  Concentration: Good  ?Recall:  Good  ?Fund of Knowledge: Good  ?Language: Good  ?Assets:  Desire for  Improvement  ?ADL's:  Intact  ?Cognition: WNL  ?Prognosis:  Good  ? ? ?DIAGNOSES:  ?  ICD-10-CM   ?1. GAD (generalized anxiety disorder)  F41.1 cariprazine (VRAYLAR) 3 MG capsule  ?  diazepam (VALIUM) 5 MG tablet  ?  buPROPion (WELLBUTRIN XL) 300 MG 24 hr tablet  ?  ?2. Bipolar I disorder (HCC)  F31.9 cariprazine (VRAYLAR) 3 MG capsule  ?  ?3. Obstructive sleep apnea  G47.33 diazepam (VALIUM) 5 MG tablet  ?  ?4. Partial symptomatic epilepsy with complex partial seizures, not intractable, without status epilepticus (HCC)  G40.209 lamoTRIgine (LAMICTAL) 100 MG tablet  ?   ?5. Bipolar I disorder, moderate, current or most recent episode depressed, with anxious distress ([place])  F31.32 buPROPion (WELLBUTRIN XL) 300 MG 24 hr tablet  ?  ? ? ?Receiving Psychotherapy: No  ? ? ?RECOMMENDATIONS:  ? ?Continue Vraylar to 3 mg daily ?Continue Wellbutrin 300 mg XL daily ?Continue Valium  5 mg tablet twice daily ?Continue Lamictal 100 mg twice daily ?Follow up in 6 months per patient to reassess.  ?To report any worsening symptoms  or side effects promptly ?Monitor any increase in dizziness and notify if worsens ?Greater than 50% of 20  min face to face time with patient was spent on counseling and coordination of care. We discussed his current stability with bipolar disorder. He has not experienced any manic or depressive episodes since last visit.  ?Discussed potential benefits, risk, and side effects of benzodiazepines to include potential risk of tolerance and dependence, as well as possible drowsiness.  Advised patient not to drive if experiencing drowsiness and to take lowest possible effective dose to minimize risk of dependence and tolerance.  6 months of refills provided. Patient will be traveling out of country extensively for job and request 6 month follow up.   ?PDMP reviewed. ? ? ? ? ?Elwanda [place], NP  ?

## 2021-04-13 ENCOUNTER — Encounter: Payer: Self-pay | Admitting: Physician Assistant

## 2021-04-13 ENCOUNTER — Telehealth (INDEPENDENT_AMBULATORY_CARE_PROVIDER_SITE_OTHER): Payer: BC Managed Care – PPO | Admitting: Physician Assistant

## 2021-04-13 ENCOUNTER — Encounter: Payer: Self-pay | Admitting: Family Medicine

## 2021-04-13 VITALS — Ht 69.0 in | Wt 231.0 lb

## 2021-04-13 DIAGNOSIS — U071 COVID-19: Secondary | ICD-10-CM

## 2021-04-13 MED ORDER — MOLNUPIRAVIR EUA 200MG CAPSULE: 4.0000 | ORAL_CAPSULE | Freq: Two times a day (BID) | ORAL | 0 refills | Status: AC

## 2021-04-13 NOTE — Progress Notes (Signed)
? ?  Virtual Visit via Video Note ? ?I connected with  Todd Harris  on 04/13/21 at 11:30 AM EDT by a video enabled telemedicine application and verified that I am speaking with the correct person using two identifiers. ? ?Location: ?Patient: home ?Provider: Therapist, music at [employer] ?Persons present: Patient and myself ?  ?I discussed the limitations of evaluation and management by telemedicine and the availability of in person appointments. The patient expressed understanding and agreed to proceed.  ? ?History of Present Illness: ? ?Chief complaint: COVID-19 positive via home-test this morning 04/13/21 ?Symptom onset: 04/13/21 ?Pertinent positives: Woke up around 2 am, SOB, aching joints, congestion ?Pertinent negatives: N/V/D, abd pain, CP ?Treatments tried: Tylenol, Mucinex, Advil ?Vaccine status: Fully vaccinated for COVID-19  ?Sick exposure: ? Exposure at church ? ?  ?Observations/Objective: ? ?Unable to check VS ? ?Gen: Awake, alert, no acute distress, lying in bed ?Resp: Breathing is even and non-labored ?Psych: calm/pleasant demeanor ?Neuro: Alert and Oriented x 3, + facial symmetry, speech is clear. ? ? ?Assessment and Plan: ? ?1. COVID-19 ?Diagnosis confirmed via home antigen test. We discussed current algorithm recommendations for prescribing outpatient antivirals.  Risks versus benefits discussed.  He is within 5 day window onset of symptoms, plan to start Molnupiravir, possible SE discussed. Advised self-isolation at home for the next 5 days and then masking around others for at least an additional 5 days.  Treat supportively at this time including sleeping prone, deep breathing exercises, pushing fluids, walking every few hours, vitamins C and D, and Tylenol or ibuprofen as needed.  The patient understands that COVID-19 illness can wax and wane.  Should the symptoms acutely worsen or patient starts to experience sudden shortness of breath, chest pain, severe weakness, the patient will  go straight to the emergency department.  Also advised home pulse oximetry monitoring and for any reading consistently under 92%, should also report to the emergency department.  The patient will continue to keep [place] updated. ? ? ?Follow Up Instructions: ? ?  ?I discussed the assessment and treatment plan with the patient. The patient was provided an opportunity to ask questions and all were answered. The patient agreed with the plan and demonstrated an understanding of the instructions. ?  ?The patient was advised to call back or seek an in-person evaluation if the symptoms worsen or if the condition fails to improve as anticipated. ? ?Dashiell Franchino M Oluwatobiloba Martin, PA-C ? ?

## 2021-04-13 NOTE — Telephone Encounter (Signed)
Spoke with patient, patient had virtual visit today  ?Stated resting at this time  ?

## 2021-04-15 ENCOUNTER — Other Ambulatory Visit: Payer: Self-pay | Admitting: Physician Assistant

## 2021-04-15 ENCOUNTER — Encounter: Payer: Self-pay | Admitting: Family Medicine

## 2021-04-15 MED ORDER — BUDESONIDE-FORMOTEROL FUMARATE 80-4.5 MCG/ACT IN AERO: 2.0000 | INHALATION_SPRAY | Freq: Two times a day (BID) | RESPIRATORY_TRACT | 0 refills | Status: DC

## 2021-04-22 ENCOUNTER — Encounter: Payer: Self-pay | Admitting: Physician Assistant

## 2021-05-15 ENCOUNTER — Other Ambulatory Visit: Payer: Self-pay | Admitting: Behavioral Health

## 2021-05-15 DIAGNOSIS — F411 Generalized anxiety disorder: Secondary | ICD-10-CM

## 2021-05-15 DIAGNOSIS — G4733 Obstructive sleep apnea (adult) (pediatric): Secondary | ICD-10-CM

## 2021-05-23 ENCOUNTER — Other Ambulatory Visit: Payer: Self-pay | Admitting: Behavioral Health

## 2021-05-23 DIAGNOSIS — F411 Generalized anxiety disorder: Secondary | ICD-10-CM

## 2021-05-23 DIAGNOSIS — G4733 Obstructive sleep apnea (adult) (pediatric): Secondary | ICD-10-CM

## 2021-06-07 ENCOUNTER — Ambulatory Visit (INDEPENDENT_AMBULATORY_CARE_PROVIDER_SITE_OTHER): Payer: BC Managed Care – PPO | Admitting: Family Medicine

## 2021-06-07 ENCOUNTER — Other Ambulatory Visit (INDEPENDENT_AMBULATORY_CARE_PROVIDER_SITE_OTHER): Payer: BC Managed Care – PPO

## 2021-06-07 ENCOUNTER — Encounter: Payer: Self-pay | Admitting: Family Medicine

## 2021-06-07 VITALS — BP 127/80 | HR 65 | Temp 98.1°F | Ht 69.0 in | Wt 235.6 lb

## 2021-06-07 DIAGNOSIS — I10 Essential (primary) hypertension: Secondary | ICD-10-CM

## 2021-06-07 DIAGNOSIS — Z0001 Encounter for general adult medical examination with abnormal findings: Secondary | ICD-10-CM

## 2021-06-07 DIAGNOSIS — E1165 Type 2 diabetes mellitus with hyperglycemia: Secondary | ICD-10-CM | POA: Insufficient documentation

## 2021-06-07 DIAGNOSIS — F3132 Bipolar disorder, current episode depressed, moderate: Secondary | ICD-10-CM

## 2021-06-07 DIAGNOSIS — E559 Vitamin D deficiency, unspecified: Secondary | ICD-10-CM

## 2021-06-07 DIAGNOSIS — Z125 Encounter for screening for malignant neoplasm of prostate: Secondary | ICD-10-CM

## 2021-06-07 DIAGNOSIS — R42 Dizziness and giddiness: Secondary | ICD-10-CM

## 2021-06-07 DIAGNOSIS — R7303 Prediabetes: Secondary | ICD-10-CM | POA: Diagnosis not present

## 2021-06-07 DIAGNOSIS — E785 Hyperlipidemia, unspecified: Secondary | ICD-10-CM | POA: Diagnosis not present

## 2021-06-07 DIAGNOSIS — Z1159 Encounter for screening for other viral diseases: Secondary | ICD-10-CM | POA: Diagnosis not present

## 2021-06-07 DIAGNOSIS — F411 Generalized anxiety disorder: Secondary | ICD-10-CM

## 2021-06-07 DIAGNOSIS — G4700 Insomnia, unspecified: Secondary | ICD-10-CM

## 2021-06-07 LAB — LIPID PANEL
Cholesterol: 136 mg/dL (ref 0–200)
HDL: 42.1 mg/dL (ref >39.00)
LDL Cholesterol: 55 mg/dL (ref 0–99)
NonHDL: 93.6
Total CHOL/HDL Ratio: 3
Triglycerides: 192 mg/dL — ABNORMAL HIGH (ref 0.0–149.0)
VLDL: 38.4 mg/dL (ref 0.0–40.0)

## 2021-06-07 LAB — CBC
HCT: 44.8 % (ref 39.0–52.0)
Hemoglobin: 14.9 g/dL (ref 13.0–17.0)
MCHC: 33.4 g/dL (ref 30.0–36.0)
MCV: 91.3 fl (ref 78.0–100.0)
Platelets: 190 10*3/uL (ref 150.0–400.0)
RBC: 4.91 Mil/uL (ref 4.22–5.81)
RDW: 13.9 % (ref 11.5–15.5)
WBC: 8.8 10*3/uL (ref 4.0–10.5)

## 2021-06-07 LAB — COMPREHENSIVE METABOLIC PANEL
ALT: 27 U/L (ref 0–53)
AST: 15 U/L (ref 0–37)
Albumin: 4.8 g/dL (ref 3.5–5.2)
Alkaline Phosphatase: 60 U/L (ref 39–117)
BUN: 19 mg/dL (ref 6–23)
CO2: 28 mEq/L (ref 19–32)
Calcium: 10.5 mg/dL (ref 8.4–10.5)
Chloride: 100 mEq/L (ref 96–112)
Creatinine, Ser: 0.86 mg/dL (ref 0.40–1.50)
GFR: 92.96 mL/min (ref >60.00)
Glucose, Bld: 115 mg/dL — ABNORMAL HIGH (ref 70–99)
Potassium: 4.5 mEq/L (ref 3.5–5.1)
Sodium: 139 mEq/L (ref 135–145)
Total Bilirubin: 0.5 mg/dL (ref 0.2–1.2)
Total Protein: 6.9 g/dL (ref 6.0–8.3)

## 2021-06-07 LAB — PSA: PSA: 1.82 ng/mL (ref 0.10–4.00)

## 2021-06-07 LAB — TSH: TSH: 0.98 u[IU]/mL (ref 0.35–5.50)

## 2021-06-07 LAB — HEMOGLOBIN A1C: Hgb A1c MFr Bld: 6.3 % (ref 4.6–6.5)

## 2021-06-07 NOTE — Assessment & Plan Note (Signed)
Continue management per psychiatry. 

## 2021-06-07 NOTE — Progress Notes (Signed)
Chief Complaint:  Todd Harris is a 61 y.o. male who presents today for his annual comprehensive physical exam.    Assessment/Plan:  Chronic Problems Addressed Today: HTN (hypertension) Blood pressure is at goal on lisinopril-HCTZ 20-25 once daily.  We will check labs today.  Dyslipidemia He is on Crestor 40 mg daily and Vascepa 2 g twice daily.  Check lipids.  Vitamin D insufficiency Check vitamin D.  Vertigo No current symptoms though has symptoms even with minimal exertion.  He discussed with his neurologist who recommended further cardiac evaluation.  It has been several years since he has seen cardiology in the past.  It is somewhat reassuring that symptoms have been relatively stable in the last couple of years and we discussed this may be a sequela of his stroke however we also discuss that his symptoms could indicate a cardiac issue.  He is not having any obvious anginal symptoms however it is concerning that his symptoms only occur with exertion.  We discussed preliminary work-up including echocardiogram versus cardiology referral.  Patient like to go with cardiology referral.  We will place referral today.  GAD (generalized anxiety disorder) Continue management per psychiatry.  Bipolar I disorder, moderate, current or most recent episode depressed, with anxious distress ([place]) He is on Vraylar, Valium, and Lamictal per cardiology.  Insomnia On sonata per sleep medicine.  Prediabetes Last A1c 6.3.  He is on metformin 500 mg daily.  He may be interested in starting GLP-1 agonist such as Ozempic or Mounjaro.  We will recheck A1c today.  Would consider switching metformin to either one of these GLP-1 agonist depending on results of A1c and if his insurance will cover.   Preventative Healthcare: Due for colonoscopy later this year.  We will check labs.  Patient Counseling(The following topics were reviewed and/or handout was given):  -Nutrition: Stressed importance of  moderation in sodium/caffeine intake, saturated fat and cholesterol, caloric balance, sufficient intake of fresh fruits, vegetables, and fiber.  -Stressed the importance of regular exercise.   -Substance Abuse: Discussed cessation/primary prevention of tobacco, alcohol, or other drug use; driving or other dangerous activities under the influence; availability of treatment for abuse.   -Injury prevention: Discussed safety belts, safety helmets, smoke detector, smoking near bedding or upholstery.   -Sexuality: Discussed sexually transmitted diseases, partner selection, use of condoms, avoidance of unintended pregnancy and contraceptive alternatives.   -Dental health: Discussed importance of regular tooth brushing, flossing, and dental visits.  -Health maintenance and immunizations reviewed. Please refer to Health maintenance section.  Return to care in 1 year for next preventative visit.     Subjective:  HPI:  He has no acute complaints today.  See A/P for status of chronic conditions.  Patient has had difficulty with losing weight.  He has been working on his diet and try to cut down on sweets and exercises however he has been having issues with exertional vertigo.  This has been going on for last 5 years or so ever since he had a stroke.  He will feel a room spinning sensation with minimal exertion such as climbing a flight of stairs or walking long distances.  This significantly inhibits his ability to exercise.  He has discussed this with his neurologist in the past and they have recommended he get cardiac work-up for this.  Has not had any chest pain or shortness of breath with exertion.  Symptoms usually resolved within a few moments of stopping exertion.  Lifestyle Diet: None specific.  Exercise: Limited due to exertional vertigo.      06/07/2021   10:06 AM  Depression screen PHQ 2/9  Decreased Interest 0  Down, Depressed, Hopeless 0  PHQ - 2 Score 0    Health Maintenance Due   Topic Date Due   Hepatitis C Screening  Never done   Zoster Vaccines- Shingrix (2 of 2) 06/11/2020     ROS: Per HPI, otherwise a complete review of systems was negative.   PMH:  The following were reviewed and entered/updated in epic: Past Medical History:  Diagnosis Date   Anxiety    Atrial arrhythmia    Barrett esophagus    Bipolar 1 disorder ([place])    Cavernous angioma    Depression    History of chicken pox    Hypertension    Seizures ([place])    1 in 2005, 1 in 2016    Sleep apnea    Sleep apnea    Stroke ([place]) 11/2014   No residual deficits   Venous malformation    Brain   Patient Active Problem List   Diagnosis Date Noted   Prediabetes 06/07/2021   Dyslipidemia 04/14/2020   Vitamin D insufficiency 05/13/2019   Vertigo 02/01/2018   GAD (generalized anxiety disorder) 12/06/2017   Bipolar I disorder, moderate, current or most recent episode depressed, with anxious distress ([place]) 12/06/2017   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   Insomnia 05/17/2017   OSA on CPAP 05/17/2017   Seizures ([place]) 09/28/2016   Cavernous hemangioma of brain ([place]) 12/30/2014   HTN (hypertension) 12/30/2014   Past Surgical History:  Procedure Laterality Date   APPENDECTOMY  1970   age 52   bone spur     age 71   kidney stones     RAD ONC MR BRAIN GAMMA KNIFE  12/05/2016   TONSILLECTOMY  7619   UMBILICAL HERNIA REPAIR N/A 07/24/2018   Procedure: HERNIA REPAIR UMBILICAL ADULT;  Surgeon: Stark Klein, MD;  Location: [facility];  Service: General;  Laterality: N/A;    Family History  Problem Relation Age of Onset   Anxiety disorder Mother    Depression Mother    Bipolar disorder Mother    Asthma Mother    Diabetes Mother    Hypertension Mother    Pancreatic cancer Father    Diabetes Father    Cancer Father        Pancreatic   Diabetes Brother    Diabetes Brother    Bipolar disorder Maternal Grandmother    Aneurysm Maternal Grandmother    Diabetes Paternal Grandfather     Diabetes Paternal Grandmother    Colon cancer Neg Hx     Medications- reviewed and updated Current Outpatient Medications  Medication Sig Dispense Refill   atorvastatin (LIPITOR) 40 MG tablet TAKE 1 TABLET BY MOUTH EVERY DAY 90 tablet 1   budesonide-formoterol (SYMBICORT) 80-4.5 MCG/ACT inhaler Inhale 2 puffs into the lungs 2 (two) times daily. 1 each 0   buPROPion (WELLBUTRIN XL) 300 MG 24 hr tablet Take one tablet daily. 90 tablet 3   cariprazine (VRAYLAR) 3 MG capsule Take 1 capsule (3 mg total) by mouth daily. 90 capsule 3   diazepam (VALIUM) 5 MG tablet Take 1 tablet (5 mg total) by mouth 2 (two) times daily. 180 tablet 3   icosapent Ethyl (VASCEPA) 1 g capsule Take 2 capsules (2 g total) by mouth 2 (two) times daily. 360 capsule 3   ipratropium (ATROVENT) 0.03 % nasal spray INSTILL 2 SPRAYS INTO BOTH  NOSTRILS EVERY 12 HOURS. 30 mL 1   lamoTRIgine (LAMICTAL) 100 MG tablet Take 1 tablet (100 mg total) by mouth 2 (two) times daily. 180 tablet 3   lisinopril-hydrochlorothiazide (ZESTORETIC) 20-25 MG tablet TAKE 1 TABLET BY MOUTH EVERY DAY 90 tablet 1   meclizine (MEDI-MECLIZINE) 25 MG tablet Take 1 tablet (25 mg total) by mouth 3 (three) times daily as needed for dizziness. 90 tablet 6   metFORMIN (GLUCOPHAGE) 500 MG tablet Take 1 tablet (500 mg total) by mouth daily. 90 tablet 3   zaleplon (SONATA) 5 MG capsule Take 1 capsule (5 mg total) by mouth at bedtime as needed. for sleep 30 capsule 1   No current facility-administered medications for this visit.    Allergies-reviewed and updated No Known Allergies  Social History   Socioeconomic History   Marital status: Married    Spouse name: [name]    Number of children: 3   Years of education: 16+   Highest education level: Not on file  Occupational History   Occupation: IT department - Engineer, production   Occupation: Health and safety inspector    Comment: Mudlogger  Tobacco Use   Smoking status: Never   Smokeless tobacco: Never  Vaping Use    Vaping Use: Never used  Substance and Sexual Activity   Alcohol use: Yes    Alcohol/week: 4.0 standard drinks    Types: 4 Standard drinks or equivalent per week    Comment: was 4-5 beers/week   Drug use: No   Sexual activity: Not Currently  Other Topics Concern   Not on file  Social History Narrative   Lives in [place].    Married. 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      Caffeine use: 2 cup coffee per day   Social Determinants of Health   Financial Resource Strain: Not on file  Food Insecurity: Not on file  Transportation Needs: Not on file  Physical Activity: Not on file  Stress: Not on file  Social Connections: Not on file        Objective:  Physical Exam: BP 127/80   Pulse 65   Temp 98.1 F (36.7 C) (Temporal)   Ht '5\' 9"'$  (1.753 m)   Wt 235 lb 9.6 oz (106.9 kg)   SpO2 97%   BMI 34.79 kg/m   Body mass index is 34.79 kg/m. Wt Readings from Last 3 Encounters:  06/07/21 235 lb 9.6 oz (106.9 kg)  04/13/21 231 lb 0.7 oz (104.8 kg)  03/07/21 231 lb (104.8 kg)   Gen: NAD, resting comfortably HEENT: TMs normal bilaterally. OP clear. No thyromegaly noted.  CV: RRR with no murmurs appreciated Pulm: NWOB, CTAB with no crackles, wheezes, or rhonchi GI: Normal bowel sounds present. Soft, Nontender, Nondistended. MSK: no edema, cyanosis, or clubbing noted Skin: warm, dry Neuro: CN2-12 grossly intact. Strength 5/5 in upper and lower extremities. Reflexes symmetric and intact bilaterally.  Psych: Normal affect and thought content     Athena Baltz M. Jerline Pain, MD 06/07/2021 10:51 AM

## 2021-06-07 NOTE — Assessment & Plan Note (Addendum)
No current symptoms though has symptoms even with minimal exertion.  He discussed with his neurologist who recommended further cardiac evaluation.  It has been several years since he has seen cardiology in the past.  It is somewhat reassuring that symptoms have been relatively stable in the last couple of years and we discussed this may be a sequela of his stroke however we also discuss that his symptoms could indicate a cardiac issue.  He is not having any obvious anginal symptoms however it is concerning that his symptoms only occur with exertion.  We discussed preliminary work-up including echocardiogram versus cardiology referral.  Patient like to go with cardiology referral.  We will place referral today.

## 2021-06-07 NOTE — Assessment & Plan Note (Signed)
On sonata per sleep medicine.

## 2021-06-07 NOTE — Patient Instructions (Signed)
It was very nice to see you today!  We will check blood work today.  You probably do well with Ozempic or Mounjaro.  We will try to send 1 of these and depending on results of your blood work.  We will have you see a cardiologist to make sure that your heart is okay.  We will probably see you back in about 3 months after starting one of your medications.  I will see back in a year for your annual physical.  Please come back to see [place] sooner if needed.  Take care, Dr Jerline Pain  PLEASE NOTE:  If you had any lab tests please let us know if you have not heard back within a few days. You may see your results on mychart before we have a chance to review them but we will give you a call once they are reviewed by [place]. If we ordered any referrals today, please let us know if you have not heard from their office within the next week.   Please try these tips to maintain a healthy lifestyle:  Eat at least 3 REAL meals and 1-2 snacks per day.  Aim for no more than 5 hours between eating.  If you eat breakfast, please do so within one hour of getting up.   Each meal should contain half fruits/vegetables, one quarter protein, and one quarter carbs (no bigger than a computer mouse)  Cut down on sweet beverages. This includes juice, soda, and sweet tea.   Drink at least 1 glass of water with each meal and aim for at least 8 glasses per day  Exercise at least 150 minutes every week.    Preventive Care 54-13 Years Old, Male Preventive care refers to lifestyle choices and visits with your health care provider that can promote health and wellness. Preventive care visits are also called wellness exams. What can I expect for my preventive care visit? Counseling During your preventive care visit, your health care provider may ask about your: Medical history, including: Past medical problems. Family medical history. Current health, including: Emotional well-being. Home life and relationship  well-being. Sexual activity. Lifestyle, including: Alcohol, nicotine or tobacco, and drug use. Access to firearms. Diet, exercise, and sleep habits. Safety issues such as seatbelt and bike helmet use. Sunscreen use. Work and work Statistician. Physical exam Your health care provider will check your: Height and weight. These may be used to calculate your BMI (body mass index). BMI is a measurement that tells if you are at a healthy weight. Waist circumference. This measures the distance around your waistline. This measurement also tells if you are at a healthy weight and may help predict your risk of certain diseases, such as type 2 diabetes and high blood pressure. Heart rate and blood pressure. Body temperature. Skin for abnormal spots. What immunizations do I need?  Vaccines are usually given at various ages, according to a schedule. Your health care provider will recommend vaccines for you based on your age, medical history, and lifestyle or other factors, such as travel or where you work. What tests do I need? Screening Your health care provider may recommend screening tests for certain conditions. This may include: Lipid and cholesterol levels. Diabetes screening. This is done by checking your blood sugar (glucose) after you have not eaten for a while (fasting). Hepatitis B test. Hepatitis C test. HIV (human immunodeficiency virus) test. STI (sexually transmitted infection) testing, if you are at risk. Lung cancer screening. Prostate cancer screening. Colorectal cancer  screening. Talk with your health care provider about your test results, treatment options, and if necessary, the need for more tests. Follow these instructions at home: Eating and drinking  Eat a diet that includes fresh fruits and vegetables, whole grains, lean protein, and low-fat dairy products. Take vitamin and mineral supplements as recommended by your health care provider. Do not drink alcohol if your  health care provider tells you not to drink. If you drink alcohol: Limit how much you have to 0-2 drinks a day. Know how much alcohol is in your drink. In the [place] one drink equals one 12 oz bottle of beer (355 mL), one 5 oz glass of wine (148 mL), or one 1 oz glass of hard liquor (44 mL). Lifestyle Brush your teeth every morning and night with fluoride toothpaste. Floss one time each day. Exercise for at least 30 minutes 5 or more days each week. Do not use any products that contain nicotine or tobacco. These products include cigarettes, chewing tobacco, and vaping devices, such as e-cigarettes. If you need help quitting, ask your health care provider. Do not use drugs. If you are sexually active, practice safe sex. Use a condom or other form of protection to prevent STIs. Take aspirin only as told by your health care provider. Make sure that you understand how much to take and what form to take. Work with your health care provider to find out whether it is safe and beneficial for you to take aspirin daily. Find healthy ways to manage stress, such as: Meditation, yoga, or listening to music. Journaling. Talking to a trusted person. Spending time with friends and family. Minimize exposure to UV radiation to reduce your risk of skin cancer. Safety Always wear your seat belt while driving or riding in a vehicle. Do not drive: If you have been drinking alcohol. Do not ride with someone who has been drinking. When you are tired or distracted. While texting. If you have been using any mind-altering substances or drugs. Wear a helmet and other protective equipment during sports activities. If you have firearms in your house, make sure you follow all gun safety procedures. What's next? Go to your health care provider once a year for an annual wellness visit. Ask your health care provider how often you should have your eyes and teeth checked. Stay up to date on all vaccines. This information  is not intended to replace advice given to you by your health care provider. Make sure you discuss any questions you have with your health care provider. Document Revised: 06/16/2020 Document Reviewed: 06/16/2020 Elsevier Patient Education  [place].

## 2021-06-07 NOTE — Assessment & Plan Note (Signed)
He is on Vraylar, Valium, and Lamictal per cardiology.

## 2021-06-07 NOTE — Assessment & Plan Note (Signed)
Blood pressure is at goal on lisinopril-HCTZ 20-25 once daily.  We will check labs today.

## 2021-06-07 NOTE — Assessment & Plan Note (Signed)
Check vitamin D. 

## 2021-06-07 NOTE — Assessment & Plan Note (Signed)
He is on Crestor 40 mg daily and Vascepa 2 g twice daily.  Check lipids.

## 2021-06-07 NOTE — Assessment & Plan Note (Addendum)
Last A1c 6.3.  He is on metformin 500 mg daily.  He may be interested in starting GLP-1 agonist such as Ozempic or Mounjaro.  We will recheck A1c today.  Would consider switching metformin to either one of these GLP-1 agonist depending on results of A1c and if his insurance will cover.

## 2021-06-08 LAB — HEPATITIS C ANTIBODY
Hepatitis C Ab: NONREACTIVE
SIGNAL TO CUT-OFF: 0.11 (ref <1.00)

## 2021-06-10 ENCOUNTER — Encounter: Payer: BC Managed Care – PPO | Admitting: Family Medicine

## 2021-06-13 NOTE — Progress Notes (Signed)
Please inform patient of the following:  His blood sugar is still in the prediabetic range. We can try ozempic 0.'25mg'$  weekly for 4 weeks and then have him increase to 0.'5mg'$  weekly. We discussed this at his office visit. We should recheck in 3-6 months.  Everything else is STABLE and we can recheck in a year.  Algis Greenhouse. Jerline Pain, MD 06/13/2021 5:18 PM

## 2021-06-15 ENCOUNTER — Encounter: Payer: Self-pay | Admitting: Family Medicine

## 2021-06-15 ENCOUNTER — Other Ambulatory Visit: Payer: Self-pay | Admitting: *Deleted

## 2021-06-15 MED ORDER — OZEMPIC (0.25 OR 0.5 MG/DOSE) 2 MG/3ML ~~LOC~~ SOPN: 0.2500 mg | PEN_INJECTOR | SUBCUTANEOUS | 1 refills | Status: DC

## 2021-06-16 NOTE — Telephone Encounter (Signed)
See results note. 

## 2021-06-17 ENCOUNTER — Encounter: Payer: Self-pay | Admitting: Cardiovascular Disease

## 2021-06-17 ENCOUNTER — Ambulatory Visit (INDEPENDENT_AMBULATORY_CARE_PROVIDER_SITE_OTHER): Payer: BC Managed Care – PPO | Admitting: Cardiovascular Disease

## 2021-06-17 VITALS — BP 112/72 | HR 81 | Ht 69.0 in | Wt 234.6 lb

## 2021-06-17 DIAGNOSIS — R0609 Other forms of dyspnea: Secondary | ICD-10-CM

## 2021-06-17 NOTE — Patient Instructions (Signed)
Omron BP cuff V-8 juice every day   Breakfast suggestions-egg white omelette with grilled chicken,  Liquid VI with snack ([employer], [employer], [employer], [employer] ) or other electrolyte replacement, Nuun tabs. or V8 .   Medication Instructions:  Your physician recommends that you continue on your current medications as directed. Please refer to the Current Medication list given to you today.  *If you need a refill on your cardiac medications before your next appointment, please call your pharmacy*   Lab Work: NONE If you have labs (blood work) drawn today and your tests are completely normal, you will receive your results only by: [facility] (if you have MyChart) OR A paper copy in the mail If you have any lab test that is abnormal or we need to change your treatment, we will call you to review the results.   Testing/Procedures: GXT (exercise stress test) Your physician has requested that you have an exercise tolerance test. For further information please visit [email or web address]. Please also follow instruction sheet, as given.  ECHO Your physician has requested that you have an echocardiogram. Echocardiography is a painless test that uses sound waves to create images of your heart. It provides your doctor with information about the size and shape of your heart and how well your heart's chambers and valves are working. This procedure takes approximately one hour. There are no restrictions for this procedure.  Follow-Up: At [facility], you and your health needs are our priority.  As part of our continuing mission to provide you with exceptional heart care, we have created designated Provider Care Teams.  These Care Teams include your primary Cardiologist (physician) and Advanced Practice Providers (APPs -  Physician Assistants and Nurse Practitioners) who all work together to provide you with the care you need, when you need it.  Your next appointment:   6 week(s)  The format for your  next appointment:   In Person  Provider:   Ronn Melena or Nahser {  Other Instructions See instructions for stress test    Important Information About Sugar

## 2021-06-17 NOTE — Progress Notes (Signed)
Cardiology Office Note:    Date:  06/17/2021   ID:  Todd Harris, DOB Feb 27, 1960, MRN [id number]  PCP:  Vivi Barrack, MD   [facility] Providers Cardiologist:  Timmey Lamba  Click to update primary MD,subspecialty MD or APP then REFRESH:1}    Referring MD: Vivi Barrack, MD   Chief Complaint  Patient presents with   Dizziness    History of Present Illness:    Todd Harris is a 61 y.o. male with a hx of vertigo Pontine stroke 5 years ago If his HR increased, his vertigo will get very bad  [place],  his vertigo worsened and his left arm numbness worsened. Resolved when he stopped to rest   Swimmy headed with exercise  Does have orthostatic hypotension  Gets vertigo when looking up  Gets dizzy while using the "Harrie Jeans it" dog throwing toy.    Has an apple watch,  when his HR might get into the 90.  Has not check his BP with this   We discussed his water intake, food intake  I'm curious to know if he is volume depleted which explains his neuro symptoms and his slightly higher than normal HR   Breakfast - high fiber proten bar, coffee Snack - mixed nuts with almonds Lunch - sandwich, water   Dinner - meat and veggie, bread ,water  Snack - mixed nuts  or popcorn   Has vertigo when lying back and with sitting up.    Past Medical History:  Diagnosis Date   Anxiety    Atrial arrhythmia    Barrett esophagus    Bipolar 1 disorder ([place])    Cavernous angioma    Depression    History of chicken pox    Hypertension    Seizures ([place])    1 in 2005, 1 in 2016    Sleep apnea    Sleep apnea    Stroke ([place]) 11/2014   No residual deficits   Venous malformation    Brain    Past Surgical History:  Procedure Laterality Date   APPENDECTOMY  1970   age 31   bone spur     age 46   kidney stones     RAD ONC MR BRAIN GAMMA KNIFE  12/05/2016   TONSILLECTOMY  [id number]   UMBILICAL HERNIA REPAIR N/A 07/24/2018   Procedure: HERNIA REPAIR UMBILICAL  ADULT;  Surgeon: Stark Klein, MD;  Location: [facility];  Service: General;  Laterality: N/A;    Current Medications: Current Meds  Medication Sig   atorvastatin (LIPITOR) 40 MG tablet TAKE 1 TABLET BY MOUTH EVERY DAY   budesonide-formoterol (SYMBICORT) 80-4.5 MCG/ACT inhaler Inhale 2 puffs into the lungs 2 (two) times daily.   buPROPion (WELLBUTRIN XL) 300 MG 24 hr tablet Take one tablet daily.   cariprazine (VRAYLAR) 3 MG capsule Take 1 capsule (3 mg total) by mouth daily.   diazepam (VALIUM) 5 MG tablet Take 1 tablet (5 mg total) by mouth 2 (two) times daily.   icosapent Ethyl (VASCEPA) 1 g capsule Take 2 capsules (2 g total) by mouth 2 (two) times daily.   ipratropium (ATROVENT) 0.03 % nasal spray INSTILL 2 SPRAYS INTO BOTH NOSTRILS EVERY 12 HOURS.   lamoTRIgine (LAMICTAL) 100 MG tablet Take 1 tablet (100 mg total) by mouth 2 (two) times daily.   lisinopril-hydrochlorothiazide (ZESTORETIC) 20-25 MG tablet TAKE 1 TABLET BY MOUTH EVERY DAY   meclizine (MEDI-MECLIZINE) 25 MG tablet Take 1 tablet (  25 mg total) by mouth 3 (three) times daily as needed for dizziness.   metFORMIN (GLUCOPHAGE) 500 MG tablet Take 1 tablet (500 mg total) by mouth daily.   Semaglutide,0.25 or 0.'5MG'$ /DOS, (OZEMPIC, 0.25 OR 0.5 MG/DOSE,) 2 MG/3ML SOPN Inject 0.25 mg into the skin once a week. for 4 weeks and then  increase to 0.'5mg'$  weekly.   zaleplon (SONATA) 5 MG capsule Take 1 capsule (5 mg total) by mouth at bedtime as needed. for sleep     Allergies:   Patient has no known allergies.   Social History   Socioeconomic History   Marital status: Married    Spouse name: [name]    Number of children: 3   Years of education: 16+   Highest education level: Not on file  Occupational History   Occupation: IT department - Engineer, production   Occupation: Health and safety inspector    Comment: Mudlogger  Tobacco Use   Smoking status: Never   Smokeless tobacco: Never  Vaping Use   Vaping Use: Never used  Substance and Sexual  Activity   Alcohol use: Yes    Alcohol/week: 4.0 standard drinks of alcohol    Types: 4 Standard drinks or equivalent per week    Comment: was 4-5 beers/week   Drug use: No   Sexual activity: Not Currently  Other Topics Concern   Not on file  Social History Narrative   Lives in [place].    Married. 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      Caffeine use: 2 cup coffee per day   Social Determinants of Health   Financial Resource Strain: Not on file  Food Insecurity: Not on file  Transportation Needs: Not on file  Physical Activity: Not on file  Stress: Not on file  Social Connections: Not on file     Family History: The patient's family history includes Aneurysm in his maternal grandmother; Anxiety disorder in his mother; Asthma in his mother; Bipolar disorder in his maternal grandmother and mother; Cancer in his father; Depression in his mother; Diabetes in his brother, brother, father, mother, paternal grandfather, and paternal grandmother; Hypertension in his mother; Pancreatic cancer in his father. There is no history of Colon cancer.  ROS:   Please see the history of present illness.     All other systems reviewed and are negative.  EKGs/Labs/Other Studies Reviewed:    The following studies were reviewed today:   EKG:  EKG i June 17, 2021: Normal sinus rhythm at 81.  Voltage criteria for mild LVH  Recent Labs: 06/07/2021: ALT 27; BUN 19; Creatinine, Ser 0.86; Hemoglobin 14.9; Platelets 190.0; Potassium 4.5; Sodium 139; TSH 0.98  Recent Lipid Panel    Component Value Date/Time   CHOL 136 06/07/2021 1103   TRIG 192.0 (H) 06/07/2021 1103   HDL 42.10 06/07/2021 1103   CHOLHDL 3 06/07/2021 1103   VLDL 38.4 06/07/2021 1103   LDLCALC 55 06/07/2021 1103   LDLCALC 82 05/02/2019 1536   LDLDIRECT 58.0 07/04/2019 0902     Risk Assessment/Calculations:           Physical Exam:    VS:  BP  112/72   Pulse 81   Ht '5\' 9"'$  (1.753 m)   Wt 234 lb 9.6 oz (106.4 kg)   SpO2 95%   BMI 34.64 kg/m     Wt Readings from Last 3 Encounters:  06/17/21 234 lb  9.6 oz (106.4 kg)  06/07/21 235 lb 9.6 oz (106.9 kg)  04/13/21 231 lb 0.7 oz (104.8 kg)     GEN: Moderately obese, middle-aged gentleman, no acute distress HEENT: Normal NECK: No JVD; No carotid bruits LYMPHATICS: No lymphadenopathy CARDIAC: RRR, no murmurs, rubs, gallops RESPIRATORY:  Clear to auscultation without rales, wheezing or rhonchi  ABDOMEN: Soft, non-tender, non-distended MUSCULOSKELETAL:  No edema; No deformity  SKIN: Warm and dry NEUROLOGIC:  Alert and oriented x 3 PSYCHIATRIC:  Normal affect   ASSESSMENT:    1. DOE (dyspnea on exertion)    PLAN:    In order of problems listed above:  Exercise-induced vertigo: Todd Harris has vertigo symptoms whenever he exercises.  He has a history of a pontine stroke in the past.  Have never heard of exercise-induced vertigo.  I think it is more likely that he is volume depleted and when he exercises he becomes tachycardic and likely drops his blood pressure.  This may exacerbate his pontine stroke symptoms.  We will have him return for a exercise treadmill test.  We will monitor heart rate, blood pressure, O2 saturation through his exercise regimen.  If his blood pressure and heart rate respond normally doing the treadmill then I do not think that is heart is responsible for his neurologic symptoms.  I have asked him to increase his hydration including adding V8 juice in the morning.  He is to add electrolyte supplements to his water including liquid IV or Nuun tablets at least twice during the day.      Shared Decision Making/Informed Consent{   The risks [chest pain, shortness of breath, cardiac arrhythmias, dizziness, blood pressure fluctuations, myocardial infarction, stroke/transient ischemic attack, and life-threatening complications (estimated to be 1 in 10,000)], benefits  (risk stratification, diagnosing coronary artery disease, treatment guidance) and alternatives of an exercise tolerance test were discussed in detail with Todd Harris and he agrees to proceed.    Medication Adjustments/Labs and Tests Ordered: Current medicines are reviewed at length with the patient today.  Concerns regarding medicines are outlined above.  Orders Placed This Encounter  Procedures   Cardiac Stress Test: Informed Consent Details: Physician/Practitioner Attestation; Transcribe to consent form and obtain patient signature   EXERCISE TOLERANCE TEST (ETT)   EKG 12-Lead   ECHOCARDIOGRAM COMPLETE   No orders of the defined types were placed in this encounter.   Patient Instructions  Omron BP cuff V-8 juice every day   Breakfast suggestions-egg white omelette with grilled chicken,  Liquid VI with snack ([employer], [employer], [employer], [employer] ) or other electrolyte replacement, Nuun tabs. or V8 .   Medication Instructions:  Your physician recommends that you continue on your current medications as directed. Please refer to the Current Medication list given to you today.  *If you need a refill on your cardiac medications before your next appointment, please call your pharmacy*   Lab Work: NONE If you have labs (blood work) drawn today and your tests are completely normal, you will receive your results only by: [facility] (if you have MyChart) OR A paper copy in the mail If you have any lab test that is abnormal or we need to change your treatment, we will call you to review the results.   Testing/Procedures: GXT (exercise stress test) Your physician has requested that you have an exercise tolerance test. For further information please visit [email or web address]. Please also follow instruction sheet, as given.  ECHO Your physician has requested that you have an echocardiogram. Echocardiography is  a painless test that uses sound waves to create images of your heart. It provides  your doctor with information about the size and shape of your heart and how well your heart's chambers and valves are working. This procedure takes approximately one hour. There are no restrictions for this procedure.  Follow-Up: At [facility], you and your health needs are our priority.  As part of our continuing mission to provide you with exceptional heart care, we have created designated Provider Care Teams.  These Care Teams include your primary Cardiologist (physician) and Advanced Practice Providers (APPs -  Physician Assistants and Nurse Practitioners) who all work together to provide you with the care you need, when you need it.  Your next appointment:   6 week(s)  The format for your next appointment:   In Person  Provider:   Ronn Melena or Lajean Boese {  Other Instructions See instructions for stress test    Important Information About Sugar              Signed, Mertie Moores, MD  06/17/2021 3:42 PM    [place]

## 2021-06-18 NOTE — Progress Notes (Signed)
HE can stop metformin.  Algis Greenhouse. Jerline Pain, MD 06/18/2021 8:57 AM

## 2021-06-20 ENCOUNTER — Other Ambulatory Visit: Payer: Self-pay | Admitting: *Deleted

## 2021-06-21 DIAGNOSIS — L570 Actinic keratosis: Secondary | ICD-10-CM | POA: Diagnosis not present

## 2021-06-21 DIAGNOSIS — L820 Inflamed seborrheic keratosis: Secondary | ICD-10-CM | POA: Diagnosis not present

## 2021-06-21 DIAGNOSIS — D225 Melanocytic nevi of trunk: Secondary | ICD-10-CM | POA: Diagnosis not present

## 2021-06-21 DIAGNOSIS — D1801 Hemangioma of skin and subcutaneous tissue: Secondary | ICD-10-CM | POA: Diagnosis not present

## 2021-07-02 ENCOUNTER — Other Ambulatory Visit: Payer: Self-pay | Admitting: Family Medicine

## 2021-07-02 DIAGNOSIS — E785 Hyperlipidemia, unspecified: Secondary | ICD-10-CM

## 2021-07-13 ENCOUNTER — Encounter: Payer: Self-pay | Admitting: Gastroenterology

## 2021-07-14 ENCOUNTER — Ambulatory Visit (INDEPENDENT_AMBULATORY_CARE_PROVIDER_SITE_OTHER): Payer: BC Managed Care – PPO

## 2021-07-14 ENCOUNTER — Ambulatory Visit (HOSPITAL_COMMUNITY): Payer: BC Managed Care – PPO | Attending: Internal Medicine

## 2021-07-14 DIAGNOSIS — R0609 Other forms of dyspnea: Secondary | ICD-10-CM | POA: Diagnosis not present

## 2021-07-14 LAB — ECHOCARDIOGRAM COMPLETE
Area-P 1/2: 3.53 cm2
S' Lateral: 2.77 cm

## 2021-07-15 ENCOUNTER — Encounter: Payer: Self-pay | Admitting: Adult Health

## 2021-07-15 ENCOUNTER — Encounter: Payer: Self-pay | Admitting: Family Medicine

## 2021-07-15 ENCOUNTER — Encounter: Payer: Self-pay | Admitting: Cardiovascular Disease

## 2021-07-15 LAB — EXERCISE TOLERANCE TEST
Angina Index: 0
Duke Treadmill Score: 5
Estimated workload: 7
Exercise duration (min): 5 min
Exercise duration (sec): 4 s
MPHR: 158 {beats}/min
Peak HR: 137 {beats}/min
Percent HR: 86 %
RPE: 16
Rest HR: 69 {beats}/min
ST Depression (mm): 0 mm

## 2021-07-15 NOTE — Telephone Encounter (Signed)
I appreciate the update. Recommend he follow back up with his neurologist to discuss any further steps.  Algis Greenhouse. Jerline Pain, MD 07/15/2021 3:33 PM

## 2021-08-05 ENCOUNTER — Ambulatory Visit: Payer: BC Managed Care – PPO | Admitting: Cardiovascular Disease

## 2021-08-30 ENCOUNTER — Other Ambulatory Visit: Payer: Self-pay | Admitting: Family Medicine

## 2021-09-17 ENCOUNTER — Other Ambulatory Visit: Payer: Self-pay | Admitting: Family Medicine

## 2021-09-17 DIAGNOSIS — I10 Essential (primary) hypertension: Secondary | ICD-10-CM

## 2021-09-21 ENCOUNTER — Encounter: Payer: Self-pay | Admitting: Internal Medicine

## 2021-09-24 ENCOUNTER — Emergency Department (HOSPITAL_BASED_OUTPATIENT_CLINIC_OR_DEPARTMENT_OTHER): Payer: BC Managed Care – PPO | Admitting: Radiology

## 2021-09-24 ENCOUNTER — Other Ambulatory Visit: Payer: Self-pay

## 2021-09-24 ENCOUNTER — Emergency Department (HOSPITAL_BASED_OUTPATIENT_CLINIC_OR_DEPARTMENT_OTHER)
Admission: EM | Admit: 2021-09-24 | Discharge: 2021-09-25 | Disposition: A | Discharge status: Home / Self Care | Payer: BC Managed Care – PPO | Attending: Emergency Medicine | Admitting: Emergency Medicine

## 2021-09-24 DIAGNOSIS — Z79899 Other long term (current) drug therapy: Secondary | ICD-10-CM | POA: Diagnosis not present

## 2021-09-24 DIAGNOSIS — I471 Supraventricular tachycardia: Secondary | ICD-10-CM

## 2021-09-24 DIAGNOSIS — R002 Palpitations: Secondary | ICD-10-CM | POA: Diagnosis not present

## 2021-09-24 DIAGNOSIS — I10 Essential (primary) hypertension: Secondary | ICD-10-CM | POA: Insufficient documentation

## 2021-09-24 DIAGNOSIS — R000 Tachycardia, unspecified: Secondary | ICD-10-CM | POA: Diagnosis not present

## 2021-09-24 DIAGNOSIS — I480 Paroxysmal atrial fibrillation: Secondary | ICD-10-CM | POA: Insufficient documentation

## 2021-09-24 LAB — CBC
HCT: 44 % (ref 39.0–52.0)
Hemoglobin: 15.3 g/dL (ref 13.0–17.0)
MCH: 30.4 pg (ref 26.0–34.0)
MCHC: 34.8 g/dL (ref 30.0–36.0)
MCV: 87.5 fL (ref 80.0–100.0)
Platelets: 204 10*3/uL (ref 150–400)
RBC: 5.03 MIL/uL (ref 4.22–5.81)
RDW: 12.8 % (ref 11.5–15.5)
WBC: 11.2 10*3/uL — ABNORMAL HIGH (ref 4.0–10.5)
nRBC: 0 % (ref 0.0–0.2)

## 2021-09-24 LAB — BASIC METABOLIC PANEL
Anion gap: 11 (ref 5–15)
BUN: 31 mg/dL — ABNORMAL HIGH (ref 8–23)
CO2: 27 mmol/L (ref 22–32)
Calcium: 10.3 mg/dL (ref 8.9–10.3)
Chloride: 100 mmol/L (ref 98–111)
Creatinine, Ser: 1.15 mg/dL (ref 0.61–1.24)
GFR, Estimated: >60 mL/min (ref >60)
Glucose, Bld: 90 mg/dL (ref 70–99)
Potassium: 4.2 mmol/L (ref 3.5–5.1)
Sodium: 138 mmol/L (ref 135–145)

## 2021-09-24 NOTE — ED Triage Notes (Addendum)
Patient arrives with complaints of heart palpitations that started about 2 hours ago (now resolved). Patient states that he checked his SmartWatch and it stated that he was in the atrial fibrillation with a heart rate in the 140's.  Patient was seen by his cardiologist a few weeks and had a stress test that was clear.  Hx of a stroke with residual dizziness. Patient states that he did have an episode an of A. Fib once 40 years ago.

## 2021-09-25 ENCOUNTER — Emergency Department (HOSPITAL_BASED_OUTPATIENT_CLINIC_OR_DEPARTMENT_OTHER): Payer: BC Managed Care – PPO

## 2021-09-25 ENCOUNTER — Encounter: Payer: Self-pay | Admitting: Family Medicine

## 2021-09-25 ENCOUNTER — Encounter (HOSPITAL_BASED_OUTPATIENT_CLINIC_OR_DEPARTMENT_OTHER): Payer: Self-pay | Admitting: Radiology

## 2021-09-25 DIAGNOSIS — R079 Chest pain, unspecified: Secondary | ICD-10-CM | POA: Diagnosis not present

## 2021-09-25 DIAGNOSIS — R0602 Shortness of breath: Secondary | ICD-10-CM | POA: Diagnosis not present

## 2021-09-25 LAB — TROPONIN I (HIGH SENSITIVITY)
Troponin I (High Sensitivity): 3 ng/L (ref <18)
Troponin I (High Sensitivity): 3 ng/L (ref <18)

## 2021-09-25 MED ORDER — IOHEXOL 350 MG/ML SOLN
100.0000 mL | Freq: Once | INTRAVENOUS | Status: AC | PRN
  Administered 2021-09-25: 100 mL via INTRAVENOUS

## 2021-09-25 NOTE — ED Provider Notes (Signed)
[facility] Provider Note   CSN: [id number] Arrival date & time: 09/24/21  2131     History  Chief Complaint  Patient presents with   Palpitations    Todd Harris is a 61 y.o. male.  The history is provided by the patient.  Palpitations Palpitations quality:  Fast Onset quality:  Sudden Duration: 2.5 hours. Timing:  Constant Progression:  Resolved Chronicity:  New Context: not appetite suppressants, not blood loss, not caffeine, not exercise, not nicotine and not stimulant use   Relieved by:  Nothing Worsened by:  Nothing Ineffective treatments:  None tried Associated symptoms: no shortness of breath and no weakness   Risk factors: no diabetes mellitus and no hx of PE   Patient with HTN and stroke presents with palpitations at rest.  No DOE.  No SOB.  No nausea vomiting or diaphoresis.  No stimulants. No ETOH.      Past Medical History:  Diagnosis Date   Anxiety    Atrial arrhythmia    Barrett esophagus    Bipolar 1 disorder ([place])    Cavernous angioma    Depression    History of chicken pox    Hypertension    Seizures ([place])    1 in 2005, 1 in 2016    Sleep apnea    Stroke ([place]) 11/2014   No residual deficits   Venous malformation    Brain    Home Medications Prior to Admission medications   Medication Sig Start Date End Date Taking? Authorizing Provider  atorvastatin (LIPITOR) 40 MG tablet TAKE 1 TABLET BY MOUTH EVERY DAY 07/04/21   Vivi Barrack, MD  budesonide-formoterol [facility]) 80-4.5 MCG/ACT inhaler Inhale 2 puffs into the lungs 2 (two) times daily. 04/15/21   Allwardt, Randa Evens, PA-C  buPROPion (WELLBUTRIN XL) 300 MG 24 hr tablet Take one tablet daily. 04/06/21   Elwanda [place], NP  cariprazine (VRAYLAR) 3 MG capsule Take 1 capsule (3 mg total) by mouth daily. 04/06/21   Elwanda [place], NP  diazepam (VALIUM) 5 MG tablet Take 1 tablet (5 mg total) by mouth 2 (two) times daily. 04/06/21   Elwanda [place], NP  icosapent Ethyl  (VASCEPA) 1 g capsule Take 2 capsules (2 g total) by mouth 2 (two) times daily. 10/13/20   Vivi Barrack, MD  ipratropium (ATROVENT) 0.03 % nasal spray INSTILL 2 SPRAYS INTO BOTH NOSTRILS EVERY 12 HOURS. 01/13/21   Vivi Barrack, MD  lamoTRIgine (LAMICTAL) 100 MG tablet Take 1 tablet (100 mg total) by mouth 2 (two) times daily. 04/06/21   Elwanda [place], NP  lisinopril-hydrochlorothiazide (ZESTORETIC) 20-25 MG tablet TAKE 1 TABLET BY MOUTH EVERY DAY 09/19/21   Vivi Barrack, MD  meclizine (MEDI-MECLIZINE) 25 MG tablet Take 1 tablet (25 mg total) by mouth 3 (three) times daily as needed for dizziness. 08/11/20   Frann Rider, NP  metFORMIN (GLUCOPHAGE) 500 MG tablet Take 1 tablet (500 mg total) by mouth daily. 02/09/21   Vivi Barrack, MD  OZEMPIC, 0.25 OR 0.5 MG/DOSE, 2 MG/3ML SOPN INJECT 0.25 MG INTO THE SKIN ONCE A WEEK. FOR 4 WEEKS AND THEN INCREASE TO 0.'5MG'$  WEEKLY. 08/30/21   Vivi Barrack, MD  zaleplon (SONATA) 5 MG capsule Take 1 capsule (5 mg total) by mouth at bedtime as needed. for sleep 05/23/18   Dohmeier, Asencion Partridge, MD      Allergies    Patient has no known allergies.    Review of Systems  Review of Systems  Constitutional:  Negative for fever.  HENT:  Negative for facial swelling.   Respiratory:  Negative for shortness of breath, wheezing and stridor.   Cardiovascular:  Positive for palpitations. Negative for leg swelling.  Neurological:  Negative for weakness.  All other systems reviewed and are negative.   Physical Exam Updated Vital Signs BP 107/77   Pulse 75   Temp 97.6 F (36.4 C)   Resp 14   Ht '5\' 10"'$  (1.778 m)   Wt 91.6 kg   SpO2 94%   BMI 28.98 kg/m  Physical Exam Vitals and nursing note reviewed. Exam conducted with a chaperone present.  Constitutional:      General: He is not in acute distress.    Appearance: He is well-developed. He is not diaphoretic.  HENT:     Head: Normocephalic and atraumatic.     Nose: Nose normal.  Eyes:      Conjunctiva/sclera: Conjunctivae normal.     Pupils: Pupils are equal, round, and reactive to light.  Cardiovascular:     Rate and Rhythm: Normal rate and regular rhythm.     Pulses: Normal pulses.     Heart sounds: Normal heart sounds.  Pulmonary:     Effort: Pulmonary effort is normal.     Breath sounds: Normal breath sounds. No wheezing or rales.  Abdominal:     General: Bowel sounds are normal.     Palpations: Abdomen is soft.     Tenderness: There is no abdominal tenderness. There is no guarding or rebound.  Musculoskeletal:        General: Normal range of motion.     Cervical back: Normal range of motion and neck supple.  Skin:    General: Skin is warm and dry.  Neurological:     General: No focal deficit present.     Mental Status: He is alert and oriented to person, place, and time.     Deep Tendon Reflexes: Reflexes normal.  Psychiatric:        Mood and Affect: Mood normal.        Behavior: Behavior normal.     ED Results / Procedures / Treatments   Labs (all labs ordered are listed, but only abnormal results are displayed) Results for orders placed or performed during the hospital encounter of 26/71/24  Basic metabolic panel  Result Value Ref Range   Sodium 138 135 - 145 mmol/L   Potassium 4.2 3.5 - 5.1 mmol/L   Chloride 100 98 - 111 mmol/L   CO2 27 22 - 32 mmol/L   Glucose, Bld 90 70 - 99 mg/dL   BUN 31 (H) 8 - 23 mg/dL   Creatinine, Ser 1.15 0.61 - 1.24 mg/dL   Calcium 10.3 8.9 - 10.3 mg/dL   GFR, Estimated >60 >60 mL/min   Anion gap 11 5 - 15  CBC  Result Value Ref Range   WBC 11.2 (H) 4.0 - 10.5 K/uL   RBC 5.03 4.22 - 5.81 MIL/uL   Hemoglobin 15.3 13.0 - 17.0 g/dL   HCT 44.0 39.0 - 52.0 %   MCV 87.5 80.0 - 100.0 fL   MCH 30.4 26.0 - 34.0 pg   MCHC 34.8 30.0 - 36.0 g/dL   RDW 12.8 11.5 - 15.5 %   Platelets 204 150 - 400 K/uL   nRBC 0.0 0.0 - 0.2 %  Troponin I (High Sensitivity)  Result Value Ref Range   Troponin I (High Sensitivity) 3 <18 ng/L  Troponin I (High Sensitivity)  Result Value Ref Range   Troponin I (High Sensitivity) 3 <18 ng/L   CT Angio Chest PE W and/or Wo Contrast  Result Date: 09/25/2021 CLINICAL DATA:  Chest pain or SOB, pleurisy or effusion suspected. Atrial fibrillation. EXAM: CT ANGIOGRAPHY CHEST WITH CONTRAST TECHNIQUE: Multidetector CT imaging of the chest was performed using the standard protocol during bolus administration of intravenous contrast. Multiplanar CT image reconstructions and MIPs were obtained to evaluate the vascular anatomy. RADIATION DOSE REDUCTION: This exam was performed according to the departmental dose-optimization program which includes automated exposure control, adjustment of the mA and/or kV according to patient size and/or use of iterative reconstruction technique. CONTRAST:  16m OMNIPAQUE IOHEXOL 350 MG/ML SOLN COMPARISON:  10/10/2013 FINDINGS: Cardiovascular: Adequate opacification of the pulmonary arterial tree. No intraluminal filling defect identified to suggest acute pulmonary embolism. Central pulmonary arteries are of normal caliber. Extensive multi-vessel coronary artery calcification. Cardiac size within normal limits. No pericardial effusion. No significant atherosclerotic calcification within the thoracic aorta. No aortic aneurysm. Mediastinum/Nodes: No enlarged mediastinal, hilar, or axillary lymph nodes. Thyroid gland, trachea, and esophagus demonstrate no significant findings. Lungs/Pleura: Lungs are clear. No pleural effusion or pneumothorax. Upper Abdomen: No acute abnormality. Musculoskeletal: No acute bone abnormality. No lytic or blastic bone lesion. Review of the MIP images confirms the above findings. IMPRESSION: 1. No pulmonary embolism. 2. Extensive multi-vessel coronary artery calcification. Electronically Signed   By: AFidela SalisburyM.D.   On: 09/25/2021 00:48   DG Chest 2 View  Result Date: 09/24/2021 CLINICAL DATA:  Heart palpitations and tachycardia. EXAM: CHEST -  2 VIEW COMPARISON:  CT chest 10/10/2013. FINDINGS: The heart size and mediastinal contours are within normal limits. Both lungs are clear. The visualized skeletal structures are unremarkable apart from mild thoracic spondylosis. IMPRESSION: No acute radiographic chest findings. Electronically Signed   By: KTelford NabM.D.   On: 09/24/2021 22:15      EKG EKG Interpretation  Date/Time:  Saturday September 24 2021 21:43:46 EDT Ventricular Rate:  78 PR Interval:  166 QRS Duration: 90 QT Interval:  356 QTC Calculation: 405 R Axis:   -42 Text Interpretation: Normal sinus rhythm with sinus arrhythmia Left axis deviation Confirmed by PRandal Buba Kasidi Shanker ([id number]) on 09/24/2021 11:45:28 PM  Radiology CT Angio Chest PE W and/or Wo Contrast  Result Date: 09/25/2021 CLINICAL DATA:  Chest pain or SOB, pleurisy or effusion suspected. Atrial fibrillation. EXAM: CT ANGIOGRAPHY CHEST WITH CONTRAST TECHNIQUE: Multidetector CT imaging of the chest was performed using the standard protocol during bolus administration of intravenous contrast. Multiplanar CT image reconstructions and MIPs were obtained to evaluate the vascular anatomy. RADIATION DOSE REDUCTION: This exam was performed according to the departmental dose-optimization program which includes automated exposure control, adjustment of the mA and/or kV according to patient size and/or use of iterative reconstruction technique. CONTRAST:  1022mOMNIPAQUE IOHEXOL 350 MG/ML SOLN COMPARISON:  10/10/2013 FINDINGS: Cardiovascular: Adequate opacification of the pulmonary arterial tree. No intraluminal filling defect identified to suggest acute pulmonary embolism. Central pulmonary arteries are of normal caliber. Extensive multi-vessel coronary artery calcification. Cardiac size within normal limits. No pericardial effusion. No significant atherosclerotic calcification within the thoracic aorta. No aortic aneurysm. Mediastinum/Nodes: No enlarged mediastinal, hilar, or  axillary lymph nodes. Thyroid gland, trachea, and esophagus demonstrate no significant findings. Lungs/Pleura: Lungs are clear. No pleural effusion or pneumothorax. Upper Abdomen: No acute abnormality. Musculoskeletal: No acute bone abnormality. No lytic or blastic bone lesion. Review of the MIP images confirms the  above findings. IMPRESSION: 1. No pulmonary embolism. 2. Extensive multi-vessel coronary artery calcification. Electronically Signed   By: Fidela Salisbury M.D.   On: 09/25/2021 00:48   DG Chest 2 View  Result Date: 09/24/2021 CLINICAL DATA:  Heart palpitations and tachycardia. EXAM: CHEST - 2 VIEW COMPARISON:  CT chest 10/10/2013. FINDINGS: The heart size and mediastinal contours are within normal limits. Both lungs are clear. The visualized skeletal structures are unremarkable apart from mild thoracic spondylosis. IMPRESSION: No acute radiographic chest findings. Electronically Signed   By: Telford Nab M.D.   On: 09/24/2021 22:15    Procedures Procedures    Medications Ordered in ED Medications  iohexol (OMNIPAQUE) 350 MG/ML injection 100 mL (100 mLs Intravenous Contrast Given 09/25/21 0031)    ED Course/ Medical Decision Making/ A&P                           Medical Decision Making Patient with palpitation at rest.    Amount and/or Complexity of Data Reviewed Independent Historian: spouse    Details: See above  External Data Reviewed: notes.    Details: Previous notes reviewed  Labs: ordered.    Details: All labs reviewed 2 negative troponins 2.  White count slightly elevated 11.2, normal hemoglobin 15.3 normal platelet count.  Normal sodium 18, potassium normal 4.2 normal creatinine 1.15 Radiology: ordered and independent interpretation performed.    Details: CTA negative by me  ECG/medicine tests: ordered and independent interpretation performed. Decision-making details documented in ED Course.  Risk Prescription drug management. Risk Details: Ruled out for MI heart  score 2 low risk for MACe and PE in the ED.  Patient is instructed to follow up with his PMD and cardiologist for ongoing care.  Strict return precautions.      Final Clinical Impression(s) / ED Diagnoses Final diagnoses:  Paroxysmal supraventricular tachycardia ([place])   Return for intractable cough, coughing up blood, fevers > 100.4 unrelieved by medication, shortness of breath, intractable vomiting, chest pain, shortness of breath, weakness, numbness, changes in speech, facial asymmetry, abdominal pain, passing out, Inability to tolerate liquids or food, cough, altered mental status or any concerns. No signs of systemic illness or infection. The patient is nontoxic-appearing on exam and vital signs are within normal limits.  I have reviewed the triage vital signs and the nursing notes. Pertinent labs & imaging results that were available during my care of the patient were reviewed by me and considered in my medical decision making (see chart for details). After history, exam, and medical workup I feel the patient has been appropriately medically screened and is safe for discharge home. Pertinent diagnoses were discussed with the patient. Patient was given return precautions.  Rx / DC Orders ED Discharge Orders     None         Maekayla Giorgio, MD 09/25/21 0923

## 2021-09-26 ENCOUNTER — Encounter: Payer: Self-pay | Admitting: *Deleted

## 2021-09-26 NOTE — Telephone Encounter (Signed)
See note

## 2021-09-27 ENCOUNTER — Encounter: Payer: Self-pay | Admitting: Adult Health

## 2021-09-27 MED ORDER — PROPRANOLOL HCL 10 MG PO TABS: ORAL_TABLET | ORAL | 3 refills | Status: DC

## 2021-09-27 NOTE — Telephone Encounter (Signed)
Spoke with patient in detail about Valsava and diving maneuvers. He also does want the propranolol to try, so sending to [facility] on file at this time. Pt states he will try all of these things first, and if he is still having issues, he will call back to be seen next month.

## 2021-09-27 NOTE — Telephone Encounter (Signed)
Appreciate the update. He should contact his cardiologist and follow up with them soon.  Algis Greenhouse. Jerline Pain, MD 09/27/2021 7:24 AM

## 2021-10-06 ENCOUNTER — Ambulatory Visit: Payer: BC Managed Care – PPO | Admitting: Behavioral Health

## 2021-10-12 ENCOUNTER — Ambulatory Visit (INDEPENDENT_AMBULATORY_CARE_PROVIDER_SITE_OTHER): Payer: BC Managed Care – PPO | Admitting: Behavioral Health

## 2021-10-12 ENCOUNTER — Encounter: Payer: Self-pay | Admitting: Family Medicine

## 2021-10-12 ENCOUNTER — Encounter: Payer: Self-pay | Admitting: Behavioral Health

## 2021-10-12 DIAGNOSIS — F411 Generalized anxiety disorder: Secondary | ICD-10-CM | POA: Diagnosis not present

## 2021-10-12 DIAGNOSIS — F319 Bipolar disorder, unspecified: Secondary | ICD-10-CM

## 2021-10-12 NOTE — Progress Notes (Signed)
[facility] Check  Patient ID: Todd Harris,  MRN: [id number]  PCP: Vivi Barrack, MD  Date of Evaluation: 10/12/2021 Time spent:30 minutes  Chief Complaint:  Chief Complaint   Depression; Anxiety; Follow-up; Medication Refill; Patient Education     HISTORY/CURRENT STATUS: HPI  61 year old male presents to this office for follow up and medication management. Says his anxiety and depression have been well controlled. No changes since last visit. No mania or depressive episodes since last visit. No changes to medication regimen are indicate at this time. Says he is not concerned too much about his mental health right now but has some things with his physical health to work on. He says he has had problems with A-fib and is following up with Cardiologist regularly.  He reports anxiety today 2/10 and depression 1/10. Says he sleeps 7 hours per night. Reports no mania. No psychosis. No SI/HI.    Could not provide list of past medication trials this visit.  Individual Medical History/ Review of Systems: Changes? :No   Allergies: Patient has no known allergies.  Current Medications:  Current Outpatient Medications:    atorvastatin (LIPITOR) 40 MG tablet, TAKE 1 TABLET BY MOUTH EVERY DAY, Disp: 90 tablet, Rfl: 1   budesonide-formoterol (SYMBICORT) 80-4.5 MCG/ACT inhaler, Inhale 2 puffs into the lungs 2 (two) times daily., Disp: 1 each, Rfl: 0   buPROPion (WELLBUTRIN XL) 300 MG 24 hr tablet, Take one tablet daily., Disp: 90 tablet, Rfl: 3   cariprazine (VRAYLAR) 3 MG capsule, Take 1 capsule (3 mg total) by mouth daily., Disp: 90 capsule, Rfl: 3   diazepam (VALIUM) 5 MG tablet, Take 1 tablet (5 mg total) by mouth 2 (two) times daily., Disp: 180 tablet, Rfl: 3   icosapent Ethyl (VASCEPA) 1 g capsule, Take 2 capsules (2 g total) by mouth 2 (two) times daily., Disp: 360 capsule, Rfl: 3   ipratropium (ATROVENT) 0.03 % nasal spray, INSTILL 2 SPRAYS INTO BOTH NOSTRILS EVERY 12 HOURS.,  Disp: 30 mL, Rfl: 1   lamoTRIgine (LAMICTAL) 100 MG tablet, Take 1 tablet (100 mg total) by mouth 2 (two) times daily., Disp: 180 tablet, Rfl: 3   lisinopril-hydrochlorothiazide (ZESTORETIC) 20-25 MG tablet, TAKE 1 TABLET BY MOUTH EVERY DAY, Disp: 90 tablet, Rfl: 1   meclizine (MEDI-MECLIZINE) 25 MG tablet, Take 1 tablet (25 mg total) by mouth 3 (three) times daily as needed for dizziness., Disp: 90 tablet, Rfl: 6   metFORMIN (GLUCOPHAGE) 500 MG tablet, Take 1 tablet (500 mg total) by mouth daily., Disp: 90 tablet, Rfl: 3   OZEMPIC, 0.25 OR 0.5 MG/DOSE, 2 MG/3ML SOPN, INJECT 0.25 MG INTO THE SKIN ONCE A WEEK. FOR 4 WEEKS AND THEN INCREASE TO 0.'5MG'$  WEEKLY., Disp: 3 mL, Rfl: 1   propranolol (INDERAL) 10 MG tablet, Take 1 tablet by mouth up to four times daily as needed for palpitations/SVT, Disp: 120 tablet, Rfl: 3   zaleplon (SONATA) 5 MG capsule, Take 1 capsule (5 mg total) by mouth at bedtime as needed. for sleep, Disp: 30 capsule, Rfl: 1 Medication Side Effects: none  Family Medical/ Social History: Changes? No  MENTAL HEALTH EXAM:  There were no vitals taken for this visit.There is no height or weight on file to calculate BMI.  General Appearance: Casual, Neat, and Well Groomed  Eye Contact:  Good  Speech:  Clear and Coherent  Volume:  Normal  Mood:  NA  Affect:  Congruent  Thought Process:  Coherent  Orientation:  Full (Time,  Place, and Person)  Thought Content: Logical   Suicidal Thoughts:  No  Homicidal Thoughts:  No  Memory:  WNL  Judgement:  Good  Insight:  Good  Psychomotor Activity:  Normal  Concentration:  Concentration: Good  Recall:  Good  Fund of Knowledge: Good  Language: Good  Assets:  Desire for Improvement  ADL's:  Intact  Cognition: WNL  Prognosis:  Good    DIAGNOSES: No diagnosis found.  Receiving Psychotherapy: No   RECOMMENDATIONS:   Continue Vraylar to 3 mg daily Continue Wellbutrin 300 mg XL daily Continue Valium  5 mg tablet twice  daily Continue Lamictal 100 mg twice daily Follow up in 12 months per patient to reassess.  To report any worsening symptoms  or side effects promptly Monitor any increase in dizziness and notify if worsens Greater than 50% of 30 min face to face time with patient was spent on counseling and coordination of care. We discussed his current stability with bipolar disorder. He has not experienced any manic or depressive episodes since last visit.  Discussed potential benefits, risk, and side effects of benzodiazepines to include potential risk of tolerance and dependence, as well as possible drowsiness.  Advised patient not to drive if experiencing drowsiness and to take lowest possible effective dose to minimize risk of dependence and tolerance.  Patient will be traveling out of country extensively for job and request 12 month follow up.  Reviewed patients most recent labs and nothing of concern this visit.  PDMP reviewed.  Elwanda [place], NP

## 2021-10-13 ENCOUNTER — Other Ambulatory Visit: Payer: Self-pay | Admitting: *Deleted

## 2021-10-13 DIAGNOSIS — E785 Hyperlipidemia, unspecified: Secondary | ICD-10-CM

## 2021-10-13 MED ORDER — VITAMIN B-12 1000 MCG PO TABS: 1000.0000 ug | ORAL_TABLET | Freq: Every day | ORAL | 1 refills | Status: DC

## 2021-10-13 NOTE — Telephone Encounter (Signed)
Ok to send in rx for b12 1080mg daily.  CAlgis Greenhouse PJerline Pain MD 10/13/2021 10:11 AM

## 2021-10-14 ENCOUNTER — Encounter: Payer: Self-pay | Admitting: Internal Medicine

## 2021-10-21 ENCOUNTER — Other Ambulatory Visit: Payer: Self-pay | Admitting: Cardiovascular Disease

## 2021-10-24 ENCOUNTER — Other Ambulatory Visit: Payer: Self-pay

## 2021-10-24 DIAGNOSIS — G4733 Obstructive sleep apnea (adult) (pediatric): Secondary | ICD-10-CM

## 2021-10-24 NOTE — Telephone Encounter (Signed)
Okay to place order for new mask per patient request.  Thank you.

## 2021-10-24 NOTE — Telephone Encounter (Signed)
Ok to provide? Last supply order in march was for Mask, headgear, cushions, filters, heated tubing and water chamber. Not sure if that covers nasal pillow.

## 2021-10-31 ENCOUNTER — Other Ambulatory Visit: Payer: Self-pay | Admitting: Family Medicine

## 2021-11-02 DIAGNOSIS — G4733 Obstructive sleep apnea (adult) (pediatric): Secondary | ICD-10-CM | POA: Diagnosis not present

## 2021-11-04 DIAGNOSIS — G4733 Obstructive sleep apnea (adult) (pediatric): Secondary | ICD-10-CM | POA: Diagnosis not present

## 2021-11-08 ENCOUNTER — Encounter: Payer: Self-pay | Admitting: Family Medicine

## 2021-11-09 NOTE — Telephone Encounter (Signed)
Please advise 

## 2021-11-10 NOTE — Telephone Encounter (Signed)
Do not need to start metformin if he is only going to be out of medication for a week or two.

## 2021-11-15 NOTE — Telephone Encounter (Signed)
Spoke with patient, stated he pick up Rx yesterday

## 2021-11-21 ENCOUNTER — Ambulatory Visit (AMBULATORY_SURGERY_CENTER): Payer: BC Managed Care – PPO | Admitting: *Deleted

## 2021-11-21 ENCOUNTER — Other Ambulatory Visit: Payer: Self-pay

## 2021-11-21 VITALS — Ht 70.0 in | Wt 194.0 lb

## 2021-11-21 DIAGNOSIS — Z1211 Encounter for screening for malignant neoplasm of colon: Secondary | ICD-10-CM

## 2021-11-21 MED ORDER — NA SULFATE-K SULFATE-MG SULF 17.5-3.13-1.6 GM/177ML PO SOLN: 1.0000 | Freq: Once | ORAL | 0 refills | Status: AC

## 2021-11-21 NOTE — Progress Notes (Signed)
Telephone virtual visit Instructions per My chart No egg or soy allergy known to patient  No issues known to pt with past sedation with any surgeries or procedures Patient denies ever being told they had issues or difficulty with intubation  No FH of Malignant Hyperthermia Pt is not on diet pills Pt is not on  home 02  Pt is not on blood thinners  Pt denies issues with constipation  3 times a week Pt encouraged to use to use Singlecare or Goodrx to reduce cost  Patient's chart reviewed by Osvaldo Angst CNRA prior to previsit and patient appropriate for the [place].  Previsit completed and red dot placed by patient's name on their procedure day (on provider's schedule).  Marland Kitchen

## 2021-12-04 DIAGNOSIS — G4733 Obstructive sleep apnea (adult) (pediatric): Secondary | ICD-10-CM | POA: Diagnosis not present

## 2021-12-15 DIAGNOSIS — G4733 Obstructive sleep apnea (adult) (pediatric): Secondary | ICD-10-CM | POA: Diagnosis not present

## 2021-12-22 ENCOUNTER — Encounter: Payer: Self-pay | Admitting: Internal Medicine

## 2021-12-28 ENCOUNTER — Encounter: Payer: Self-pay | Admitting: Internal Medicine

## 2021-12-28 ENCOUNTER — Ambulatory Visit (AMBULATORY_SURGERY_CENTER): Payer: BC Managed Care – PPO | Admitting: Internal Medicine

## 2021-12-28 VITALS — BP 115/82 | HR 70 | Temp 98.0°F | Resp 16 | Ht 69.0 in | Wt 194.0 lb

## 2021-12-28 DIAGNOSIS — Z1211 Encounter for screening for malignant neoplasm of colon: Secondary | ICD-10-CM

## 2021-12-28 DIAGNOSIS — D122 Benign neoplasm of ascending colon: Secondary | ICD-10-CM | POA: Diagnosis not present

## 2021-12-28 MED ORDER — SODIUM CHLORIDE 0.9 % IV SOLN: 500.0000 mL | Freq: Once | INTRAVENOUS | Status: DC

## 2021-12-28 NOTE — Progress Notes (Signed)
Pt's states no medical or surgical changes since previsit or office visit. 

## 2021-12-28 NOTE — Progress Notes (Signed)
Called to room to assist during endoscopic procedure.  Patient ID and intended procedure confirmed with present staff. Received instructions for my participation in the procedure from the performing physician.  

## 2021-12-28 NOTE — Progress Notes (Signed)
HISTORY OF PRESENT ILLNESS:  Todd Harris is a 61 y.o. male who presents today for screening colonoscopy.  Index screening examination 2013 was negative for neoplasia.  No complaints  REVIEW OF SYSTEMS:  All non-GI ROS negative. Past Medical History:  Diagnosis Date   Anxiety    Atrial arrhythmia    Barrett esophagus    Bipolar 1 disorder ([employer])    Cavernous angioma    Chronic kidney disease    Depression    Diabetes mellitus without complication (HCC)    GERD (gastroesophageal reflux disease)    History of chicken pox    Hypertension    Seizures ([place])    1 in 2005, 1 in 2016    Sleep apnea    Stroke ([place]) 11/2014   No residual deficits   Venous malformation    Brain    Past Surgical History:  Procedure Laterality Date   APPENDECTOMY  1970   age 47   bone spur     age 17   kidney stones     RAD ONC MR BRAIN GAMMA KNIFE  12/05/2016   TONSILLECTOMY  7026   UMBILICAL HERNIA REPAIR N/A 07/24/2018   Procedure: HERNIA REPAIR UMBILICAL ADULT;  Surgeon: Stark Klein, MD;  Location: [facility];  Service: General;  Laterality: N/A;    Social History Lindon Romp  reports that he has never smoked. He has never used smokeless tobacco. He reports current alcohol use of about 4.0 standard drinks of alcohol per week. He reports that he does not use drugs.  family history includes Aneurysm in his maternal grandmother; Anxiety disorder in his mother; Asthma in his mother; Bipolar disorder in his maternal grandmother and mother; Cancer in his father; Depression in his mother; Diabetes in his brother, brother, father, mother, paternal grandfather, and paternal grandmother; Hypertension in his mother; Pancreatic cancer in his father.  No Known Allergies     PHYSICAL EXAMINATION: Vital signs: BP (!) 147/73   Pulse 72   Temp 98 F (36.7 C)   Ht '5\' 9"'$  (1.753 m)   Wt 194 lb (88 kg)   SpO2 97%   BMI 28.65 kg/m  General: Well-developed, well-nourished, no acute distress HEENT: Sclerae  are anicteric, conjunctiva pink. Oral mucosa intact Lungs: Clear Heart: Regular Abdomen: soft, nontender, nondistended, no obvious ascites, no peritoneal signs, normal bowel sounds. No organomegaly. Extremities: No edema Psychiatric: alert and oriented x3. Cooperative     ASSESSMENT:  Colon cancer screening   PLAN:  Screening colonoscopy

## 2021-12-28 NOTE — Op Note (Signed)
Todd Harris: Todd Harris Procedure Date: 12/28/2021 11:35 AM MRN: [id number] Endoscopist: Docia Chuck. [name] , MD, [id number] Age: 61 Referring MD:  Date of Birth: Mar 10, 1960 Gender: Male Account #: [id number] Procedure:                Colonoscopy with cold snare polypectomy x 1 Indications:              Screening for colorectal malignant neoplasm.                            Negative index exam 2013 Medicines:                Monitored Anesthesia Care Procedure:                Pre-Anesthesia Assessment:                           - Prior to the procedure, a History and Physical                            was performed, and patient medications and                            allergies were reviewed. The patient's tolerance of                            previous anesthesia was also reviewed. The risks                            and benefits of the procedure and the sedation                            options and risks were discussed with the patient.                            All questions were answered, and informed consent                            was obtained. Prior Anticoagulants: The patient has                            taken no anticoagulant or antiplatelet agents.                            After reviewing the risks and benefits, the patient                            was deemed in satisfactory condition to undergo the                            procedure.                           After obtaining informed consent, the colonoscope  was passed under direct vision. Throughout the                            procedure, the patient's blood pressure, pulse, and                            oxygen saturations were monitored continuously. The                            Olympus CF-HQ190L ([id number]) Colonoscope was                            introduced through the anus and advanced to the the                            cecum, identified by  appendiceal orifice and                            ileocecal valve. The ileocecal valve, appendiceal                            orifice, and rectum were photographed. The quality                            of the bowel preparation was excellent. The                            colonoscopy was performed without difficulty. The                            patient tolerated the procedure well. The bowel                            preparation used was SUPREP via split dose                            instruction. Scope In: 11:43:14 AM Scope Out: 11:55:56 AM Scope Withdrawal Time: 0 hours 10 minutes 30 seconds  Total Procedure Duration: 0 hours 12 minutes 42 seconds  Findings:                 A 5 mm polyp was found in the ascending colon. The                            polyp was removed with a cold snare. Resection and                            retrieval were complete.                           Multiple diverticula were found in the left colon                            and right colon.  The exam was otherwise without abnormality on                            direct and retroflexion views. Complications:            No immediate complications. Estimated blood loss:                            None. Estimated Blood Loss:     Estimated blood loss: none. Impression:               - One 5 mm polyp in the ascending colon, removed                            with a cold snare. Resected and retrieved.                           - Diverticulosis in the left colon and in the right                            colon.                           - The examination was otherwise normal on direct                            and retroflexion views. Recommendation:           - Repeat colonoscopy in 7 years for surveillance,                            if polyp adenomatous.                           - Patient has a contact number available for                            emergencies. The signs  and symptoms of potential                            delayed complications were discussed with the                            patient. Return to normal activities tomorrow.                            Written discharge instructions were provided to the                            patient.                           - Resume previous diet.                           - Continue present medications.                           -  Await pathology results. Docia Chuck. [name], MD 12/28/2021 12:03:54 PM This report has been signed electronically.

## 2021-12-28 NOTE — Patient Instructions (Addendum)
Handout on polyps given to patient.  Await pathology results. Resume previous diet and continue present medications. Repeat colonoscopy for surveillance   YOU HAD AN ENDOSCOPIC PROCEDURE TODAY AT [facility]:   Refer to the procedure report that was given to you for any specific questions about what was found during the examination.  If the procedure report does not answer your questions, please call your gastroenterologist to clarify.  If you requested that your care partner not be given the details of your procedure findings, then the procedure report has been included in a sealed envelope for you to review at your convenience later.  YOU SHOULD EXPECT: Some feelings of bloating in the abdomen. Passage of more gas than usual.  Walking can help get rid of the air that was put into your GI tract during the procedure and reduce the bloating. If you had a lower endoscopy (such as a colonoscopy or flexible sigmoidoscopy) you may notice spotting of blood in your stool or on the toilet paper. If you underwent a bowel prep for your procedure, you may not have a normal bowel movement for a few days.  Please Note:  You might notice some irritation and congestion in your nose or some drainage.  This is from the oxygen used during your procedure.  There is no need for concern and it should clear up in a day or so.  SYMPTOMS TO REPORT IMMEDIATELY:  Following lower endoscopy (colonoscopy or flexible sigmoidoscopy):  Excessive amounts of blood in the stool  Significant tenderness or worsening of abdominal pains  Swelling of the abdomen that is new, acute  Fever of 100F or higher  For urgent or emergent issues, a gastroenterologist can be reached at any hour by calling [phone]. Do not use MyChart messaging for urgent concerns.    DIET:  We do recommend a small meal at first, but then you may proceed to your regular diet.  Drink plenty of fluids but you should avoid alcoholic  beverages for 24 hours.  ACTIVITY:  You should plan to take it easy for the rest of today and you should NOT DRIVE or use heavy machinery until tomorrow (because of the sedation medicines used during the test).    FOLLOW UP: Our staff will call the number listed on your records the next business day following your procedure.  We will call around 7:15- 8:00 am to check on you and address any questions or concerns that you may have regarding the information given to you following your procedure. If we do not reach you, we will leave a message.     If any biopsies were taken you will be contacted by phone or by letter within the next 1-3 weeks.  Please call us at [phone] if you have not heard about the biopsies in 3 weeks.    SIGNATURES/CONFIDENTIALITY: You and/or your care partner have signed paperwork which will be entered into your electronic medical record.  These signatures attest to the fact that that the information above on your After Visit Summary has been reviewed and is understood.  Full responsibility of the confidentiality of this discharge information lies with you and/or your care-partner.

## 2021-12-28 NOTE — Progress Notes (Signed)
A and O x3. Report to RN. Tolerated MAC anesthesia well. 

## 2021-12-29 ENCOUNTER — Telehealth: Payer: Self-pay

## 2021-12-29 NOTE — Telephone Encounter (Signed)
Attempted to reach patient for post-procedure f/u call. No answer. Left message for him to please not hesitate to call us if he has any questions/concerns regarding his care. 

## 2021-12-30 ENCOUNTER — Encounter: Payer: Self-pay | Admitting: Internal Medicine

## 2021-12-30 ENCOUNTER — Other Ambulatory Visit: Payer: Self-pay | Admitting: Family Medicine

## 2021-12-30 DIAGNOSIS — E785 Hyperlipidemia, unspecified: Secondary | ICD-10-CM

## 2021-12-31 ENCOUNTER — Other Ambulatory Visit: Payer: Self-pay | Admitting: Family Medicine

## 2022-01-04 DIAGNOSIS — G4733 Obstructive sleep apnea (adult) (pediatric): Secondary | ICD-10-CM | POA: Diagnosis not present

## 2022-01-05 ENCOUNTER — Other Ambulatory Visit: Payer: Self-pay | Admitting: Family Medicine

## 2022-01-15 DIAGNOSIS — G4733 Obstructive sleep apnea (adult) (pediatric): Secondary | ICD-10-CM | POA: Diagnosis not present

## 2022-02-15 DIAGNOSIS — G4733 Obstructive sleep apnea (adult) (pediatric): Secondary | ICD-10-CM | POA: Diagnosis not present

## 2022-02-17 ENCOUNTER — Encounter: Payer: Self-pay | Admitting: Cardiovascular Disease

## 2022-02-19 ENCOUNTER — Encounter: Payer: Self-pay | Admitting: Cardiovascular Disease

## 2022-02-19 NOTE — Progress Notes (Unsigned)
Cardiology Office Note:    Date:  02/20/2022   ID:  Todd Harris, DOB 1960/11/15, MRN [id number]  PCP:  Vivi Barrack, MD   [facility] Providers Cardiologist:  Dove Gresham  Click to update primary MD,subspecialty MD or APP then REFRESH:1}    Referring MD: Vivi Barrack, MD   Chief Complaint  Patient presents with   Shortness of Breath    History of Present Illness:   06/17/2021    Todd Harris is a 62 y.o. male with a hx of vertigo Pontine stroke 5 years ago If his HR increased, his vertigo will get very bad  [place],  his vertigo worsened and his left arm numbness worsened. Resolved when he stopped to rest   Swimmy headed with exercise  Does have orthostatic hypotension  Gets vertigo when looking up  Gets dizzy while using the "Harrie Jeans it" dog throwing toy.    Has an apple watch,  when his HR might get into the 90.  Has not check his BP with this   We discussed his water intake, food intake  I'm curious to know if he is volume depleted which explains his neuro symptoms and his slightly higher than normal HR   Breakfast - high fiber proten bar, coffee Snack - mixed nuts with almonds Lunch - sandwich, water   Dinner - meat and veggie, bread ,water  Snack - mixed nuts  or popcorn   Has vertigo when lying back and with sitting up.    Feb. 19, 2024 Todd Harris is seen for follow up of her exertional vertigo  He had a GXT in July, 2023 which was normal . No CP , HR and BP issues to explain his dizziness  We encouraged him to improve his hydration  Had a severe episode of dizziness 4 days ago  Lasted for 2 days  Has HR recordings  Took propranolol Q 6 hours , total of 6 tablets   He was able to upload his Apple Watch tracings.  He is having episodes of paroxysmal atrial fibrillation.  Hx of HTN,  Age 47, Hx of CVA  ( 5 years ) to to an caverenous angioma  Sees neurosurgery for his cavernous angioma   Sees Frann Rider , NP , and Georgia Dom , MD for neuro  Achillies will likley need to be started on oral anticoagulation at some point . I suspect the presence of cavernous hemangiomas may be a contraindication.  I discussed the case with Dr. Lavell Anchors and Frann Rider.  I will also have him see Dr. Lars Mage for consideration of Watchman procedure +/- A-fib ablation.    Past Medical History:  Diagnosis Date   Anxiety    Atrial arrhythmia    Barrett esophagus    Bipolar 1 disorder ([place])    Cavernous angioma    Chronic kidney disease    Depression    Diabetes mellitus without complication (HCC)    GERD (gastroesophageal reflux disease)    History of chicken pox    Hypertension    Seizures ([place])    1 in 2005, 1 in 2016    Sleep apnea    Stroke ([place]) 11/2014   No residual deficits   Venous malformation    Brain    Past Surgical History:  Procedure Laterality Date   APPENDECTOMY  1970   age 7   bone spur     age 78   kidney stones  RAD ONC MR BRAIN GAMMA KNIFE  12/05/2016   TONSILLECTOMY  99991111   UMBILICAL HERNIA REPAIR N/A 07/24/2018   Procedure: HERNIA REPAIR UMBILICAL ADULT;  Surgeon: Stark Klein, MD;  Location: [facility];  Service: General;  Laterality: N/A;    Current Medications: Current Meds  Medication Sig   atorvastatin (LIPITOR) 40 MG tablet TAKE 1 TABLET BY MOUTH EVERY DAY   buPROPion (WELLBUTRIN XL) 300 MG 24 hr tablet Take one tablet daily.   cariprazine (VRAYLAR) 3 MG capsule Take 1 capsule (3 mg total) by mouth daily.   diazepam (VALIUM) 5 MG tablet Take 1 tablet (5 mg total) by mouth 2 (two) times daily.   icosapent Ethyl (VASCEPA) 1 g capsule TAKE 2 CAPSULES BY MOUTH 2 TIMES DAILY.   ipratropium (ATROVENT) 0.03 % nasal spray INSTILL 2 SPRAYS INTO BOTH NOSTRILS EVERY 12 HOURS.   lamoTRIgine (LAMICTAL) 100 MG tablet Take 1 tablet (100 mg total) by mouth 2 (two) times daily.   lisinopril-hydrochlorothiazide (ZESTORETIC) 20-25 MG tablet TAKE 1 TABLET BY MOUTH EVERY DAY   meclizine  (MEDI-MECLIZINE) 25 MG tablet Take 1 tablet (25 mg total) by mouth 3 (three) times daily as needed for dizziness.   propranolol (INDERAL) 10 MG tablet TAKE 1 TABLET BY MOUTH UP TO FOUR TIMES DAILY AS NEEDED FOR PALPITATIONS/SVT   Semaglutide,0.25 or 0.5MG/DOS, (OZEMPIC, 0.25 OR 0.5 MG/DOSE,) 2 MG/3ML SOPN INJECT 0.25 MG INTO THE SKIN ONCE A WEEK. FOR 4 WEEKS AND THEN INCREASE TO 0.5MG WEEKLY.     Allergies:   Patient has no known allergies.   Social History   Socioeconomic History   Marital status: Married    Spouse name: [name]    Number of children: 3   Years of education: 16+   Highest education level: Not on file  Occupational History   Occupation: IT department - Engineer, production   Occupation: Health and safety inspector    Comment: Mudlogger  Tobacco Use   Smoking status: Never   Smokeless tobacco: Never  Vaping Use   Vaping Use: Never used  Substance and Sexual Activity   Alcohol use: Yes    Alcohol/week: 4.0 standard drinks of alcohol    Types: 4 Standard drinks or equivalent per week    Comment: was 4-5 beers/week   Drug use: No   Sexual activity: Not Currently  Other Topics Concern   Not on file  Social History Narrative   Lives in [place].    Married. 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      Caffeine use: 2 cup coffee per day   Social Determinants of Health   Financial Resource Strain: Not on file  Food Insecurity: Not on file  Transportation Needs: Not on file  Physical Activity: Not on file  Stress: Not on file  Social Connections: Not on file     Family History: The patient's family history includes Aneurysm in his maternal grandmother; Anxiety disorder in his mother; Asthma in his mother; Bipolar disorder in his maternal grandmother and mother; Cancer in his father; Depression in his mother; Diabetes in his brother, brother, father, mother, paternal grandfather, and paternal grandmother;  Hypertension in his mother; Pancreatic cancer in his father. There is no history of Colon cancer, Colon polyps, Stomach cancer, Rectal cancer, or Esophageal cancer.  ROS:   Please see the history of present illness.     All other systems reviewed and  are negative.  EKGs/Labs/Other Studies Reviewed:    The following studies were reviewed today:   EKG:    Feb. 19, 2024:  NSR at 66.  Voltage for LVH.    Recent Labs: 06/07/2021: ALT 27; TSH 0.98 09/24/2021: BUN 31; Creatinine, Ser 1.15; Hemoglobin 15.3; Platelets 204; Potassium 4.2; Sodium 138  Recent Lipid Panel    Component Value Date/Time   CHOL 136 06/07/2021 1103   TRIG 192.0 (H) 06/07/2021 1103   HDL 42.10 06/07/2021 1103   CHOLHDL 3 06/07/2021 1103   VLDL 38.4 06/07/2021 1103   LDLCALC 55 06/07/2021 1103   LDLCALC 82 05/02/2019 1536   LDLDIRECT 58.0 07/04/2019 0902     Risk Assessment/Calculations:           Physical Exam:    Physical Exam: Blood pressure 122/78, pulse 66, height 5' 9"$  (1.753 m), weight 201 lb (91.2 kg), SpO2 97 %.       GEN:  Well nourished, well developed in no acute distress HEENT: Normal NECK: No JVD; No carotid bruits LYMPHATICS: No lymphadenopathy CARDIAC: RRR , no murmurs, rubs, gallops RESPIRATORY:  Clear to auscultation without rales, wheezing or rhonchi  ABDOMEN: Soft, non-tender, non-distended MUSCULOSKELETAL:  No edema; No deformity  SKIN: Warm and dry NEUROLOGIC:  Alert and oriented x 3   ASSESSMENT:    No diagnosis found.  PLAN:    In order of problems listed above:  Exercise-induced vertigo: [place] has vertigo symptoms whenever he exercises.  He has a history of a pontine stroke in the past.  Have never heard of exercise-induced vertigo.  I think it is more likely that he is volume depleted and when he exercises he becomes tachycardic and likely drops his blood pressure.  This may exacerbate his pontine stroke symptoms.   2.  PAF:  he has been found to have paroxsymal  atrial fib on his apple watch.    The epispide lasted 2 days .    CHADS2VASC is 1 ( HTN). He has a hx of a stroke but this was a hemorrhagic bleed from a cavernous hemangioma ( would not consider this in the [place] score for now )  I do think he will need to be considered for anticogulation at some point .  He may benefit from having a Watchman procedure or perhaps an afib ablation.            Medication Adjustments/Labs and Tests Ordered: Current medicines are reviewed at length with the patient today.  Concerns regarding medicines are outlined above.  No orders of the defined types were placed in this encounter.  No orders of the defined types were placed in this encounter.   There are no Patient Instructions on file for this visit.   Signed, Mertie Moores, MD  02/20/2022 11:32 AM    [place]

## 2022-02-20 ENCOUNTER — Encounter: Payer: Self-pay | Admitting: Cardiovascular Disease

## 2022-02-20 ENCOUNTER — Ambulatory Visit: Payer: BC Managed Care – PPO | Attending: Cardiovascular Disease | Admitting: Cardiovascular Disease

## 2022-02-20 VITALS — BP 122/78 | HR 66 | Ht 69.0 in | Wt 201.0 lb

## 2022-02-20 DIAGNOSIS — I4891 Unspecified atrial fibrillation: Secondary | ICD-10-CM | POA: Diagnosis not present

## 2022-02-20 NOTE — Progress Notes (Signed)
Per up to date, "In contrast to conventional wisdom, the available data suggest that the bleeding risk associated with CMs is not increased with the use of antithrombotic or oral anticoagulant therapy" but as CHA2DS2-VASc score only a 1 and no prior hx of embolic stroke, not sure if benefit with [facility] would outweigh potential risk especially with history of ICH at this present time. Would agree with pursuing Watchman procedure. Dr. Jaynee Eagles, what are you thoughts?

## 2022-02-20 NOTE — Patient Instructions (Signed)
Medication Instructions:  Your physician recommends that you continue on your current medications as directed. Please refer to the Current Medication list given to you today.  *If you need a refill on your cardiac medications before your next appointment, please call your pharmacy*  Follow-Up: At [facility], you and your health needs are our priority.  As part of our continuing mission to provide you with exceptional heart care, we have created designated Provider Care Teams.  These Care Teams include your primary Cardiologist (physician) and Advanced Practice Providers (APPs -  Physician Assistants and Nurse Practitioners) who all work together to provide you with the care you need, when you need it.    Your next appointment:   As needed   Provider:   Dr. Acie Fredrickson

## 2022-02-21 ENCOUNTER — Encounter: Payer: Self-pay | Admitting: Cardiovascular Disease

## 2022-03-01 NOTE — Progress Notes (Signed)
Dr Acie Fredrickson discussed his case. With CHADS-Vasc of 1 we agree best to withhold anticoagulation and would not pursue Watchman. LAAO/Watchman with DAPT could be revisited as his CHADS-Vasc increases with age and/or other comorbid conditions.

## 2022-03-05 ENCOUNTER — Other Ambulatory Visit: Payer: Self-pay | Admitting: Family Medicine

## 2022-03-06 ENCOUNTER — Encounter: Payer: Self-pay | Admitting: Family Medicine

## 2022-03-06 ENCOUNTER — Other Ambulatory Visit: Payer: Self-pay | Admitting: *Deleted

## 2022-03-06 MED ORDER — OZEMPIC (0.25 OR 0.5 MG/DOSE) 2 MG/3ML ~~LOC~~ SOPN: 0.5000 mg | PEN_INJECTOR | SUBCUTANEOUS | 1 refills | Status: DC

## 2022-03-06 NOTE — Telephone Encounter (Signed)
Ok to refill but please have him come back to recheck his A1c ASAP.  Algis Greenhouse. Jerline Pain, MD 03/06/2022 10:46 AM

## 2022-03-06 NOTE — Telephone Encounter (Signed)
Please advise 

## 2022-03-07 NOTE — Progress Notes (Unsigned)
[facility]     Primary neurologist: Dr. Jaynee Eagles Sleep neurologist: Dr. Brett Fairy  Primary Care Physician:  Brunetta Jeans, PA-C     CC:  Cavernous Hemangiomas and sleep apnea  No chief complaint on file.    Brief HPI  Todd Harris is a 62 y.o. male with history of OSA on CPAP, right pons ICH secondary to cavernous hemangioma in 09/2016 s/p gamma knife with residual left sided sensory impairment, ataxia and vertigo and hx of seizures on lamotrigine.  Repeat MRI brain 09/2020 for worsening vertigo showed stable appearance of cerebral cavernous angioma, worsening vertigo improved after PT.   At prior visit, 03/07/2021, overall stable.  No change in treatment plan.   Interval history:  Continues to do well.  Continued intermittent vertigo ***.  Continues on lamotrigine 100 mg BID, tolerating well, no seizure activity.  Continues to do well with CPAP.  Review of compliance report over the past 30 days shows 30 out of 30 usage days with 30 days greater than 4 hours for 100% compliance. Average usage 8 hours and 50 minutes.  Residual AHI 0.4 on set pressure of 13.  Leaks in the 95th percentile 18.1.  Of note, he has since been diagnosed with atrial fibrillation since prior visit.  Followed by cardiology Dr. Cathie Olden.  Anticoagulation contraindicated due to known cavernous hemangioma with history of ICH.  CHA2DS2-VASc score of 1 therefore decided to hold off on Watchman procedure at this time.        Update 03/07/2021 JM: 62 year old male with history of OSA on CPAP, right pons ICH secondary to cavernous hemangioma in 09/2016 with residual left sided sensory impairment and ataxia and vertigo and seizures.  Returns today for 70-monthfollow-up visit.  At prior visit, c/o worsening vertigo, MR brain completed 09/2020 which showed stable appearance of cerebral cavernous angiomas without acute bleeds or acute findings.  Completed therapy 11/2020 with improvement of vertigo, PT  did note occasional dizziness and left hand numbness related to increased HR (>95) with exertion. He continues to experience this. Symptoms will resolve once HR normalizes in the 70s. He is not currently being followed by cardiology. 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  Otherwise stable without new stroke/TIA or neurological symptoms.  Remains on lamotrigine 100 mg twice daily without side effects and no seizure activity.    Continues to do well on CPAP for OSA management.  Review of compliance report as below.  Excellent compliance at 100% with optimal residual AHI at 0.6. Will use travel CPAP when traveling for work.  Up-to-date on supplies and closely follows with DME company.              Review of Systems: Patient complains of symptoms per HPI as well as the following symptoms: vertigo. Pertinent negatives and positives per HPI. All others negative.   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    Married. 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      Caffeine use: 2 cup coffee per day   Social Determinants of Health   Financial  Resource Strain: Not on file  Food Insecurity: Not on file  Transportation Needs: Not on file  Physical Activity: Not on file  Stress: Not on file  Social Connections: Not on file  Intimate Partner Violence: Not on file    Family History  Problem Relation Age of Onset   Anxiety disorder Mother    Depression Mother    Bipolar disorder Mother    Asthma Mother    Diabetes Mother    Hypertension Mother    Pancreatic cancer Father    Diabetes Father    Cancer Father        Pancreatic   Diabetes Brother    Diabetes Brother    Bipolar disorder Maternal Grandmother    Aneurysm Maternal Grandmother    Diabetes Paternal Grandmother    Diabetes Paternal Grandfather    Colon cancer Neg Hx    Colon polyps Neg Hx    Stomach cancer Neg Hx    Rectal cancer Neg Hx    Esophageal cancer Neg Hx     Past Medical History:  Diagnosis Date   Anxiety    Atrial arrhythmia    Barrett esophagus    Bipolar 1 disorder ([place])    Cavernous angioma    Chronic kidney disease    Depression    Diabetes mellitus without complication ([place])    GERD (gastroesophageal reflux disease)    History of chicken pox    Hypertension    Seizures ([place])    1 in 2005, 1 in 2016    Sleep apnea    Stroke ([place]) 11/2014   No residual deficits   Venous malformation    Brain    Past Surgical History:  Procedure Laterality Date   APPENDECTOMY  1970   age 72   bone spur     age 18   kidney stones     RAD ONC MR BRAIN GAMMA KNIFE  12/05/2016   TONSILLECTOMY  99991111   UMBILICAL HERNIA REPAIR N/A 07/24/2018   Procedure: HERNIA REPAIR UMBILICAL ADULT;  Surgeon: Stark Klein, MD;  Location: [facility];  Service: General;  Laterality: N/A;    Current Outpatient Medications  Medication Sig Dispense Refill   atorvastatin (LIPITOR) 40 MG tablet TAKE 1 TABLET BY MOUTH EVERY DAY 90 tablet 1   buPROPion (WELLBUTRIN XL) 300 MG 24 hr tablet Take one tablet daily. 90 tablet 3   cariprazine (VRAYLAR) 3 MG capsule Take 1  capsule (3 mg total) by mouth daily. 90 capsule 3   diazepam (VALIUM) 5 MG tablet Take 1 tablet (5 mg total) by mouth 2 (two) times daily. 180 tablet 3   icosapent Ethyl (VASCEPA) 1 g capsule TAKE 2 CAPSULES BY MOUTH 2 TIMES DAILY. 360 capsule 3   ipratropium (ATROVENT) 0.03 % nasal spray INSTILL 2 SPRAYS INTO BOTH NOSTRILS EVERY 12 HOURS. 30 mL 1   lamoTRIgine (LAMICTAL) 100 MG tablet  Take 1 tablet (100 mg total) by mouth 2 (two) times daily. 180 tablet 3   lisinopril-hydrochlorothiazide (ZESTORETIC) 20-25 MG tablet TAKE 1 TABLET BY MOUTH EVERY DAY 90 tablet 1   meclizine (MEDI-MECLIZINE) 25 MG tablet Take 1 tablet (25 mg total) by mouth 3 (three) times daily as needed for dizziness. 90 tablet 6   propranolol (INDERAL) 10 MG tablet TAKE 1 TABLET BY MOUTH UP TO FOUR TIMES DAILY AS NEEDED FOR PALPITATIONS/SVT 360 tablet 2   Semaglutide,0.25 or 0.'5MG'$ /DOS, (OZEMPIC, 0.25 OR 0.5 MG/DOSE,) 2 MG/3ML SOPN Inject 0.5 mg into the skin once a week. for 4 weeks and then  increase to 0.'5mg'$  weekly. 3 mL 1   zaleplon (SONATA) 5 MG capsule Take 1 capsule (5 mg total) by mouth at bedtime as needed. for sleep (Patient not taking: Reported on 02/20/2022) 30 capsule 1   No current facility-administered medications for this visit.    Allergies as of 03/08/2022   (No Known Allergies)   Physical exam: There were no vitals filed for this visit.   There is no height or weight on file to calculate BMI.  General: well developed, well nourished, very pleasant middle-age Caucasian male, seated, in no evident distress Head: head normocephalic and atraumatic.   Neck: supple with no carotid or supraclavicular bruits Cardiovascular: regular rate and rhythm, no murmurs Musculoskeletal: no deformity Skin:  no rash/petichiae Vascular:  Normal pulses all extremities   Neurologic Exam Mental Status: Awake and fully alert. Oriented to place and time. Recent and remote memory intact. Attention span, concentration and fund  of knowledge appropriate. Mood and affect appropriate.  Cranial Nerves: Pupils equal, briskly reactive to light. Extraocular movements full without nystagmus. Visual fields full to confrontation. Hearing intact. Facial sensation intact. Face, tongue, palate moves normally and symmetrically.  Motor: Normal bulk and tone. Normal strength in all tested extremity muscles Sensory.:  Left hemisensory loss Coordination: Rapid alternating movements normal in all extremities. Finger-to-nose and heel-to-shin shows mild left sided ataxia. Gait and Station: Arises from chair without difficulty. Stance is normal. Gait demonstrates normal stride length and mild imbalance without use of assistive device.  Unable to complete tandem walk and heel toe. Romberg positive Reflexes: 1+ and symmetric. Toes downgoing.           Assessment/Plan:   Todd Harris is a 62 y.o. male  with history of multiple Cavernous Hemangiomas with right pons ICH secondary to cavernous angioma in 09/2016 with residual left hemiparesthesia, ataxia, vertigo. he is status post gamma knife on the pontine lesion.  History of seizures stable on Lamictal.  Also has history of sleep apnea on CPAP for which is currently managed by Dr. Brett Fairy.  New diagnosis of atrial fibrillation, anticoagulation contraindicated, CHA2DS2-VASc score 1, decided by cardiology to hold off on Watchman procedure at this time unless CHA2DS2-VASc score increases with age and/or other comorbid conditions.    Hx of ICH Cavernous angiomas Chronic vertigo -stable -prior MRI brain 09/2020 stable appearance of cerebral cavernous angioma without evidence of acute bleed or acute findings -Intermittent dizziness/vertigo and left hand numbness with increased heart rate (>95) - advised to further discuss with PCP for possible need of cardiology evaluation -no longer follows with neurosurgery -Continue meclizine and Zofran as needed - refill up to date   OSA:  -Excellent  compliance (100%) with optimal residual AHI (0.4).  Continue current pressure setting.  Discussed importance of continued nightly usage with ensuring greater than 4 hours per night for optimal benefit.  We will continue to follow with DME company for any needed supplies or CPAP related concerns. -Obtain machine 12/2015 - new machine ***   Seizures:  -Continue lamotrigine 100 mg twice daily for seizure prophylaxis -refill up to date -Lamotrigine level 09/07/2020 2.9 - repeat today       No orders of the defined types were placed in this encounter.   No orders of the defined types were placed in this encounter.    Follow-up in 6 months to follow-up earlier if needed    Cc:  Vivi Barrack, MD - pcp   I spent 36 minutes of face-to-face and non-face-to-face time with patient.  This included previsit chart review, lab review, study review, order entry, electronic health record documentation, patient education and discussion regarding below diagnoses, further evaluations and treatment options:  No diagnosis found.     Frann Rider, AGNP-BC  [facility] [address] [place], [place] [address]  Phone [phone] Fax [phone] Note: This document was prepared with digital dictation and possible smart phrase technology. Any transcriptional errors that result from this process are unintentional.

## 2022-03-08 ENCOUNTER — Encounter: Payer: Self-pay | Admitting: Family Medicine

## 2022-03-08 ENCOUNTER — Telehealth: Payer: Self-pay

## 2022-03-08 ENCOUNTER — Ambulatory Visit (INDEPENDENT_AMBULATORY_CARE_PROVIDER_SITE_OTHER): Payer: BC Managed Care – PPO | Admitting: Adult Health

## 2022-03-08 ENCOUNTER — Encounter: Payer: Self-pay | Admitting: Adult Health

## 2022-03-08 VITALS — BP 118/78 | HR 67 | Ht 69.5 in | Wt 200.0 lb

## 2022-03-08 DIAGNOSIS — H814 Vertigo of central origin: Secondary | ICD-10-CM

## 2022-03-08 DIAGNOSIS — Z5181 Encounter for therapeutic drug level monitoring: Secondary | ICD-10-CM | POA: Diagnosis not present

## 2022-03-08 DIAGNOSIS — D1802 Hemangioma of intracranial structures: Secondary | ICD-10-CM | POA: Diagnosis not present

## 2022-03-08 DIAGNOSIS — I613 Nontraumatic intracerebral hemorrhage in brain stem: Secondary | ICD-10-CM | POA: Diagnosis not present

## 2022-03-08 DIAGNOSIS — G4733 Obstructive sleep apnea (adult) (pediatric): Secondary | ICD-10-CM | POA: Diagnosis not present

## 2022-03-08 DIAGNOSIS — G40209 Localization-related (focal) (partial) symptomatic epilepsy and epileptic syndromes with complex partial seizures, not intractable, without status epilepticus: Secondary | ICD-10-CM | POA: Diagnosis not present

## 2022-03-08 MED ORDER — LAMOTRIGINE 100 MG PO TABS: 100.0000 mg | ORAL_TABLET | Freq: Two times a day (BID) | ORAL | 3 refills | Status: DC

## 2022-03-08 NOTE — Patient Instructions (Addendum)
Continue nightly use of CPAP for adequate sleep apnea management  You will be called to schedule a sleep study in order to obtain a new CPAP machine  Continue to follow with your DME company for any needed supplies or CPAP related concerns  Continue lamotrigine 100 mg twice daily for seizure prevention, will check lab work today  Continue to follow with your primary doctor and cardiologist as advised  We will plan on repeat MRI next year at this time but if any worsening symptoms please let me know! If any sudden onset worsening symptoms or new stroke type symptoms, please call 911 immediately for emergent evaluation   Please let me know if you would like to participate in any therapy to further help with compensation strategies     Follow up in 1 year or call earlier if needed

## 2022-03-09 ENCOUNTER — Encounter: Payer: Self-pay | Admitting: Family Medicine

## 2022-03-09 ENCOUNTER — Ambulatory Visit (INDEPENDENT_AMBULATORY_CARE_PROVIDER_SITE_OTHER): Payer: BC Managed Care – PPO | Admitting: Family Medicine

## 2022-03-09 VITALS — BP 126/84 | HR 66 | Temp 97.5°F | Ht 69.5 in | Wt 198.8 lb

## 2022-03-09 DIAGNOSIS — I4891 Unspecified atrial fibrillation: Secondary | ICD-10-CM | POA: Diagnosis not present

## 2022-03-09 DIAGNOSIS — R569 Unspecified convulsions: Secondary | ICD-10-CM

## 2022-03-09 DIAGNOSIS — R7303 Prediabetes: Secondary | ICD-10-CM

## 2022-03-09 LAB — COMPREHENSIVE METABOLIC PANEL
ALT: 138 IU/L — ABNORMAL HIGH (ref 0–44)
AST: 47 IU/L — ABNORMAL HIGH (ref 0–40)
Albumin/Globulin Ratio: 2.7 — ABNORMAL HIGH (ref 1.2–2.2)
Albumin: 4.8 g/dL (ref 3.9–4.9)
Alkaline Phosphatase: 76 IU/L (ref 44–121)
BUN/Creatinine Ratio: 24 (ref 10–24)
BUN: 22 mg/dL (ref 8–27)
Bilirubin Total: 0.5 mg/dL (ref 0.0–1.2)
CO2: 27 mmol/L (ref 20–29)
Calcium: 10.4 mg/dL — ABNORMAL HIGH (ref 8.6–10.2)
Chloride: 100 mmol/L (ref 96–106)
Creatinine, Ser: 0.93 mg/dL (ref 0.76–1.27)
Globulin, Total: 1.8 g/dL (ref 1.5–4.5)
Glucose: 99 mg/dL (ref 70–99)
Potassium: 4.5 mmol/L (ref 3.5–5.2)
Sodium: 142 mmol/L (ref 134–144)
Total Protein: 6.6 g/dL (ref 6.0–8.5)
eGFR: 92 mL/min/{1.73_m2} (ref >59)

## 2022-03-09 LAB — POCT GLYCOSYLATED HEMOGLOBIN (HGB A1C): Hemoglobin A1C: 5.6 % (ref 4.0–5.6)

## 2022-03-09 LAB — LAMOTRIGINE LEVEL: Lamotrigine Lvl: 3.2 ug/mL (ref 2.0–20.0)

## 2022-03-09 NOTE — Assessment & Plan Note (Signed)
Continue management per cardiology.  Has upcoming appointment with electrophysiology and may be considering ablation however patient is somewhat hesitant about this.

## 2022-03-09 NOTE — Telephone Encounter (Signed)
We discussed this at his office visit today.  Algis Greenhouse. Jerline Pain, MD 03/09/2022 3:11 PM

## 2022-03-09 NOTE — Assessment & Plan Note (Signed)
Doing very well with Ozempic 0.5 mg weekly.  A1c today 5.6.  He is down about 40 pounds.  No significant side effects.  Will continue current dose.  He will come back in 6 months and we can recheck A1c at that time.

## 2022-03-09 NOTE — Assessment & Plan Note (Signed)
Follows with neurology.  On Lamictal 100 mg twice daily.  May be contributing to transaminases as above.

## 2022-03-09 NOTE — Progress Notes (Signed)
   Todd Harris is a 62 y.o. male who presents today for an office visit.  Assessment/Plan:  New/Acute Problems: Elevated Transaminases Patient with mild elevation in transaminases yesterday on labs checked by neurology.  His lamotrigine level has not yet returned.  Discussed with patient and will probably need to wait on all of his labs to come back though may have to switch him off of the Lamictal.  Will defer this decision to neurology.  He has not currently have any symptoms -do not think we need to make any urgent medication changes or repeat testing at this point.  Will probably need to have repeat LFTs done in the next week or so once a decision is made on his lamictal.  He can have this done here or he can have this done via neurology.  Chronic Problems Addressed Today: Seizures ([place]) Follows with neurology.  On Lamictal 100 mg twice daily.  May be contributing to transaminases as above.  Prediabetes Doing very well with Ozempic 0.5 mg weekly.  A1c today 5.6.  He is down about 40 pounds.  No significant side effects.  Will continue current dose.  He will come back in 6 months and we can recheck A1c at that time.  Atrial fibrillation ([place]) Continue management per cardiology.  Has upcoming appointment with electrophysiology and may be considering ablation however patient is somewhat hesitant about this.     Subjective:  HPI:  See A/p for status of chronic conditions.  He is here today for follow-up.  We saw him 9 months ago for annual physical.  Switched him from metformin to [place] for prediabetes.  He has done very well with this.  Since her last visit he had an episode of palpitations.  Went to the ED and was diagnosed with SVT.  He did end up following up again with cardiology for this and was diagnosed for atrial fibrillation.  They are considering possible ablation and he is following up with electrophysiologist in a few weeks.  He has also seen a neurologist since her last  visit.  Recently saw them yesterday for routine follow-up.  Was doing well however got lab results which showed elevated ALT and AST.  This limited level still pending.  Currently not have any symptoms.  No abdominal pain.  No nausea or vomiting.       Objective:  Physical Exam: BP 126/84   Pulse 66   Temp (!) 97.5 F (36.4 C) (Temporal)   Ht 5' 9.5" (1.765 m)   Wt 198 lb 12.8 oz (90.2 kg)   SpO2 98%   BMI 28.94 kg/m   Wt Readings from Last 3 Encounters:  03/09/22 198 lb 12.8 oz (90.2 kg)  03/08/22 200 lb (90.7 kg)  02/20/22 201 lb (91.2 kg)  Gen: No acute distress, resting comfortably CV: Regular rate and rhythm with no murmurs appreciated Pulm: Normal work of breathing, clear to auscultation bilaterally with no crackles, wheezes, or rhonchi ABD: S, NT, ND Neuro: Grossly normal, moves all extremities Psych: Normal affect and thought content      Todd Harris M. Jerline Pain, MD 03/09/2022 2:31 PM

## 2022-03-09 NOTE — Patient Instructions (Signed)
It was very nice to see you today!  Your A1c looks great today.  We will continue Ozempic.  Your liver numbers were mildly elevated.  Your neurologist may want to stop the Lamictal.  Please reach out to them if you have not heard back anything soon.  We will see you back in 6 months for your annual physical.  Come back sooner if needed.  Take care, Dr Jerline Pain  PLEASE NOTE:  If you had any lab tests, please let us know if you have not heard back within a few days. You may see your results on mychart before we have a chance to review them but we will give you a call once they are reviewed by [place].   If we ordered any referrals today, please let us know if you have not heard from their office within the next week.   If you had any urgent prescriptions sent in today, please check with the pharmacy within an hour of our visit to make sure the prescription was transmitted appropriately.   Please try these tips to maintain a healthy lifestyle:  Eat at least 3 REAL meals and 1-2 snacks per day.  Aim for no more than 5 hours between eating.  If you eat breakfast, please do so within one hour of getting up.   Each meal should contain half fruits/vegetables, one quarter protein, and one quarter carbs (no bigger than a computer mouse)  Cut down on sweet beverages. This includes juice, soda, and sweet tea.   Drink at least 1 glass of water with each meal and aim for at least 8 glasses per day  Exercise at least 150 minutes every week.

## 2022-03-10 NOTE — Telephone Encounter (Signed)
Ok for him to come here to recheck next week. Please place future order for CMET.  Algis Greenhouse. Jerline Pain, MD 03/10/2022 11:25 AM

## 2022-03-13 ENCOUNTER — Other Ambulatory Visit: Payer: Self-pay

## 2022-03-13 ENCOUNTER — Other Ambulatory Visit (INDEPENDENT_AMBULATORY_CARE_PROVIDER_SITE_OTHER): Payer: BC Managed Care – PPO

## 2022-03-13 DIAGNOSIS — R748 Abnormal levels of other serum enzymes: Secondary | ICD-10-CM

## 2022-03-13 LAB — COMPREHENSIVE METABOLIC PANEL
ALT: 78 U/L — ABNORMAL HIGH (ref 0–53)
AST: 19 U/L (ref 0–37)
Albumin: 4.6 g/dL (ref 3.5–5.2)
Alkaline Phosphatase: 63 U/L (ref 39–117)
BUN: 21 mg/dL (ref 6–23)
CO2: 33 mEq/L — ABNORMAL HIGH (ref 19–32)
Calcium: 10.3 mg/dL (ref 8.4–10.5)
Chloride: 99 mEq/L (ref 96–112)
Creatinine, Ser: 0.94 mg/dL (ref 0.40–1.50)
GFR: 86.57 mL/min (ref >60.00)
Glucose, Bld: 67 mg/dL — ABNORMAL LOW (ref 70–99)
Potassium: 4.3 mEq/L (ref 3.5–5.1)
Sodium: 140 mEq/L (ref 135–145)
Total Bilirubin: 0.5 mg/dL (ref 0.2–1.2)
Total Protein: 6.6 g/dL (ref 6.0–8.3)

## 2022-03-15 ENCOUNTER — Other Ambulatory Visit: Payer: Self-pay | Admitting: *Deleted

## 2022-03-15 ENCOUNTER — Encounter: Payer: Self-pay | Admitting: Adult Health

## 2022-03-15 DIAGNOSIS — G4733 Obstructive sleep apnea (adult) (pediatric): Secondary | ICD-10-CM | POA: Diagnosis not present

## 2022-03-15 DIAGNOSIS — R748 Abnormal levels of other serum enzymes: Secondary | ICD-10-CM

## 2022-03-15 NOTE — Telephone Encounter (Signed)
Can you assist this patient with this ?

## 2022-03-15 NOTE — Progress Notes (Signed)
Please inform patient of the following:  His liver numbers are much better and almost back to normal.  We should repeat again in a few months.  Please place future order.

## 2022-03-16 DIAGNOSIS — G4733 Obstructive sleep apnea (adult) (pediatric): Secondary | ICD-10-CM | POA: Diagnosis not present

## 2022-03-17 ENCOUNTER — Encounter: Payer: Self-pay | Admitting: Adult Health

## 2022-03-18 ENCOUNTER — Other Ambulatory Visit: Payer: Self-pay | Admitting: Family Medicine

## 2022-03-18 DIAGNOSIS — I10 Essential (primary) hypertension: Secondary | ICD-10-CM

## 2022-03-20 ENCOUNTER — Encounter: Payer: Self-pay | Admitting: Neurology

## 2022-03-20 NOTE — Telephone Encounter (Signed)
From what I can tell, patient requesting note stating that he will need frequent breaks as needed and inability to sit through a long trial. Not sure if this would completely excuse him from jury duty. Can we do an updated letter requesting these recommendations? Thank you!

## 2022-03-22 NOTE — Telephone Encounter (Signed)
Will sign when back in office. Thank you!

## 2022-03-28 ENCOUNTER — Ambulatory Visit: Payer: BC Managed Care – PPO | Admitting: Cardiovascular Disease

## 2022-04-04 ENCOUNTER — Encounter: Payer: Self-pay | Admitting: Adult Health

## 2022-04-04 ENCOUNTER — Other Ambulatory Visit: Payer: Self-pay | Admitting: Behavioral Health

## 2022-04-04 DIAGNOSIS — G4733 Obstructive sleep apnea (adult) (pediatric): Secondary | ICD-10-CM

## 2022-04-04 DIAGNOSIS — F411 Generalized anxiety disorder: Secondary | ICD-10-CM

## 2022-04-11 ENCOUNTER — Institutional Professional Consult (permissible substitution): Payer: BC Managed Care – PPO | Admitting: Cardiology

## 2022-04-11 ENCOUNTER — Ambulatory Visit: Payer: BC Managed Care – PPO | Admitting: Neurology

## 2022-04-11 DIAGNOSIS — I613 Nontraumatic intracerebral hemorrhage in brain stem: Secondary | ICD-10-CM

## 2022-04-11 DIAGNOSIS — G4733 Obstructive sleep apnea (adult) (pediatric): Secondary | ICD-10-CM

## 2022-04-11 DIAGNOSIS — R42 Dizziness and giddiness: Secondary | ICD-10-CM

## 2022-04-11 DIAGNOSIS — D1802 Hemangioma of intracranial structures: Secondary | ICD-10-CM

## 2022-04-11 DIAGNOSIS — I480 Paroxysmal atrial fibrillation: Secondary | ICD-10-CM

## 2022-04-12 ENCOUNTER — Encounter: Payer: Self-pay | Admitting: Cardiovascular Disease

## 2022-04-12 NOTE — Progress Notes (Signed)
[facility] SLEEP TEST REPORT ( by Watch PAT)   STUDY DATE: 04-12-2022  DOB:  [birth year] MRN: [id number]   ORDERING CLINICIAN: Ihor Austin , NP  REFERRING CLINICIAN: Jacquiline Doe, MD/ sees Dr Lucia Gaskins, MD, since12-22-2016.    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   Repeat MRI brain 09/2020 for worsening vertigo showed stable appearance of cerebral cavernous angioma, worsening vertigo improved after PT. Complaints of dizziness that still remains. DME [employer] states machine is working well , set up 12/2015.   Continues to do well with CPAP.  Review of compliance report over the past 30 days shows 30 out of 30 usage days with 30 days greater than 4 hours for 100% compliance. Average usage 8 hours and 50 minutes.  Residual AHI 0.4 on set pressure of 13.  Leaks in the 95th percentile 18.1.  Continues to tolerate well.  Routinely follows with DME company [employer]. He is due for a new machine, interested in starting this process.    Of note, he has since been diagnosed with atrial fibrillation since prior visit.    Epworth sleepiness score: XXX /24.   BMI: 29 kg/m   Neck Circumference: XXX   FINDINGS:   Sleep Summary:   Total Recording Time (hours, min): 9 hours 19 minutes      Total Sleep Time (hours, min): 8 hours 26 minutes                Percent REM (%):    16.6% REM      Sleep latency 9 minutes, REM sleep latency 297 minutes.                                Respiratory Indices:   Calculated pAHI (per hour): 27.1/h                           REM pAHI: 25.3/h                                               NREM pAHI:   27.5/h                           Supine AHI: The patient mostly slept in right lateral with an AHI of 28.8/h followed by left lateral  sleep with an AHI of 18.3/h.  Supine sleep presented with an elevated AHI of 35.6/h.  There was however no position so not associated with significant apnea.  Snoring level reached a mean volume of 41 dB and was present for 13% of sleep time.                                                 Oxygen Saturation Statistics:   O2 Saturation Range (%): Between the nadir at 85% maximum saturation at 99% with a mean saturation of 93%.  O2 Saturation (minutes) <89%: 2.2 minutes         Pulse Rate Statistics:   Pulse Mean (bpm): 68 bpm               Pulse Range:     Between 49 and 98 bpm.            IMPRESSION:  This HST confirms the presence of moderate to severe sleep apnea without REM sleep dependence and without significant hypoxemia.  Snoring was also mild to moderate.  Positional component exacerbated the AHI in supine sleep but avoiding supine sleep alone would not treat this apnea.  I therefore agree to replace and renew his CPAP prescription.   RECOMMENDATION: Auto titration CPAP with a humidified ResMed device, providing a setting between 5 and 16 cm water pressure with 3 cm EPR.   I do not have information about his mask or interface type or size.  Please advise patient to bring the current mask with him.      INTERPRETING PHYSICIAN:   Melvyn Novas, MD   Medical Director of [facility] at [facility].

## 2022-04-15 DIAGNOSIS — G4733 Obstructive sleep apnea (adult) (pediatric): Secondary | ICD-10-CM | POA: Diagnosis not present

## 2022-04-16 DIAGNOSIS — G4733 Obstructive sleep apnea (adult) (pediatric): Secondary | ICD-10-CM | POA: Diagnosis not present

## 2022-04-17 NOTE — Procedures (Signed)
[facility] SLEEP TEST REPORT ( by Watch PAT)   STUDY DATE: 04-12-2022  DOB:  01/12/1960 MRN: [id number]   ORDERING CLINICIAN: Ihor Austin , NP  REFERRING CLINICIAN: Jacquiline Doe, MD/ sees Dr Lucia Gaskins, MD, since12-22-2016.    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   Repeat MRI brain 09/2020 for worsening vertigo showed stable appearance of cerebral cavernous angioma, worsening vertigo improved after PT. Complaints of dizziness that still remains. DME [employer] states machine is working well , set up 12/2015.   Continues to do well with CPAP.  Review of compliance report over the past 30 days shows 30 out of 30 usage days with 30 days greater than 4 hours for 100% compliance. Average usage 8 hours and 50 minutes.  Residual AHI 0.4 on set pressure of 13.  Leaks in the 95th percentile 18.1.  Continues to tolerate well.  Routinely follows with DME company [employer]. He is due for a new machine, interested in starting this process.    Of note, he has since been diagnosed with atrial fibrillation since prior visit.    Epworth sleepiness score: XXX /24.   BMI: 29 kg/m   Neck Circumference: XXX   FINDINGS:   Sleep Summary:   Total Recording Time (hours, min): 9 hours 19 minutes      Total Sleep Time (hours, min): 8 hours 26 minutes                Percent REM (%):    16.6% REM      Sleep latency 9 minutes, REM sleep latency 297 minutes.                                Respiratory Indices:   Calculated pAHI (per hour): 27.1/h                           REM pAHI: 25.3/h                                               NREM pAHI:   27.5/h                           Supine AHI: The patient mostly slept in right lateral with an AHI of 28.8/h followed by left lateral sleep  with an AHI of 18.3/h.  Supine sleep presented with an elevated AHI of 35.6/h.  There was however no position so not associated with significant apnea.  Snoring level reached a mean volume of 41 dB and was present for 13% of sleep time.                                                 Oxygen Saturation Statistics:   O2 Saturation Range (%): Between the nadir at 85% maximum saturation at 99% with a mean saturation of 93%.  O2 Saturation (minutes) <89%: 2.2 minutes         Pulse Rate Statistics:   Pulse Mean (bpm): 68 bpm               Pulse Range:     Between 49 and 98 bpm.            IMPRESSION:  This HST confirms the presence of moderate to severe sleep apnea without REM sleep dependence and without significant hypoxemia.  Snoring was also mild to moderate.  Positional component exacerbated the AHI in supine sleep but avoiding supine sleep alone would not treat this apnea.  I therefore agree to replace and renew his CPAP prescription.   RECOMMENDATION: Auto titration CPAP with a humidified ResMed device, providing a setting between 5 and 16 cm water pressure with 3 cm EPR.   I do not have information about his mask or interface type or size.  Please advise patient to bring the current mask with him.      INTERPRETING PHYSICIAN:   Melvyn Novas, MD  ABPN and ABSM, Board certified in Neurology and Sleep.  Medical Director of [facility] at [employer].

## 2022-04-25 ENCOUNTER — Encounter: Payer: Self-pay | Admitting: Neurology

## 2022-04-27 ENCOUNTER — Other Ambulatory Visit: Payer: Self-pay | Admitting: Family Medicine

## 2022-05-11 DIAGNOSIS — G4733 Obstructive sleep apnea (adult) (pediatric): Secondary | ICD-10-CM | POA: Diagnosis not present

## 2022-05-12 DIAGNOSIS — G4733 Obstructive sleep apnea (adult) (pediatric): Secondary | ICD-10-CM | POA: Diagnosis not present

## 2022-05-15 DIAGNOSIS — G4733 Obstructive sleep apnea (adult) (pediatric): Secondary | ICD-10-CM | POA: Diagnosis not present

## 2022-05-16 DIAGNOSIS — G4733 Obstructive sleep apnea (adult) (pediatric): Secondary | ICD-10-CM | POA: Diagnosis not present

## 2022-05-27 ENCOUNTER — Other Ambulatory Visit: Payer: Self-pay | Admitting: Behavioral Health

## 2022-05-27 DIAGNOSIS — F411 Generalized anxiety disorder: Secondary | ICD-10-CM

## 2022-05-27 DIAGNOSIS — F319 Bipolar disorder, unspecified: Secondary | ICD-10-CM

## 2022-06-11 DIAGNOSIS — G4733 Obstructive sleep apnea (adult) (pediatric): Secondary | ICD-10-CM | POA: Diagnosis not present

## 2022-06-17 ENCOUNTER — Other Ambulatory Visit: Payer: Self-pay | Admitting: Behavioral Health

## 2022-06-17 DIAGNOSIS — F411 Generalized anxiety disorder: Secondary | ICD-10-CM

## 2022-06-17 DIAGNOSIS — F3132 Bipolar disorder, current episode depressed, moderate: Secondary | ICD-10-CM

## 2022-06-18 ENCOUNTER — Other Ambulatory Visit: Payer: Self-pay | Admitting: Family Medicine

## 2022-06-26 DIAGNOSIS — G4733 Obstructive sleep apnea (adult) (pediatric): Secondary | ICD-10-CM | POA: Diagnosis not present

## 2022-07-03 ENCOUNTER — Other Ambulatory Visit: Payer: Self-pay | Admitting: Family Medicine

## 2022-07-03 DIAGNOSIS — E785 Hyperlipidemia, unspecified: Secondary | ICD-10-CM

## 2022-07-10 NOTE — Telephone Encounter (Signed)
If this wasn't followed up on while I was out, can someone please address this? Thank you!

## 2022-07-10 NOTE — Telephone Encounter (Signed)
Was this ever followed up on while I was out?

## 2022-07-11 DIAGNOSIS — G4733 Obstructive sleep apnea (adult) (pediatric): Secondary | ICD-10-CM | POA: Diagnosis not present

## 2022-07-11 NOTE — Telephone Encounter (Signed)
Pt has upcoming apt 08/07/2022. Will wait and address the letter upon his next appointment. This will allow [place] to assess and discuss further to see if this should be a permanent problem going forward.

## 2022-07-11 NOTE — Telephone Encounter (Signed)
No worries! I just didn't want to duplicate something if it was already done. I will send to my POD to follow up on this. Thank you!   POD 3, can you please see if patient still needs this letter? Thank you!

## 2022-07-26 DIAGNOSIS — G4733 Obstructive sleep apnea (adult) (pediatric): Secondary | ICD-10-CM | POA: Diagnosis not present

## 2022-07-31 ENCOUNTER — Encounter: Payer: Self-pay | Admitting: Cardiovascular Disease

## 2022-08-07 ENCOUNTER — Ambulatory Visit: Payer: BC Managed Care – PPO | Admitting: Neurology

## 2022-08-07 ENCOUNTER — Encounter: Payer: Self-pay | Admitting: Neurology

## 2022-08-07 VITALS — BP 102/68 | HR 70 | Ht 70.0 in | Wt 200.0 lb

## 2022-08-07 DIAGNOSIS — I480 Paroxysmal atrial fibrillation: Secondary | ICD-10-CM | POA: Diagnosis not present

## 2022-08-07 DIAGNOSIS — G4733 Obstructive sleep apnea (adult) (pediatric): Secondary | ICD-10-CM

## 2022-08-07 DIAGNOSIS — G40209 Localization-related (focal) (partial) symptomatic epilepsy and epileptic syndromes with complex partial seizures, not intractable, without status epilepticus: Secondary | ICD-10-CM | POA: Diagnosis not present

## 2022-08-07 DIAGNOSIS — I613 Nontraumatic intracerebral hemorrhage in brain stem: Secondary | ICD-10-CM | POA: Diagnosis not present

## 2022-08-07 DIAGNOSIS — D1802 Hemangioma of intracranial structures: Secondary | ICD-10-CM | POA: Diagnosis not present

## 2022-08-07 MED ORDER — ZALEPLON 5 MG PO CAPS: 5.0000 mg | ORAL_CAPSULE | Freq: Every evening | ORAL | 0 refills | Status: DC | PRN

## 2022-08-07 NOTE — Patient Instructions (Signed)
Left Atrial Appendage Closure Device Implantation, Care After After a left atrial appendage closure device implantation, it is common to have: Some pain, swelling, soreness, and bruising around where the soft tube (catheter) was inserted. Tiredness (fatigue). Follow these instructions at home: Medicines Take over-the-counter and prescription medicines only as told by your health care provider. If you were prescribed antibiotics, take them as told by your provider. Do not stop taking the antibiotic even if you start to feel better. If you were prescribed medicines to prevent blood clots, take them as told by your provider. Insertion site care  Follow instructions from your provider about how to take care of your catheter insertion site. Make sure you: Wash your hands with soap and water for at least 20 seconds before and after you change your bandage (dressing). If soap and water are not available, use hand sanitizer. Change your dressing as told by your provider. Leave stitches (sutures), skin glue, or tape strips in place. These skin closures may need to stay in place for 2 weeks or longer. If tape strip edges start to loosen and curl up, you may trim the loose edges. Do not remove tape strips completely unless your provider tells you to do that. Check your insertion site every day for signs of infection. Check for: More redness, swelling, or pain. Fluid or blood. Warmth. Pus or a bad smell. A lump or bump that may develop. Activity Avoid activities that take a lot of effort (are strenuous). Ask your provider what activities are safe for you. You may have to avoid lifting. Ask your provider how much you can safely lift. Avoid sexual activity until your provider says that it is safe. Do not drive for the time you are told if you were given a medicine to help you relax (sedative) during the procedure. Ask your provider when it is safe for you to drive. Lifestyle Do not use any products that  contain nicotine or tobacco. These products include cigarettes, chewing tobacco, and vaping devices, such as e-cigarettes. If you need help quitting, ask your provider. If you drink alcohol: Limit how much you have to: 0-1 drink if you are male. 0-2 drinks if you are male. Know how much alcohol is in your drink. In the [place] one drink is one 12 oz bottle of beer (355 mL), one 5 oz glass of wine (148 mL), or one 1 oz glass of hard liquor (44 mL). General instructions Do not take baths, swim, shower, or use a hot tub until your provider approves. You may only be allowed to take sponge baths. Follow instructions from your provider about what you may eat and drink. You may need to follow a diet that is low in salt (sodium) and low in fat to prevent heart problems. Keep all follow-up visits. Your provider will want to talk to you about your care plan after the procedure. Contact a health care provider if: You have severe pain that does not get better with medicine. You have signs of infection at the insertion site. You have a fever. You have nausea and vomiting. Get help right away if:  The insertion site is bleeding, and it does not stop when you put constant pressure on the area. You have chest pain. You have trouble breathing. You have dizziness or you faint. You have any symptoms of a stroke. "BE FAST" is an easy way to remember the main warning signs of a stroke: B - Balance. Signs are dizziness, sudden trouble walking,  or loss of balance. E - Eyes. Signs are trouble seeing or a sudden change in vision. F - Face. Signs are sudden weakness or numbness of the face, or the face or eyelid drooping on one side. A - Arms. Signs are weakness or numbness in an arm. This happens suddenly and usually on one side of the body. S - Speech. Signs are sudden trouble speaking, slurred speech, or trouble understanding what people say. T - Time. Time to call emergency services. Write down what time  symptoms started. You have other signs of a stroke, such as: A sudden, severe headache with no known cause. Nausea or vomiting. Seizure. These symptoms may be an emergency. Get help right away. Call 911. Do not wait to see if the symptoms will go away. Do not drive yourself to the hospital. This information is not intended to replace advice given to you by your health care provider. Make sure you discuss any questions you have with your health care provider. Document Revised: 07/27/2021 Document Reviewed: 07/27/2021 Elsevier Patient Education  2024 [employer] Atrial Fibrillation Atrial fibrillation (AFib) is a type of irregular or rapid heartbeat (arrhythmia). In AFib, the top part of the heart (atria) beats in an irregular pattern. This makes the heart unable to pump blood normally and effectively. The goal of treatment is to prevent blood clots from forming, control your heart rate, or restore your heartbeat to a normal rhythm. If this condition is not treated, it can cause serious problems, such as a weakened heart muscle (cardiomyopathy) or a stroke. What are the causes? This condition is often caused by medical conditions that damage the heart's electrical system. These include: High blood pressure (hypertension). This is the most common cause. Certain heart problems or conditions, such as heart failure, coronary artery disease, heart valve problems, or heart surgery. Diabetes. Overactive thyroid (hyperthyroidism). Chronic kidney disease. Certain lung conditions, such as emphysema, pneumonia, or COPD. Obstructive sleep apnea. In some cases, the cause of this condition is not known. What increases the risk? This condition is more likely to develop in: Older adults. Athletes who do endurance exercise. People who have a family history of AFib. Males. People who are Caucasian. People who are obese. People who smoke or misuse alcohol. What are the signs or symptoms? Symptoms of  this condition include: Fast or irregular heartbeats (palpitations). Discomfort or pain in your chest. Shortness of breath. Sudden light-headedness or weakness. Tiring easily during exercise or activity. Syncope (fainting). Sweating. In some cases, there are no symptoms. How is this diagnosed? Your health care provider may detect AFib when taking your pulse. If detected, this condition may be diagnosed with: An electrocardiogram (ECG) to check electrical signals of the heart. An ambulatory cardiac monitor to record your heart's activity for a few days. A transthoracic echocardiogram (TTE) to create pictures of your heart. A transesophageal echocardiogram (TEE) to create even clearer pictures of your heart. A stress test to check your blood supply while you exercise. Imaging tests, such as a CT scan or chest X-ray. Blood tests. How is this treated? Treatment depends on underlying conditions and how you feel when you get AFib. This condition may be treated with: Medicines to prevent blood clots or to treat heart rate or heart rhythm problems. Electrical cardioversion to reset the heart's rhythm. A pacemaker to correct abnormal heart rhythm. Ablation to remove the heart tissue that sends abnormal signals. Left atrial appendage closure to seal the area where blood clots can form. In some  cases, underlying conditions will be treated. Follow these instructions at home: Medicines Take over-the counter and prescription medicines only as told by your provider. Do not take any new medicines without talking to your provider. If you are taking blood thinners: Talk with your provider before taking aspirin or NSAIDs. These medicines can raise your risk of bleeding. Take your medicines as told. Take them at the same time each day. Do not do things that could hurt or bruise you. Be careful to avoid falls. Wear an alert bracelet or carry a card that says that you take blood thinners. Lifestyle Do  not use any products that contain nicotine or tobacco. These products include cigarettes, chewing tobacco, and vaping devices, such as e-cigarettes. If you need help quitting, ask your provider. Eat heart-healthy foods. Talk with a food expert (dietitian) to make an eating plan that is right for you. Exercise regularly as told by your provider. Do not drink alcohol. Lose weight if you are overweight. General instructions If you have obstructive sleep apnea, manage your condition as told by your provider. Do not use diet pills unless your provider approves. Diet pills can make heart problems worse. Keep all follow-up visits. Your provider will want to check your heart rate and rhythm regularly. Contact a health care provider if: You notice a change in the rate, rhythm, or strength of your heartbeat. You are taking a blood thinner and you notice more bruising. You tire more easily when you exercise or do heavy work. You have a sudden change in weight. Get help right away if:  You have chest pain. You have trouble breathing. You have side effects of blood thinners, such as blood in your vomit, poop (stool), or pee (urine), or bleeding that does not stop. You have any symptoms of a stroke. "BE FAST" is an easy way to remember the main warning signs of a stroke: B - Balance. Signs are dizziness, sudden trouble walking, or loss of balance. E - Eyes. Signs are trouble seeing or a sudden change in vision. F - Face. Signs are sudden weakness or numbness of the face, or the face or eyelid drooping on one side. A - Arms. Signs are weakness or numbness in an arm. This happens suddenly and usually on one side of the body. S - Speech.Signs are sudden trouble speaking, slurred speech, or trouble understanding what people say. T - Time. Time to call emergency services. Write down what time symptoms started. Other signs of a stroke, such as: A sudden, severe headache with no known cause. Nausea or  vomiting. Seizure. These symptoms may be an emergency. Get help right away. Call 911. Do not wait to see if the symptoms will go away. Do not drive yourself to the hospital. This information is not intended to replace advice given to you by your health care provider. Make sure you discuss any questions you have with your health care provider. Document Revised: 09/07/2021 Document Reviewed: 09/07/2021 Elsevier Patient Education  2024 [employer].

## 2022-08-07 NOTE — Progress Notes (Signed)
Provider:  Melvyn Novas, MD  Primary Care Physician:  Ardith Dark, MD [address] [place] [address]     Referring Provider: Ardith Dark, Md [address] [place],  [place] [address]          Chief Complaint according to patient   Patient presents with:     New Patient (Initial Visit)           HISTORY OF PRESENT ILLNESS:  Todd Harris is a 62 y.o. male patient who is here for a visit  08/07/2022 for  follow up on sleep apnea study and just got a new CPAP. I had seen him last in 2020.  PCP is  Jacquiline Doe, MD/ primary neurologist : sees Dr Lucia Gaskins, MD, since12-22-2016.  Has been seen by NP McCue in February 2024 who oreder HST for baseline and new machine.    CLINICAL INFORMATION/HISTORY:  Cavernous Hemangiomata, Has had a right pontine ICH secondary to cavernous hemangioma in 09/2016 s/p gamma knife with residual left sided sensory impairment, ataxia and vertigo and hx of seizures on lamotrigine.  Numbness on his left ever since. No speech changes . Right hand dominant .    Repeat MRI brain 09/2020 for worsening vertigo showed stable appearance of cerebral cavernous angioma, worsening vertigo improved after PT. Complaints of dizziness that still remains. DME [facility] states machine is working well , set up 12/2015.   Continues to do well with CPAP.  Review of compliance report over the past 30 days shows 30 out of 30 usage days with 30 days greater than 4 hours for 100% compliance. Average usage 8 hours and 50 minutes.  Residual AHI 0.4 on set pressure of 13.  Leaks in the 95th percentile 18.1.  Continues to tolerate well.    Routinely follows with DME company [facility]. He is due for a new machine, interested in starting this process. Of note, he has since been diagnosed with atrial fibrillation since prior visit.  He had another spell just this July 2024.   His home sleep test was dated 4-10 2024 and ordered by Derwood Kaplan, NP, he had a  calculated AHI of 27.1/h with a REM sleep and non-REM sleep equal amount of apneas there was just a slight predisposition to apnea on the right versus the left and in supine sleep he had the highest AHI of 35.6/h.  On the left the lowest 18.3/h.  He did not have significant snoring only 13% of the total sleep time were associated with snoring and the mean volume was 41 dB Heart rate varied between 49 and 98 bpm.  Unfortunately adult home sleep test cannot give [place] ideas about cardiac rhythm at the patient had just been diagnosed with atrial fibrillation.  He was only 2.2 minutes under 89% oxygen saturation.   I had ordered the autotitration device between 5 and 16 cm water with 3 cm EPR and he has been highly compliant also he traveled from the 14th to the 26 and used his older machine.  His combined usage is 100%.   His average usage is 9 hours 13 minutes, The setting is escorted above between 5 and 16 cm water with 3 cm expiratory pressure relief his residual AHI is 0.7/h.  The 95th percentile pressure is 8.8 cmH2O this is an excellent resolution he has literally no air leak so I would like to continue therapy without changing settings or pressures.  He  endorsed the fatigue severity scale at 32 points at the Epworth Sleepiness Scale at 7 points.  Geriatric depression score was endorsed at 4 out of 15 points  His medication was reviewed a lot of his medication is as needed.    He does have a prescription for zaleplon also known as Zoloft to 5 mg if he has protracted insomnia.  He is currently on Ozempic and he has lost weight in comparison to his previous visits , lost 33 pounds.        Review of Systems: Out of a complete 14 system review, the patient complains of only the following symptoms, and all other reviewed systems are negative.:  Fatigue, sleepiness , snoring, fragmented sleep, Insomnia, RLS, Nocturia.   How likely are you to doze in the following situations: 0 = not likely, 1 = slight  chance, 2 = moderate chance, 3 = high chance   Sitting and Reading? Watching Television? Sitting inactive in a public place (theater or meeting)? As a passenger in a car for an hour without a break? Lying down in the afternoon when circumstances permit? Sitting and talking to someone? Sitting quietly after lunch without alcohol? In a car, while stopped for a few minutes in traffic?   He endorsed the fatigue severity scale at 32 points at the Epworth Sleepiness Scale at 7 points.  Geriatric depression score was endorsed at 4 out of 15 points.  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    Married. 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      Caffeine use: 2 cup coffee per day   Social Determinants of Health   Financial Resource Strain: Not on file  Food Insecurity: Not on file  Transportation Needs: Not on file  Physical Activity: Not on file  Stress: Not on file  Social Connections: Not on file    Family History  Problem Relation Age of Onset   Anxiety disorder Mother    Depression Mother    Bipolar disorder Mother    Asthma Mother    Diabetes Mother    Hypertension Mother    Pancreatic cancer Father    Diabetes Father     Cancer Father        Pancreatic   Diabetes Brother    Diabetes Brother    Bipolar disorder Maternal Grandmother    Aneurysm Maternal Grandmother    Diabetes Paternal Grandmother    Diabetes Paternal Grandfather    Colon cancer Neg Hx    Colon polyps Neg Hx    Stomach cancer Neg Hx  Rectal cancer Neg Hx    Esophageal cancer Neg Hx     Past Medical History:  Diagnosis Date   Anxiety    Atrial arrhythmia    Barrett esophagus    Bipolar 1 disorder (HCC)    Cavernous angioma    Chronic kidney disease    Depression    Diabetes mellitus without complication (HCC)    GERD (gastroesophageal reflux disease)    History of chicken pox    Hypertension    Seizures (HCC)    1 in 2005, 1 in 2016    Sleep apnea    Stroke (HCC) 11/2014   No residual deficits   Venous malformation    Brain    Past Surgical History:  Procedure Laterality Date   APPENDECTOMY  1970   age 89   bone spur     age 12   kidney stones     RAD ONC MR BRAIN GAMMA KNIFE  12/05/2016   TONSILLECTOMY  1968   UMBILICAL HERNIA REPAIR N/A 07/24/2018   Procedure: HERNIA REPAIR UMBILICAL ADULT;  Surgeon: Almond Lint, MD;  Location: [facility];  Service: General;  Laterality: N/A;     Current Outpatient Medications on File Prior to Visit  Medication Sig Dispense Refill   atorvastatin (LIPITOR) 40 MG tablet TAKE 1 TABLET BY MOUTH EVERY DAY 90 tablet 1   buPROPion (WELLBUTRIN XL) 300 MG 24 hr tablet TAKE 1 TABLET BY MOUTH EVERY DAY 90 tablet 0   cariprazine (VRAYLAR) 3 MG capsule TAKE 1 CAPSULE BY MOUTH DAILY. 90 capsule 0   diazepam (VALIUM) 5 MG tablet TAKE 1 TABLET BY MOUTH TWICE A DAY 180 tablet 2   icosapent Ethyl (VASCEPA) 1 g capsule TAKE 2 CAPSULES BY MOUTH 2 TIMES DAILY. 360 capsule 3   ipratropium (ATROVENT) 0.03 % nasal spray INSTILL 2 SPRAYS INTO BOTH NOSTRILS EVERY 12 HOURS. 30 mL 1   lamoTRIgine (LAMICTAL) 100 MG tablet Take 1 tablet (100 mg total) by mouth 2 (two) times daily. 180 tablet 3    lisinopril-hydrochlorothiazide (ZESTORETIC) 20-25 MG tablet TAKE 1 TABLET BY MOUTH EVERY DAY 90 tablet 1   propranolol (INDERAL) 10 MG tablet TAKE 1 TABLET BY MOUTH UP TO FOUR TIMES DAILY AS NEEDED FOR PALPITATIONS/SVT 360 tablet 2   Semaglutide,0.25 or 0.5MG /DOS, (OZEMPIC, 0.25 OR 0.5 MG/DOSE,) 2 MG/3ML SOPN INJECT 0.5 MG INTO THE SKIN ONCE A WEEK. INJECT 0.5 MG INTO THE SKIN ONCE WEEKLY 3 mL 1   No current facility-administered medications on file prior to visit.    No Known Allergies   DIAGNOSTIC DATA (LABS, IMAGING, TESTING) - I reviewed patient records, labs, notes, testing and imaging myself where available.  Lab Results  Component Value Date   WBC 11.2 (H) 09/24/2021   HGB 15.3 09/24/2021   HCT 44.0 09/24/2021   MCV 87.5 09/24/2021   PLT 204 09/24/2021      Component Value Date/Time   NA 140 03/13/2022 1350   NA 142 03/08/2022 0849   K 4.3 03/13/2022 1350   CL 99 03/13/2022 1350   CO2 33 (H) 03/13/2022 1350   GLUCOSE 67 (L) 03/13/2022 1350   BUN 21 03/13/2022 1350   BUN 22 03/08/2022 0849   CREATININE 0.94 03/13/2022 1350   CREATININE 0.99 05/02/2019 1536   CALCIUM 10.3 03/13/2022 1350   PROT 6.6 03/13/2022 1350   PROT 6.6 03/08/2022 0849   ALBUMIN 4.6 03/13/2022 1350   ALBUMIN 4.8 03/08/2022 0849   AST 19 03/13/2022 1350  ALT 78 (H) 03/13/2022 1350   ALKPHOS 63 03/13/2022 1350   BILITOT 0.5 03/13/2022 1350   BILITOT 0.5 03/08/2022 0849   GFRNONAA >60 09/24/2021 2151   GFRAA >60 07/25/2018 0359   Lab Results  Component Value Date   CHOL 136 06/07/2021   HDL 42.10 06/07/2021   LDLCALC 55 06/07/2021   LDLDIRECT 58.0 07/04/2019   TRIG 192.0 (H) 06/07/2021   CHOLHDL 3 06/07/2021   Lab Results  Component Value Date   HGBA1C 5.6 03/09/2022   No results found for: "VITAMINB12" Lab Results  Component Value Date   TSH 0.98 06/07/2021    PHYSICAL EXAM:  Today's Vitals   08/07/22 1524  BP: 102/68  Pulse: 70  Weight: 200 lb (90.7 kg)  Height: 5\' 10"   (1.778 m)   Body mass index is 28.7 kg/m.   Wt Readings from Last 3 Encounters:  08/07/22 200 lb (90.7 kg)  03/09/22 198 lb 12.8 oz (90.2 kg)  03/08/22 200 lb (90.7 kg)     Ht Readings from Last 3 Encounters:  08/07/22 5\' 10"  (1.778 m)  03/09/22 5' 9.5" (1.765 m)  03/08/22 5' 9.5" (1.765 m)      General: The patient is awake, alert and appears not in acute distress. The patient is well groomed. Head: Normocephalic, atraumatic. Neck is supple.  Mallampati 2,  neck circumference:17 inches  . Nasal airflow  patent.   Retrognathia is not seen.  Dental status: biological  Cardiovascular:  Regular rate and cardiac rhythm by pulse,  without distended neck veins. Respiratory: Lungs are clear to auscultation.  Skin:  Without evidence of ankle edema, or rash. Trunk: The patient's posture is erect.   NEUROLOGIC EXAM: The patient is awake and alert, oriented to place and time.   Memory subjective described as intact.  Attention span & concentration ability appears normal.  Speech is fluent,  without  dysarthria, dysphonia or aphasia.  Mood and affect are appropriate.   Cranial nerves: no loss of smell or taste reported  Pupils are equal and briskly reactive to light. Funduscopic exam deferred. .  Extraocular movements in vertical and horizontal planes were intact and without nystagmus. No Diplopia. Visual fields by finger perimetry are intact. Hearing was intact to soft voice and finger rubbing.    Facial sensation intact to fine touch.  Facial motor strength is symmetric and tongue and uvula move midline.  Neck ROM : rotation, tilt and flexion extension were normal for age and shoulder shrug was symmetrical.    Motor exam:  Symmetric bulk, tone and ROM.   Normal tone without cog wheeling, symmetric grip strength .   Sensory:  left sided slight numbness ever since his pontine bleed.     ASSESSMENT AND PLAN:   Dear Ihor Austin and Dr Naomie Dean, MD :  62 y.o. year old  male patient is here with:    1) OSA / new CPAP after HST confirmed still present moderate sleep apnea.   He is compliant and will continue using CPAP as issued.   2) new onset atrial fib, paroxysmal. Cardiologist is considering watchman.   3) reduction of caffeine and alcohol use.  Sleep services plan to follow up through  NP within 12 months.   I would like to thank Ardith Dark, MD  for allowing me to meet with and to take care of this pleasant patient.    After spending a total time of 45  minutes face to face and additional time for  physical and neurologic examination, review of laboratory studies,  personal review of imaging studies, reports and results of other testing and review of referral information / records as far as provided in visit,   Electronically signed by: Melvyn Novas, MD 08/07/2022 3:59 PM  [facility] Board certified by The [employer] of Sleep Medicine and Diplomate of the [employer] of Sleep Medicine. Board certified In Neurology through the ABPN, Fellow of the [employer] of Neurology.

## 2022-08-11 DIAGNOSIS — G4733 Obstructive sleep apnea (adult) (pediatric): Secondary | ICD-10-CM | POA: Diagnosis not present

## 2022-08-23 ENCOUNTER — Other Ambulatory Visit: Payer: Self-pay | Admitting: Behavioral Health

## 2022-08-23 ENCOUNTER — Other Ambulatory Visit: Payer: Self-pay | Admitting: Family Medicine

## 2022-08-23 DIAGNOSIS — F411 Generalized anxiety disorder: Secondary | ICD-10-CM

## 2022-08-23 DIAGNOSIS — F319 Bipolar disorder, unspecified: Secondary | ICD-10-CM

## 2022-08-26 DIAGNOSIS — G4733 Obstructive sleep apnea (adult) (pediatric): Secondary | ICD-10-CM | POA: Diagnosis not present

## 2022-09-12 ENCOUNTER — Other Ambulatory Visit: Payer: Self-pay | Admitting: Family Medicine

## 2022-09-12 DIAGNOSIS — I10 Essential (primary) hypertension: Secondary | ICD-10-CM

## 2022-09-13 ENCOUNTER — Other Ambulatory Visit: Payer: Self-pay | Admitting: Behavioral Health

## 2022-09-13 DIAGNOSIS — F411 Generalized anxiety disorder: Secondary | ICD-10-CM

## 2022-09-13 DIAGNOSIS — F3132 Bipolar disorder, current episode depressed, moderate: Secondary | ICD-10-CM

## 2022-09-13 DIAGNOSIS — G4733 Obstructive sleep apnea (adult) (pediatric): Secondary | ICD-10-CM | POA: Diagnosis not present

## 2022-09-29 ENCOUNTER — Other Ambulatory Visit: Payer: Self-pay | Admitting: Family Medicine

## 2022-10-13 DIAGNOSIS — G4733 Obstructive sleep apnea (adult) (pediatric): Secondary | ICD-10-CM | POA: Diagnosis not present

## 2022-10-16 ENCOUNTER — Encounter: Payer: Self-pay | Admitting: Cardiovascular Disease

## 2022-10-17 ENCOUNTER — Other Ambulatory Visit: Payer: Self-pay | Admitting: Family Medicine

## 2022-10-20 ENCOUNTER — Encounter: Payer: Self-pay | Admitting: Behavioral Health

## 2022-10-20 ENCOUNTER — Ambulatory Visit: Payer: BC Managed Care – PPO | Admitting: Behavioral Health

## 2022-10-20 DIAGNOSIS — F319 Bipolar disorder, unspecified: Secondary | ICD-10-CM | POA: Diagnosis not present

## 2022-10-20 DIAGNOSIS — G40209 Localization-related (focal) (partial) symptomatic epilepsy and epileptic syndromes with complex partial seizures, not intractable, without status epilepticus: Secondary | ICD-10-CM | POA: Diagnosis not present

## 2022-10-20 DIAGNOSIS — F3132 Bipolar disorder, current episode depressed, moderate: Secondary | ICD-10-CM

## 2022-10-20 DIAGNOSIS — F411 Generalized anxiety disorder: Secondary | ICD-10-CM | POA: Diagnosis not present

## 2022-10-20 MED ORDER — CARIPRAZINE HCL 3 MG PO CAPS: 3.0000 mg | ORAL_CAPSULE | Freq: Every day | ORAL | 1 refills | Status: DC

## 2022-10-20 MED ORDER — BUPROPION HCL ER (XL) 300 MG PO TB24
ORAL_TABLET | ORAL | 1 refills | Status: DC
Start: 2022-10-20 — End: 2023-04-13

## 2022-10-20 MED ORDER — LAMOTRIGINE 100 MG PO TABS: 100.0000 mg | ORAL_TABLET | Freq: Two times a day (BID) | ORAL | 3 refills | Status: DC

## 2022-10-20 NOTE — Progress Notes (Signed)
[facility]  Patient ID: Todd Harris,  MRN: [id number]  PCP: Ardith Dark, MD  Date of Evaluation: 10/20/2022 Time spent:30 minutes  Chief Complaint:  Chief Complaint   Anxiety; Depression; Follow-up; Medication Refill; Patient Education     HISTORY/CURRENT STATUS: HPI 62year old male presents to this office for follow up and medication management. Continued great stability. Says he has been doing very well. Still traveling with his job. Says his anxiety and depression have been well controlled. No changes since last visit. No mania or depressive episodes since last visit. No changes to medication regimen are indicate at this time. Says he is not concerned too much about his mental health right now but has some things with his physical health to work on. He says he has had problems with A-fib and is following up with Cardiologist regularly.  He reports anxiety today 2/10 and depression 1/10. Says he sleeps 7 hours per night. Reports no mania. No psychosis. No SI/HI.      Individual Medical History/ Review of Systems: Changes? :No   Allergies: Patient has no known allergies.  Current Medications:  Current Outpatient Medications:    atorvastatin (LIPITOR) 40 MG tablet, TAKE 1 TABLET BY MOUTH EVERY DAY, Disp: 90 tablet, Rfl: 1   buPROPion (WELLBUTRIN XL) 300 MG 24 hr tablet, TAKE 1 TABLET BY MOUTH EVERY DAY, Disp: 90 tablet, Rfl: 1   cariprazine (VRAYLAR) 3 MG capsule, Take 1 capsule (3 mg total) by mouth daily., Disp: 90 capsule, Rfl: 1   diazepam (VALIUM) 5 MG tablet, TAKE 1 TABLET BY MOUTH TWICE A DAY, Disp: 180 tablet, Rfl: 2   icosapent Ethyl (VASCEPA) 1 g capsule, TAKE 2 CAPSULES BY MOUTH TWICE A DAY, Disp: 360 capsule, Rfl: 3   ipratropium (ATROVENT) 0.03 % nasal spray, INSTILL 2 SPRAYS INTO BOTH NOSTRILS EVERY 12 HOURS., Disp: 30 mL, Rfl: 1   lamoTRIgine (LAMICTAL) 100 MG tablet, Take 1 tablet (100 mg total) by mouth 2 (two) times daily., Disp: 180 tablet, Rfl:  3   lisinopril-hydrochlorothiazide (ZESTORETIC) 20-25 MG tablet, TAKE 1 TABLET BY MOUTH EVERY DAY, Disp: 90 tablet, Rfl: 1   propranolol (INDERAL) 10 MG tablet, TAKE 1 TABLET BY MOUTH UP TO FOUR TIMES DAILY AS NEEDED FOR PALPITATIONS/SVT, Disp: 360 tablet, Rfl: 2   Semaglutide,0.25 or 0.5MG /DOS, (OZEMPIC, 0.25 OR 0.5 MG/DOSE,) 2 MG/3ML SOPN, INJECT 0.5MG  DOSE INTO THE SKIN ONCE WEEKLY, Disp: 3 mL, Rfl: 1   zaleplon (SONATA) 5 MG capsule, Take 1 capsule (5 mg total) by mouth at bedtime as needed for sleep. for sleep, Disp: 30 capsule, Rfl: 0 Medication Side Effects: none  Family Medical/ Social History: Changes? No  MENTAL HEALTH EXAM:  There were no vitals taken for this visit.There is no height or weight on file to calculate BMI.  General Appearance: Casual, Neat, and Well Groomed  Eye Contact:  Good  Speech:  Clear and Coherent  Volume:  Normal  Mood:  Anxious and Depressed  Affect:  Appropriate  Thought Process:  Coherent  Orientation:  Full (Time, Place, and Person)  Thought Content: Logical   Suicidal Thoughts:  No  Homicidal Thoughts:  No  Memory:  WNL  Judgement:  Good  Insight:  Good  Psychomotor Activity:  Normal  Concentration:  Concentration: Good  Recall:  Good  Fund of Knowledge: Good  Language: Good  Assets:  Desire for Improvement  ADL's:  Intact  Cognition: WNL  Prognosis:  Good    DIAGNOSES:  ICD-10-CM   1. GAD (generalized anxiety disorder)  F41.1 buPROPion (WELLBUTRIN XL) 300 MG 24 hr tablet    cariprazine (VRAYLAR) 3 MG capsule    2. Bipolar I disorder, moderate, current or most recent episode depressed, with anxious distress (HCC)  F31.32 buPROPion (WELLBUTRIN XL) 300 MG 24 hr tablet    3. Partial symptomatic epilepsy with complex partial seizures, not intractable, without status epilepticus (HCC)  G40.209 lamoTRIgine (LAMICTAL) 100 MG tablet    4. Bipolar I disorder (HCC)  F31.9 cariprazine (VRAYLAR) 3 MG capsule      Receiving Psychotherapy:  No    RECOMMENDATIONS:  Continue Vraylar to 3 mg daily Continue Wellbutrin 300 mg XL daily Continue Valium  5 mg tablet twice daily Continue Lamictal 100 mg twice daily Follow up in 6 months per patient to reassess.  To report any worsening symptoms  or side effects promptly Monitor any increase in dizziness and notify if worsens Greater than 50% of 30 min face to face time with patient was spent on counseling and coordination of care. We discussed his current stability with bipolar disorder. He has not experienced any manic or depressive episodes since last visit.  Discussed potential benefits, risk, and side effects of benzodiazepines to include potential risk of tolerance and dependence, as well as possible drowsiness.  Advised patient not to drive if experiencing drowsiness and to take lowest possible effective dose to minimize risk of dependence and tolerance.  Patient will be traveling out of country extensively for job and request 12 month follow up.  Reviewed patients most recent labs and nothing of concern this visit.  PDMP reviewed.      Joan Flores, NP

## 2022-10-25 ENCOUNTER — Other Ambulatory Visit: Payer: Self-pay | Admitting: Family Medicine

## 2022-10-25 DIAGNOSIS — E785 Hyperlipidemia, unspecified: Secondary | ICD-10-CM

## 2022-11-13 DIAGNOSIS — G4733 Obstructive sleep apnea (adult) (pediatric): Secondary | ICD-10-CM | POA: Diagnosis not present

## 2022-11-15 ENCOUNTER — Encounter: Payer: Self-pay | Admitting: Psychiatry

## 2022-11-15 ENCOUNTER — Encounter: Payer: Self-pay | Admitting: Cardiovascular Disease

## 2022-11-21 ENCOUNTER — Encounter: Payer: Self-pay | Admitting: Cardiovascular Disease

## 2022-11-23 DIAGNOSIS — G4733 Obstructive sleep apnea (adult) (pediatric): Secondary | ICD-10-CM | POA: Diagnosis not present

## 2022-11-27 ENCOUNTER — Encounter: Payer: Self-pay | Admitting: Family Medicine

## 2022-11-27 ENCOUNTER — Ambulatory Visit (INDEPENDENT_AMBULATORY_CARE_PROVIDER_SITE_OTHER): Payer: BC Managed Care – PPO | Admitting: Family Medicine

## 2022-11-27 VITALS — BP 123/78 | HR 58 | Temp 97.7°F | Ht 70.0 in | Wt 211.2 lb

## 2022-11-27 DIAGNOSIS — E785 Hyperlipidemia, unspecified: Secondary | ICD-10-CM | POA: Diagnosis not present

## 2022-11-27 DIAGNOSIS — E559 Vitamin D deficiency, unspecified: Secondary | ICD-10-CM

## 2022-11-27 DIAGNOSIS — Z0001 Encounter for general adult medical examination with abnormal findings: Secondary | ICD-10-CM | POA: Diagnosis not present

## 2022-11-27 DIAGNOSIS — I10 Essential (primary) hypertension: Secondary | ICD-10-CM

## 2022-11-27 DIAGNOSIS — I480 Paroxysmal atrial fibrillation: Secondary | ICD-10-CM

## 2022-11-27 DIAGNOSIS — R7303 Prediabetes: Secondary | ICD-10-CM

## 2022-11-27 DIAGNOSIS — R569 Unspecified convulsions: Secondary | ICD-10-CM

## 2022-11-27 MED ORDER — SEMAGLUTIDE (1 MG/DOSE) 4 MG/3ML ~~LOC~~ SOPN: 1.0000 mg | PEN_INJECTOR | SUBCUTANEOUS | 0 refills | Status: DC

## 2022-11-27 NOTE — Patient Instructions (Signed)
It was very nice to see you today!  We will check labs.  I will increase your Ozempic to 1 mg weekly.  Please continue to work on diet and exercise.  Return in about 6 months (around 05/27/2023) for Follow Up.   Take care, Dr Jimmey Ralph  PLEASE NOTE:  If you had any lab tests, please let us know if you have not heard back within a few days. You may see your results on mychart before we have a chance to review them but we will give you a call once they are reviewed by [place].   If we ordered any referrals today, please let us know if you have not heard from their office within the next week.   If you had any urgent prescriptions sent in today, please check with the pharmacy within an hour of our visit to make sure the prescription was transmitted appropriately.   Please try these tips to maintain a healthy lifestyle:  Eat at least 3 REAL meals and 1-2 snacks per day.  Aim for no more than 5 hours between eating.  If you eat breakfast, please do so within one hour of getting up.   Each meal should contain half fruits/vegetables, one quarter protein, and one quarter carbs (no bigger than a computer mouse)  Cut down on sweet beverages. This includes juice, soda, and sweet tea.   Drink at least 1 glass of water with each meal and aim for at least 8 glasses per day  Exercise at least 150 minutes every week.    Preventive Care 38-61 Years Old, Male Preventive care refers to lifestyle choices and visits with your health care provider that can promote health and wellness. Preventive care visits are also called wellness exams. What can I expect for my preventive care visit? Counseling During your preventive care visit, your health care provider may ask about your: Medical history, including: Past medical problems. Family medical history. Current health, including: Emotional well-being. Home life and relationship well-being. Sexual activity. Lifestyle, including: Alcohol, nicotine or tobacco,  and drug use. Access to firearms. Diet, exercise, and sleep habits. Safety issues such as seatbelt and bike helmet use. Sunscreen use. Work and work Astronomer. Physical exam Your health care provider will check your: Height and weight. These may be used to calculate your BMI (body mass index). BMI is a measurement that tells if you are at a healthy weight. Waist circumference. This measures the distance around your waistline. This measurement also tells if you are at a healthy weight and may help predict your risk of certain diseases, such as type 2 diabetes and high blood pressure. Heart rate and blood pressure. Body temperature. Skin for abnormal spots. What immunizations do I need?  Vaccines are usually given at various ages, according to a schedule. Your health care provider will recommend vaccines for you based on your age, medical history, and lifestyle or other factors, such as travel or where you work. What tests do I need? Screening Your health care provider may recommend screening tests for certain conditions. This may include: Lipid and cholesterol levels. Diabetes screening. This is done by checking your blood sugar (glucose) after you have not eaten for a while (fasting). Hepatitis B test. Hepatitis C test. HIV (human immunodeficiency virus) test. STI (sexually transmitted infection) testing, if you are at risk. Lung cancer screening. Prostate cancer screening. Colorectal cancer screening. Talk with your health care provider about your test results, treatment options, and if necessary, the need for more  tests. Follow these instructions at home: Eating and drinking  Eat a diet that includes fresh fruits and vegetables, whole grains, lean protein, and low-fat dairy products. Take vitamin and mineral supplements as recommended by your health care provider. Do not drink alcohol if your health care provider tells you not to drink. If you drink alcohol: Limit how much you  have to 0-2 drinks a day. Know how much alcohol is in your drink. In the [place] one drink equals one 12 oz bottle of beer (355 mL), one 5 oz glass of wine (148 mL), or one 1 oz glass of hard liquor (44 mL). Lifestyle Brush your teeth every morning and night with fluoride toothpaste. Floss one time each day. Exercise for at least 30 minutes 5 or more days each week. Do not use any products that contain nicotine or tobacco. These products include cigarettes, chewing tobacco, and vaping devices, such as e-cigarettes. If you need help quitting, ask your health care provider. Do not use drugs. If you are sexually active, practice safe sex. Use a condom or other form of protection to prevent STIs. Take aspirin only as told by your health care provider. Make sure that you understand how much to take and what form to take. Work with your health care provider to find out whether it is safe and beneficial for you to take aspirin daily. Find healthy ways to manage stress, such as: Meditation, yoga, or listening to music. Journaling. Talking to a trusted person. Spending time with friends and family. Minimize exposure to UV radiation to reduce your risk of skin cancer. Safety Always wear your seat belt while driving or riding in a vehicle. Do not drive: If you have been drinking alcohol. Do not ride with someone who has been drinking. When you are tired or distracted. While texting. If you have been using any mind-altering substances or drugs. Wear a helmet and other protective equipment during sports activities. If you have firearms in your house, make sure you follow all gun safety procedures. What's next? Go to your health care provider once a year for an annual wellness visit. Ask your health care provider how often you should have your eyes and teeth checked. Stay up to date on all vaccines. This information is not intended to replace advice given to you by your health care provider. Make sure  you discuss any questions you have with your health care provider. Document Revised: 06/16/2020 Document Reviewed: 06/16/2020 Elsevier Patient Education  2024 [employer].

## 2022-11-27 NOTE — Addendum Note (Signed)
Addended by: Lorn Junes on: 11/27/2022 03:05 PM   Modules accepted: Orders

## 2022-11-27 NOTE — Assessment & Plan Note (Signed)
Following with neurology.  He has on Lamictal 100 mg twice daily.

## 2022-11-27 NOTE — Assessment & Plan Note (Signed)
Check vitamin D. 

## 2022-11-27 NOTE — Assessment & Plan Note (Signed)
Regular rate and rhythm today.  Continue management per cardiology.  Takes propranolol as needed.

## 2022-11-27 NOTE — Assessment & Plan Note (Signed)
Blood pressure at goal on lisinopril-HCTZ 20-25 once daily.

## 2022-11-27 NOTE — Assessment & Plan Note (Signed)
Discussed lifestyle modifications.  He is working on diet and exercise.  Will check lipids today.  He has on Vascepa 2 g twice daily and Lipitor 40 mg daily.

## 2022-11-27 NOTE — Assessment & Plan Note (Signed)
Check A1c.  He is working on lifestyle modifications.  He has gained a few pounds since her last visit and he is interested in increasing his dose of Ozempic.  Will increase to 1 mg weekly.  We discussed potential side effects.  He will follow-up with [place] in a few weeks via MyChart.  Follow-up for in person visit in 3 to 6 months depending on results of today's A1c.

## 2022-11-27 NOTE — Progress Notes (Signed)
Chief Complaint:  Todd Harris is a 62 y.o. male who presents today for his annual comprehensive physical exam.    Assessment/Plan:  Chronic Problems Addressed Today: HTN (hypertension) Blood pressure at goal on lisinopril HCTZ 20-25 once daily.  Seizures (HCC) Following with neurology.  He has on Lamictal 100 mg twice daily.  Vitamin D insufficiency Check vitamin D.  Dyslipidemia Discussed lifestyle modifications.  He is working on diet and exercise.  Will check lipids today.  He has on Vascepa 2 g twice daily and Lipitor 40 mg daily.  Prediabetes Check A1c.  He is working on lifestyle modifications.  He has gained a few pounds since her last visit and he is interested in increasing his dose of Ozempic.  Will increase to 1 mg weekly.  We discussed potential side effects.  He will follow-up with [place] in a few weeks via MyChart.  Follow-up for in person visit in 3 to 6 months depending on results of today's A1c.  Atrial fibrillation (HCC) Regular rate and rhythm today.  Continue management per cardiology.  Takes propranolol as needed.  Preventative Healthcare: Check labs. His due for second shingles vaccine - however he thinks he may have had this done at the pharmacy already.  He will check with them first.  We can give at next office visit if needed.  Due for colonoscopy 2030.  Up-to-date on other vaccines.  Patient Counseling(The following topics were reviewed and/or handout was given):  -Nutrition: Stressed importance of moderation in sodium/caffeine intake, saturated fat and cholesterol, caloric balance, sufficient intake of fresh fruits, vegetables, and fiber.  -Stressed the importance of regular exercise.   -Substance Abuse: Discussed cessation/primary prevention of tobacco, alcohol, or other drug use; driving or other dangerous activities under the influence; availability of treatment for abuse.   -Injury prevention: Discussed safety belts, safety helmets, smoke detector,  smoking near bedding or upholstery.   -Sexuality: Discussed sexually transmitted diseases, partner selection, use of condoms, avoidance of unintended pregnancy and contraceptive alternatives.   -Dental health: Discussed importance of regular tooth brushing, flossing, and dental visits.  -Health maintenance and immunizations reviewed. Please refer to Health maintenance section.  Return to care in 1 year for next preventative visit.     Subjective:  HPI:  He has no acute complaints today. See Assessment / plan for status of chronic conditions.   Lifestyle Diet: Balanced. Plenty of fruits and vegetables.  Exercise: Tries to work on foot peddles.      11/27/2022    1:50 PM  Depression screen PHQ 2/9  Decreased Interest 0  Down, Depressed, Hopeless 0  PHQ - 2 Score 0  Altered sleeping 1  Tired, decreased energy 1  Change in appetite 1  Feeling bad or failure about yourself  0  Trouble concentrating 0  Moving slowly or fidgety/restless 0  Suicidal thoughts 0  PHQ-9 Score 3  Difficult doing work/chores Somewhat difficult    There are no preventive care reminders to display for this patient.   ROS: Per HPI, otherwise a complete review of systems was negative.   PMH:  The following were reviewed and entered/updated in epic: Past Medical History:  Diagnosis Date   Anxiety    Atrial arrhythmia    Barrett esophagus    Bipolar 1 disorder (HCC)    Cavernous angioma    Chronic kidney disease    Depression    Diabetes mellitus without complication (HCC)    GERD (gastroesophageal reflux disease)    History  of chicken pox    Hypertension    Seizures (HCC)    1 in 2005, 1 in 2016    Sleep apnea    Stroke (HCC) 11/2014   No residual deficits   Venous malformation    Brain   Patient Active Problem List   Diagnosis Date Noted   Atrial fibrillation (HCC) 03/09/2022   Prediabetes 06/07/2021   Dyslipidemia 04/14/2020   Vitamin D insufficiency 05/13/2019   Vertigo  02/01/2018   GAD (generalized anxiety disorder) 12/06/2017   Bipolar I disorder, moderate, current or most recent episode depressed, with anxious distress (HCC) 12/06/2017   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HCC) 05/17/2017   Insomnia 05/17/2017   OSA on CPAP 05/17/2017   Seizures (HCC) 09/28/2016   Cavernous hemangioma of brain (HCC) 12/30/2014   HTN (hypertension) 12/30/2014   Past Surgical History:  Procedure Laterality Date   APPENDECTOMY  1970   age 56   bone spur     age 83   kidney stones     RAD ONC MR BRAIN GAMMA KNIFE  12/05/2016   TONSILLECTOMY  1968   UMBILICAL HERNIA REPAIR N/A 07/24/2018   Procedure: HERNIA REPAIR UMBILICAL ADULT;  Surgeon: Almond Lint, MD;  Location: [facility];  Service: General;  Laterality: N/A;    Family History  Problem Relation Age of Onset   Anxiety disorder Mother    Depression Mother    Bipolar disorder Mother    Asthma Mother    Diabetes Mother    Hypertension Mother    Pancreatic cancer Father    Diabetes Father    Cancer Father        Pancreatic   Diabetes Brother    Diabetes Brother    Bipolar disorder Maternal Grandmother    Aneurysm Maternal Grandmother    Diabetes Paternal Grandmother    Diabetes Paternal Grandfather    Colon cancer Neg Hx    Colon polyps Neg Hx    Stomach cancer Neg Hx    Rectal cancer Neg Hx    Esophageal cancer Neg Hx     Medications- reviewed and updated Current Outpatient Medications  Medication Sig Dispense Refill   atorvastatin (LIPITOR) 40 MG tablet TAKE 1 TABLET BY MOUTH EVERY DAY 90 tablet 1   buPROPion (WELLBUTRIN XL) 300 MG 24 hr tablet TAKE 1 TABLET BY MOUTH EVERY DAY 90 tablet 1   cariprazine (VRAYLAR) 3 MG capsule Take 1 capsule (3 mg total) by mouth daily. 90 capsule 1   diazepam (VALIUM) 5 MG tablet TAKE 1 TABLET BY MOUTH TWICE A DAY 180 tablet 2   icosapent Ethyl (VASCEPA) 1 g capsule TAKE 2 CAPSULES BY MOUTH TWICE A DAY 360 capsule 3   ipratropium (ATROVENT) 0.03 % nasal spray INSTILL 2 SPRAYS  INTO BOTH NOSTRILS EVERY 12 HOURS. 30 mL 1   lamoTRIgine (LAMICTAL) 100 MG tablet Take 1 tablet (100 mg total) by mouth 2 (two) times daily. 180 tablet 3   lisinopril-hydrochlorothiazide (ZESTORETIC) 20-25 MG tablet TAKE 1 TABLET BY MOUTH EVERY DAY 90 tablet 1   propranolol (INDERAL) 10 MG tablet TAKE 1 TABLET BY MOUTH UP TO FOUR TIMES DAILY AS NEEDED FOR PALPITATIONS/SVT 360 tablet 2   Semaglutide, 1 MG/DOSE, 4 MG/3ML SOPN Inject 1 mg into the skin once a week. 3 mL 0   zaleplon (SONATA) 5 MG capsule Take 1 capsule (5 mg total) by mouth at bedtime as needed for sleep. for sleep 30 capsule 0   No current facility-administered medications for this visit.  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    Married. 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      Caffeine use: 2 cup coffee per day   Social Determinants of Health   Financial Resource Strain: Not on file  Food Insecurity: Not on file  Transportation Needs: Not on file  Physical Activity: Not on file  Stress: Not on file  Social Connections: Not on file        Objective:  Physical Exam: BP 123/78   Pulse (!) 58   Temp 97.7  F (36.5 C) (Temporal)   Ht 5\' 10"  (1.778 m)   Wt 211 lb 3.2 oz (95.8 kg)   SpO2 98%   BMI 30.30 kg/m   Body mass index is 30.3 kg/m. Wt Readings from Last 3 Encounters:  11/27/22 211 lb 3.2 oz (95.8 kg)  08/07/22 200 lb (90.7 kg)  03/09/22 198 lb 12.8 oz (90.2 kg)   Gen: NAD, resting comfortably HEENT: TMs normal bilaterally. OP clear. No thyromegaly noted.  CV: RRR with no murmurs appreciated Pulm: NWOB, CTAB with no crackles, wheezes, or rhonchi GI: Normal bowel sounds present. Soft, Nontender, Nondistended. MSK: no edema, cyanosis, or clubbing noted Skin: warm, dry Neuro: CN2-12 grossly intact. Strength 5/5 in upper and lower extremities. Reflexes symmetric and intact bilaterally.  Psych: Normal affect and thought content     Keali Mccraw M. Jimmey Ralph, MD 11/27/2022 2:46 PM

## 2022-11-29 ENCOUNTER — Other Ambulatory Visit (INDEPENDENT_AMBULATORY_CARE_PROVIDER_SITE_OTHER): Payer: BC Managed Care – PPO

## 2022-11-29 DIAGNOSIS — E559 Vitamin D deficiency, unspecified: Secondary | ICD-10-CM | POA: Diagnosis not present

## 2022-11-29 DIAGNOSIS — E785 Hyperlipidemia, unspecified: Secondary | ICD-10-CM | POA: Diagnosis not present

## 2022-11-29 DIAGNOSIS — R7303 Prediabetes: Secondary | ICD-10-CM

## 2022-11-29 DIAGNOSIS — Z0001 Encounter for general adult medical examination with abnormal findings: Secondary | ICD-10-CM

## 2022-11-29 LAB — COMPREHENSIVE METABOLIC PANEL
ALT: 69 U/L — ABNORMAL HIGH (ref 0–53)
AST: 35 U/L (ref 0–37)
Albumin: 4.7 g/dL (ref 3.5–5.2)
Alkaline Phosphatase: 65 U/L (ref 39–117)
BUN: 24 mg/dL — ABNORMAL HIGH (ref 6–23)
CO2: 28 meq/L (ref 19–32)
Calcium: 10 mg/dL (ref 8.4–10.5)
Chloride: 103 meq/L (ref 96–112)
Creatinine, Ser: 1.02 mg/dL (ref 0.40–1.50)
GFR: 78.09 mL/min (ref >60.00)
Glucose, Bld: 96 mg/dL (ref 70–99)
Potassium: 4.5 meq/L (ref 3.5–5.1)
Sodium: 140 meq/L (ref 135–145)
Total Bilirubin: 0.7 mg/dL (ref 0.2–1.2)
Total Protein: 6.4 g/dL (ref 6.0–8.3)

## 2022-11-29 LAB — CBC
HCT: 47.9 % (ref 39.0–52.0)
Hemoglobin: 16 g/dL (ref 13.0–17.0)
MCHC: 33.5 g/dL (ref 30.0–36.0)
MCV: 92.3 fL (ref 78.0–100.0)
Platelets: 199 10*3/uL (ref 150.0–400.0)
RBC: 5.18 Mil/uL (ref 4.22–5.81)
RDW: 13.2 % (ref 11.5–15.5)
WBC: 8.1 10*3/uL (ref 4.0–10.5)

## 2022-11-29 LAB — LIPID PANEL
Cholesterol: 122 mg/dL (ref 0–200)
HDL: 38.6 mg/dL — ABNORMAL LOW (ref >39.00)
LDL Cholesterol: 55 mg/dL (ref 0–99)
NonHDL: 83.06
Total CHOL/HDL Ratio: 3
Triglycerides: 138 mg/dL (ref 0.0–149.0)
VLDL: 27.6 mg/dL (ref 0.0–40.0)

## 2022-11-29 LAB — TSH: TSH: 1.13 u[IU]/mL (ref 0.35–5.50)

## 2022-11-29 LAB — HEMOGLOBIN A1C: Hgb A1c MFr Bld: 5.8 % (ref 4.6–6.5)

## 2022-11-29 LAB — VITAMIN D 25 HYDROXY (VIT D DEFICIENCY, FRACTURES): VITD: 23.35 ng/mL — ABNORMAL LOW (ref 30.00–100.00)

## 2022-12-04 NOTE — Progress Notes (Signed)
Cholesterol levels are stable compared to last year.  His A1c is borderline elevated but stable at 5.8.  His kidney numbers are improving as well.  Do not need to make any changes to treatment plan based on his labs.  He can continue with his current medications and we can recheck in 6 months.  Vitamin D is a little bit low.  Recommend starting 1000 to 2000 IUs once daily and we can recheck in 3 to 6 months.  The rest of his labs are all stable and we can recheck in a year.

## 2022-12-08 ENCOUNTER — Other Ambulatory Visit: Payer: Self-pay | Admitting: Neurology

## 2022-12-10 ENCOUNTER — Other Ambulatory Visit: Payer: Self-pay | Admitting: Family Medicine

## 2022-12-12 ENCOUNTER — Other Ambulatory Visit: Payer: Self-pay | Admitting: Neurology

## 2022-12-14 ENCOUNTER — Other Ambulatory Visit: Payer: Self-pay | Admitting: Neurology

## 2022-12-14 ENCOUNTER — Encounter: Payer: Self-pay | Admitting: Neurology

## 2022-12-18 NOTE — Telephone Encounter (Signed)
Noted  

## 2022-12-23 ENCOUNTER — Other Ambulatory Visit: Payer: Self-pay | Admitting: Family Medicine

## 2022-12-23 DIAGNOSIS — G4733 Obstructive sleep apnea (adult) (pediatric): Secondary | ICD-10-CM | POA: Diagnosis not present

## 2023-01-23 DIAGNOSIS — G4733 Obstructive sleep apnea (adult) (pediatric): Secondary | ICD-10-CM | POA: Diagnosis not present

## 2023-01-31 ENCOUNTER — Encounter: Payer: Self-pay | Admitting: Family Medicine

## 2023-02-01 ENCOUNTER — Other Ambulatory Visit: Payer: Self-pay | Admitting: *Deleted

## 2023-02-01 DIAGNOSIS — M25569 Pain in unspecified knee: Secondary | ICD-10-CM

## 2023-02-01 NOTE — Telephone Encounter (Signed)
Ok to place referral though insurance may need office visit here first.  Katina Degree. Jimmey Ralph, MD 02/01/2023 10:48 AM

## 2023-02-01 NOTE — Telephone Encounter (Signed)
Ok to place referral.

## 2023-02-01 NOTE — Telephone Encounter (Signed)
Referral placed.

## 2023-02-07 ENCOUNTER — Ambulatory Visit (HOSPITAL_BASED_OUTPATIENT_CLINIC_OR_DEPARTMENT_OTHER): Payer: BC Managed Care – PPO | Admitting: Orthopaedic Surgery

## 2023-02-07 ENCOUNTER — Ambulatory Visit (HOSPITAL_BASED_OUTPATIENT_CLINIC_OR_DEPARTMENT_OTHER): Payer: BC Managed Care – PPO

## 2023-02-07 DIAGNOSIS — G8929 Other chronic pain: Secondary | ICD-10-CM

## 2023-02-07 DIAGNOSIS — M25562 Pain in left knee: Secondary | ICD-10-CM

## 2023-02-07 NOTE — Progress Notes (Signed)
 Chief Complaint: Left knee pain     History of Present Illness:    Todd Harris is a 63 y.o. male presents today with ongoing left knee pain.  He states that approximately 6 months ago his German Shepherd dog pulled him and he had landed directly on his knee.  Since that time he has had persistent pain and feeling unstable and clunking in the knee.  He is having a very difficult time going up and down stairs.  He is having a hard time standing on his for longer periods of time.  He works for [employer] but is able to work remotely.  Denies any joint line tenderness.  He is taking anti-inflammatories and tried activity restriction    PMH/PSH/Family History/Social History/Meds/Allergies:    Past Medical History:  Diagnosis Date   Anxiety    Atrial arrhythmia    Barrett esophagus    Bipolar 1 disorder (HCC)    Cavernous angioma    Chronic kidney disease    Depression    Diabetes mellitus without complication (HCC)    GERD (gastroesophageal reflux disease)    History of chicken pox    Hypertension    Seizures (HCC)    1 in 2005, 1 in 2016    Sleep apnea    Stroke (HCC) 11/2014   No residual deficits   Venous malformation    Brain   Past Surgical History:  Procedure Laterality Date   APPENDECTOMY  1970   age 52   bone spur     age 61   kidney stones     RAD ONC MR BRAIN GAMMA KNIFE  12/05/2016   TONSILLECTOMY  1968   UMBILICAL HERNIA REPAIR N/A 07/24/2018   Procedure: HERNIA REPAIR UMBILICAL ADULT;  Surgeon: Aron Shoulders, MD;  Location: [facility];  Service: General;  Laterality: N/A;   Social History   Socioeconomic History   Marital status: Married    Spouse name: Olam    Number of children: 3   Years of education: 16+   Highest education level: Not on file  Occupational History   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    Married. 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      Caffeine use: 2 cup coffee per day   Social Drivers of Corporate Investment Banker Strain: Not on file  Food Insecurity: Not on file  Transportation Needs: Not on file  Physical Activity: Not on file  Stress: Not on file  Social Connections: Not on file   Family History  Problem Relation Age of Onset   Anxiety disorder Mother    Depression Mother    Bipolar disorder Mother    Asthma Mother    Diabetes Mother    Hypertension Mother    Pancreatic cancer Father  Diabetes Father    Cancer Father        Pancreatic   Diabetes Brother    Diabetes Brother    Bipolar disorder Maternal Grandmother    Aneurysm Maternal Grandmother    Diabetes Paternal Grandmother    Diabetes Paternal Grandfather    Colon cancer Neg Hx    Colon polyps Neg Hx    Stomach cancer Neg Hx    Rectal cancer Neg Hx    Esophageal cancer Neg Hx    No Known Allergies Current Outpatient Medications  Medication Sig Dispense Refill   atorvastatin  (LIPITOR) 40 MG tablet TAKE 1 TABLET BY MOUTH EVERY DAY 90 tablet 1   buPROPion  (WELLBUTRIN  XL) 300 MG 24 hr tablet TAKE 1 TABLET BY MOUTH EVERY DAY 90 tablet 1   cariprazine  (VRAYLAR ) 3 MG capsule Take 1 capsule (3 mg total) by mouth daily. 90 capsule 1   diazepam  (VALIUM ) 5 MG tablet TAKE 1 TABLET BY MOUTH TWICE A DAY 180 tablet 2   icosapent  Ethyl (VASCEPA ) 1 g capsule TAKE 2 CAPSULES BY MOUTH TWICE A DAY 360 capsule 3   ipratropium (ATROVENT ) 0.03 % nasal spray INSTILL 2 SPRAYS INTO BOTH  NOSTRILS EVERY 12 HOURS. 30 mL 1   lamoTRIgine  (LAMICTAL ) 100 MG tablet Take 1 tablet (100 mg total) by mouth 2 (two) times daily. 180 tablet 3   lisinopril -hydrochlorothiazide  (ZESTORETIC ) 20-25 MG tablet TAKE 1 TABLET BY MOUTH EVERY DAY 90 tablet 1   propranolol  (INDERAL ) 10 MG tablet TAKE 1 TABLET BY MOUTH UP TO FOUR TIMES DAILY AS NEEDED FOR PALPITATIONS/SVT 360 tablet 2   Semaglutide , 1 MG/DOSE, (OZEMPIC , 1 MG/DOSE,) 4 MG/3ML SOPN INJECT 1MG  INTO THE SKIN ONCE A WEEK 3 mL 1   zaleplon  (SONATA ) 5 MG capsule TAKE 1 CAPSULE (5 MG TOTAL) BY MOUTH AT BEDTIME AS NEEDED FOR SLEEP. FOR SLEEP 30 capsule 0   No current facility-administered medications for this visit.   No results found.  Review of Systems:   A ROS was performed including pertinent positives and negatives as documented in the HPI.  Physical Exam :   Constitutional: NAD and appears stated age Neurological: Alert and oriented Psych: Appropriate affect and cooperative There were no vitals taken for this visit.   Comprehensive Musculoskeletal Exam:    Left knee with tenderness palpation about the patellofemoral joint.  Negative Lachman, negative posterior drawer, negative McMurray.  Range of motion is from -3 230.  Positive crepitus about the knee   Imaging:   Xray (4 views right knee): Normal    I personally reviewed and interpreted the radiographs.   Assessment and Plan:   63 y.o. male presents with persistent left knee pain after traumatic injury where he landed directly on the knee.  At today's visit I did discuss that ultimately I would like to obtain an MRI to rule out any type of underlying chondral injury given his persistent pain but downstairs in the setting of a direct blow.  Will plan to proceed with this.  I will plan to see him back following MRI   I personally saw and evaluated the patient, and participated in the management and treatment plan.  Elspeth Parker, MD Attending Physician, Orthopedic  Surgery  This document was dictated using Dragon voice recognition software. A reasonable attempt at proof reading has been made to minimize errors.

## 2023-02-07 NOTE — Progress Notes (Signed)
ri

## 2023-02-08 ENCOUNTER — Encounter (HOSPITAL_BASED_OUTPATIENT_CLINIC_OR_DEPARTMENT_OTHER): Payer: Self-pay | Admitting: Orthopaedic Surgery

## 2023-02-18 ENCOUNTER — Other Ambulatory Visit: Payer: Self-pay | Admitting: Family Medicine

## 2023-03-07 ENCOUNTER — Ambulatory Visit: Payer: BC Managed Care – PPO | Admitting: Adult Health

## 2023-03-11 ENCOUNTER — Other Ambulatory Visit: Payer: Self-pay | Admitting: Family Medicine

## 2023-03-11 DIAGNOSIS — I10 Essential (primary) hypertension: Secondary | ICD-10-CM

## 2023-03-14 ENCOUNTER — Ambulatory Visit (HOSPITAL_BASED_OUTPATIENT_CLINIC_OR_DEPARTMENT_OTHER): Payer: BC Managed Care – PPO | Admitting: Orthopaedic Surgery

## 2023-03-14 ENCOUNTER — Other Ambulatory Visit: Payer: Self-pay | Admitting: Behavioral Health

## 2023-03-14 DIAGNOSIS — G4733 Obstructive sleep apnea (adult) (pediatric): Secondary | ICD-10-CM

## 2023-03-14 DIAGNOSIS — F411 Generalized anxiety disorder: Secondary | ICD-10-CM

## 2023-03-28 NOTE — Progress Notes (Addendum)
 [facility]     Primary neurologist: Dr. Lucia Gaskins Sleep neurologist: Dr. Vickey Huger  Primary Care Physician:  Waldon Merl, PA-C    CC:  Cavernous Hemangiomas and sleep apnea  Chief Complaint  Patient presents with   Follow-up    Pt alone, rm 3. Here today for follow up visit. Overall     Follow-up visit:  Prior visit: 08/07/2022 with Dr. Vickey Huger  Brief HPI  Todd Harris is a 63 y.o. male with history of OSA on CPAP, right pons ICH secondary to cavernous hemangioma in 09/2016 s/p gamma knife with residual left sided sensory impairment, ataxia and vertigo and hx of seizures on lamotrigine.  Repeat MRI brain 09/2020 for worsening vertigo showed stable appearance of cerebral cavernous angioma, worsening vertigo improved after PT. repeat HST 04/2027 confirmed moderate to severe sleep apnea and received new CPAP machine.     Interval history:  Has been stable over the past year.  Continued intermittent vertigo/dizziness and left sided numbness unchanged since prior visit. Has noticed some difficulty with use of left hand especially with typing, he also noted this at prior visit 1 year ago.  No new symptoms over the past year.  Continues on lamotrigine 100 mg BID, tolerating well, no seizure activity.  Continues to tolerate CPAP well.  Review of compliance report showing excellent usage and optimal residual AHI.  Reports continued benefit with use of CPAP.  Routinely follows with DME [facility].  Continues on zaleplon as needed for sleep per Dr. Vickey Huger, has been beneficial and denies side effects.         Review of Systems: Patient complains of symptoms per HPI. Pertinent negatives and positives per HPI. All others negative.   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    Married. 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      Caffeine use: 2 cup coffee per day   Social Drivers of Corporate investment banker Strain: Not on file  Food Insecurity: Not on file  Transportation Needs: Not on file  Physical Activity: Not on file  Stress: Not on file  Social Connections: Not on file  Intimate Partner Violence: Not on file    Family History  Problem Relation Age of Onset   Anxiety disorder Mother    Depression Mother    Bipolar disorder Mother    Asthma Mother    Diabetes Mother    Hypertension Mother    Pancreatic cancer Father    Diabetes Father    Cancer Father        Pancreatic  Diabetes Brother    Diabetes Brother    Bipolar disorder Maternal Grandmother    Aneurysm Maternal Grandmother    Diabetes Paternal Grandmother    Diabetes Paternal Grandfather    Colon cancer Neg Hx    Colon polyps Neg Hx    Stomach cancer Neg Hx    Rectal cancer Neg Hx    Esophageal cancer Neg Hx     Past Medical History:  Diagnosis Date   Anxiety    Atrial arrhythmia    Barrett esophagus    Bipolar 1 disorder (HCC)    Cavernous angioma    Chronic kidney disease    Depression    Diabetes mellitus without complication (HCC)    GERD (gastroesophageal reflux disease)    History of chicken pox    Hypertension    Seizures (HCC)    1 in 2005, 1 in 2016    Sleep apnea    Stroke (HCC) 11/2014    No residual deficits   Venous malformation    Brain    Past Surgical History:  Procedure Laterality Date   APPENDECTOMY  1970   age 15   bone spur     age 63   kidney stones     RAD ONC MR BRAIN GAMMA KNIFE  12/05/2016   TONSILLECTOMY  1968   UMBILICAL HERNIA REPAIR N/A 07/24/2018   Procedure: HERNIA REPAIR UMBILICAL ADULT;  Surgeon: Almond Lint, MD;  Location: [facility];  Service: General;  Laterality: N/A;    Current Outpatient Medications  Medication Sig Dispense Refill   atorvastatin (LIPITOR) 40 MG tablet TAKE 1 TABLET BY MOUTH EVERY DAY 90 tablet 1   buPROPion (WELLBUTRIN XL) 300 MG 24 hr tablet TAKE 1 TABLET BY MOUTH EVERY DAY 90 tablet 1   cariprazine (VRAYLAR) 3 MG capsule Take 1 capsule (3 mg total) by mouth daily. 90 capsule 1   diazepam (VALIUM) 5 MG tablet TAKE 1 TABLET BY MOUTH TWICE A DAY 180 tablet 0   icosapent Ethyl (VASCEPA) 1 g capsule TAKE 2 CAPSULES BY MOUTH TWICE A DAY 360 capsule 3   ipratropium (ATROVENT) 0.03 % nasal spray INSTILL 2 SPRAYS INTO BOTH NOSTRILS EVERY 12 HOURS. 30 mL 1   lisinopril-hydrochlorothiazide (ZESTORETIC) 20-25 MG tablet TAKE 1 TABLET BY MOUTH EVERY DAY 90 tablet 1   propranolol (INDERAL) 10 MG tablet TAKE 1 TABLET BY MOUTH UP TO FOUR TIMES DAILY AS NEEDED FOR PALPITATIONS/SVT 360 tablet 2   Semaglutide, 1 MG/DOSE, (OZEMPIC, 1 MG/DOSE,) 4 MG/3ML SOPN INJECT 1 MG INTO THE SKIN ONE TIME PER WEEK 3 mL 1   zaleplon (SONATA) 5 MG capsule TAKE 1 CAPSULE (5 MG TOTAL) BY MOUTH AT BEDTIME AS NEEDED FOR SLEEP. FOR SLEEP 30 capsule 0   lamoTRIgine (LAMICTAL) 100 MG tablet Take 1 tablet (100 mg total) by mouth 2 (two) times daily. 180 tablet 3   No current facility-administered medications for this visit.    Allergies as of 03/29/2023   (No Known Allergies)   Physical exam: Today's Vitals   03/29/23 1242  BP: 126/85  Pulse: 66  Weight: 208 lb (94.3 kg)  Height: 5' 9.5" (1.765 m)    Body mass index is 30.28 kg/m.  General: well  developed, well nourished, very pleasant middle-age Caucasian male, seated, in no evident distress Head: head normocephalic and atraumatic.   Neck: supple with no carotid or supraclavicular bruits Cardiovascular: regular rate and rhythm, no murmurs Musculoskeletal: no deformity Skin:  no rash/petichiae Vascular:  Normal  pulses all extremities   Neurologic Exam Mental Status: Awake and fully alert. Oriented to place and time. Recent and remote memory intact. Attention span, concentration and fund of knowledge appropriate. Mood and affect appropriate.  Cranial Nerves: Pupils equal, briskly reactive to light. Extraocular movements full without nystagmus. Visual fields full to confrontation. Hearing intact. Facial sensation intact. Face, tongue, palate moves normally and symmetrically.  Motor: Normal bulk and tone. Normal strength in all tested extremity muscles except slight decreased left hand grip weakness; mild L>R postural tremor. No resting tremor  Sensory.:  Left hemisensory loss Coordination: Rapid alternating movements normal in all extremities. Finger-to-nose and heel-to-shin shows mild left sided ataxia. Gait and Station: Arises from chair without difficulty. Stance is normal. Gait demonstrates normal stride length and mild imbalance without use of assistive device.  Unable to complete tandem walk and heel toe. Romberg positive Reflexes: 1+ and symmetric. Toes downgoing.           Assessment/Plan:   Todd Harris is a 63 y.o. male  with history of multiple Cavernous Hemangiomas with right pons ICH secondary to cavernous angioma in 09/2016 with residual left hemiparesthesia, ataxia, vertigo. he is status post gamma knife on the pontine lesion.  History of seizures stable on Lamictal.  Also has history of sleep apnea on CPAP for which is currently managed by Dr. Vickey Huger.  Diagnosed with atrial fibrillation, anticoagulation contraindicated, CHA2DS2-VASc score 1, decided by cardiology to  hold off on Watchman procedure at this time unless CHA2DS2-VASc score increases with age and/or other comorbid conditions.    Hx of ICH Cavernous angiomas Chronic vertigo -overall stable, notes decreased left hand dexterity which was also noted at prior visit 1 year ago.  Discussed pursuing occupational therapy but declines interest at this time, he was encouraged to call if interested in pursuing -will f/u with Dr. Lucia Gaskins to see if repeat imaging is needed for cerebral cavernous angioma, prior imaging 09/2020 which showed stable appearance.  ADDENDUM: per Dr. Lucia Gaskins, if stable without any new symptoms, no indication for routine surveillance imaging. -no longer follows with neurosurgery  Seizures:  -Continue lamotrigine 100 mg twice daily for seizure prophylaxis -refill provided -Lamotrigine level 03/2022 3.2 - no mediation changes since, can repeat at f/u visit -Advised to call with any seizure type activity  OSA:  -Excellent compliance (100%) with optimal residual AHI   -Continue current pressure setting.   -Discussed importance of continued nightly usage with ensuring greater than 4 hours per night for optimal benefit.   -He will continue to follow with DME company for any needed supplies or CPAP related concerns. -Obtained machine 04/2022      Meds ordered this encounter  Medications   lamoTRIgine (LAMICTAL) 100 MG tablet    Sig: Take 1 tablet (100 mg total) by mouth 2 (two) times daily.    Dispense:  180 tablet    Refill:  3    No orders of the defined types were placed in this encounter.    Follow-up in 1 year to follow-up earlier if needed    Cc:  Ardith Dark, MD    I spent 30 minutes of face-to-face and non-face-to-face time with patient.  This included previsit chart review, lab review, study review, order entry, electronic health record documentation, patient education and discussion regarding above diagnoses and treatment plan and answered all other questions  to patient's satisfaction  Ihor Austin, [facility]  [facility] [address] [place], [place] [address]  Phone  [phone] Fax [phone] Note: This document was prepared with digital dictation and possible smart phrase technology. Any transcriptional errors that result from this process are unintentional.

## 2023-03-29 ENCOUNTER — Ambulatory Visit: Admitting: Adult Health

## 2023-03-29 ENCOUNTER — Encounter: Payer: Self-pay | Admitting: Adult Health

## 2023-03-29 VITALS — BP 126/85 | HR 66 | Ht 69.5 in | Wt 208.0 lb

## 2023-03-29 DIAGNOSIS — D1802 Hemangioma of intracranial structures: Secondary | ICD-10-CM | POA: Diagnosis not present

## 2023-03-29 DIAGNOSIS — G4733 Obstructive sleep apnea (adult) (pediatric): Secondary | ICD-10-CM

## 2023-03-29 DIAGNOSIS — G40209 Localization-related (focal) (partial) symptomatic epilepsy and epileptic syndromes with complex partial seizures, not intractable, without status epilepticus: Secondary | ICD-10-CM | POA: Diagnosis not present

## 2023-03-29 MED ORDER — LAMOTRIGINE 100 MG PO TABS: 100.0000 mg | ORAL_TABLET | Freq: Two times a day (BID) | ORAL | 3 refills | Status: AC

## 2023-03-29 NOTE — Patient Instructions (Addendum)
 Your Plan:  Continue lamotrigine 100mg  twice daily for seizure prevention  Continue to use CPAP nightly for adequate sleep apnea management  Will follow up with Dr. Lucia Gaskins regarding need of repeat imaging     Follow up in 1 year or call earlier if needed     Thank you for coming to see [place] at [facility]. I hope we have been able to provide you high quality care today.  You may receive a patient satisfaction survey over the next few weeks. We would appreciate your feedback and comments so that we may continue to improve ourselves and the health of our patients.

## 2023-04-02 ENCOUNTER — Telehealth: Payer: Self-pay

## 2023-04-13 ENCOUNTER — Encounter: Payer: Self-pay | Admitting: Behavioral Health

## 2023-04-13 ENCOUNTER — Ambulatory Visit: Payer: BC Managed Care – PPO | Admitting: Behavioral Health

## 2023-04-13 DIAGNOSIS — F319 Bipolar disorder, unspecified: Secondary | ICD-10-CM

## 2023-04-13 DIAGNOSIS — F411 Generalized anxiety disorder: Secondary | ICD-10-CM

## 2023-04-13 DIAGNOSIS — F3132 Bipolar disorder, current episode depressed, moderate: Secondary | ICD-10-CM

## 2023-04-13 MED ORDER — CARIPRAZINE HCL 3 MG PO CAPS: 3.0000 mg | ORAL_CAPSULE | Freq: Every day | ORAL | 1 refills | Status: DC

## 2023-04-13 MED ORDER — BUPROPION HCL ER (XL) 300 MG PO TB24: ORAL_TABLET | ORAL | 1 refills | Status: DC

## 2023-04-13 NOTE — Progress Notes (Signed)
 [facility]  Patient ID: Todd Harris,  MRN: [id number]  PCP: Ardith Dark, MD  Date of Evaluation: 04/13/2023 Time spent:20 minutes  Chief Complaint:  Chief Complaint   Depression; Anxiety; Follow-up; Medication Refill; Patient Education     HISTORY/CURRENT STATUS: HPI   63 year old male presents to this office for follow up and medication management. Continued great stability.  No psychosocial changes this visit.  Says he has been doing very well.  Reports using Valium sporadically as necessary.  Still traveling with his job. Says his anxiety and depression have been well controlled. No changes since last visit. No mania or depressive episodes since last visit. No changes to medication regimen are indicate at this time. Says he is not concerned too much about his mental health right now but has some things with his physical health to work on. He says he has had problems with A-fib and is following up with Cardiologist regularly.  He reports anxiety today 2/10 and depression 0/10. Says he sleeps 7 hours per night. Reports no mania. No psychosis. No SI/HI.        Individual Medical History/ Review of Systems: Changes? :No   Allergies: Patient has no known allergies.  Current Medications:  Current Outpatient Medications:    atorvastatin (LIPITOR) 40 MG tablet, TAKE 1 TABLET BY MOUTH EVERY DAY, Disp: 90 tablet, Rfl: 1   buPROPion (WELLBUTRIN XL) 300 MG 24 hr tablet, TAKE 1 TABLET BY MOUTH EVERY DAY, Disp: 90 tablet, Rfl: 1   cariprazine (VRAYLAR) 3 MG capsule, Take 1 capsule (3 mg total) by mouth daily., Disp: 90 capsule, Rfl: 1   diazepam (VALIUM) 5 MG tablet, TAKE 1 TABLET BY MOUTH TWICE A DAY, Disp: 180 tablet, Rfl: 0   icosapent Ethyl (VASCEPA) 1 g capsule, TAKE 2 CAPSULES BY MOUTH TWICE A DAY, Disp: 360 capsule, Rfl: 3   ipratropium (ATROVENT) 0.03 % nasal spray, INSTILL 2 SPRAYS INTO BOTH NOSTRILS EVERY 12 HOURS., Disp: 30 mL, Rfl: 1   lamoTRIgine (LAMICTAL)  100 MG tablet, Take 1 tablet (100 mg total) by mouth 2 (two) times daily., Disp: 180 tablet, Rfl: 3   lisinopril-hydrochlorothiazide (ZESTORETIC) 20-25 MG tablet, TAKE 1 TABLET BY MOUTH EVERY DAY, Disp: 90 tablet, Rfl: 1   propranolol (INDERAL) 10 MG tablet, TAKE 1 TABLET BY MOUTH UP TO FOUR TIMES DAILY AS NEEDED FOR PALPITATIONS/SVT, Disp: 360 tablet, Rfl: 2   Semaglutide, 1 MG/DOSE, (OZEMPIC, 1 MG/DOSE,) 4 MG/3ML SOPN, INJECT 1 MG INTO THE SKIN ONE TIME PER WEEK, Disp: 3 mL, Rfl: 1   zaleplon (SONATA) 5 MG capsule, TAKE 1 CAPSULE (5 MG TOTAL) BY MOUTH AT BEDTIME AS NEEDED FOR SLEEP. FOR SLEEP, Disp: 30 capsule, Rfl: 0 Medication Side Effects: none  Family Medical/ Social History: Changes? No  MENTAL HEALTH EXAM:  There were no vitals taken for this visit.There is no height or weight on file to calculate BMI.  General Appearance: Casual, Neat, and Well Groomed  Eye Contact:  Good  Speech:  Clear and Coherent  Volume:  Normal  Mood:  NA  Affect:  Appropriate  Thought Process:  Coherent  Orientation:  Full (Time, Place, and Person)  Thought Content: Logical   Suicidal Thoughts:  No  Homicidal Thoughts:  No  Memory:  WNL  Judgement:  Good  Insight:  Good  Psychomotor Activity:  Normal  Concentration:  Concentration: Good  Recall:  Good  Fund of Knowledge: Good  Language: Good  Assets:  Desire for  Improvement  ADL's:  Intact  Cognition: WNL  Prognosis:  Good    DIAGNOSES:    ICD-10-CM   1. GAD (generalized anxiety disorder)  F41.1 buPROPion (WELLBUTRIN XL) 300 MG 24 hr tablet    cariprazine (VRAYLAR) 3 MG capsule    2. Bipolar I disorder, moderate, current or most recent episode depressed, with anxious distress (HCC)  F31.32 buPROPion (WELLBUTRIN XL) 300 MG 24 hr tablet    3. Bipolar I disorder (HCC)  F31.9 cariprazine (VRAYLAR) 3 MG capsule      Receiving Psychotherapy: No    RECOMMENDATIONS:  Greater than 50% of 30 min face to face time with patient was spent on  counseling and coordination of care. We discussed his current stability with bipolar disorder. He has not experienced any manic or depressive episodes since last visit.  Discussed potential benefits, risk, and side effects of benzodiazepines to include potential risk of tolerance and dependence, as well as possible drowsiness.  Advised patient not to drive if experiencing drowsiness and to take lowest possible effective dose to minimize risk of dependence and tolerance.  Patient will be traveling out of country extensively for job and request 6 month follow up.  Reviewed patients most recent labs and nothing of concern this visit.    Continue Vraylar to 3 mg daily Continue Wellbutrin 300 mg XL daily Continue Valium  5 mg tablet twice daily Continue Lamictal 100 mg twice daily Follow up in 6 months per patient to reassess.  To report any worsening symptoms  or side effects promptly Monitor any increase in dizziness and notify if worsen PDMP reviewed.     Joan Flores, NP

## 2023-04-14 ENCOUNTER — Other Ambulatory Visit: Payer: Self-pay | Admitting: Family Medicine

## 2023-04-16 ENCOUNTER — Telehealth: Payer: Self-pay

## 2023-04-16 ENCOUNTER — Other Ambulatory Visit (HOSPITAL_COMMUNITY): Payer: Self-pay

## 2023-04-16 NOTE — Telephone Encounter (Signed)
 Pharmacy Patient Advocate Encounter   Received notification from CoverMyMeds that prior authorization for Ozempic (1 MG/DOSE) 4MG /3ML pen-injectors is required/requested.   Insurance verification completed.   The patient is insured through [facility] .   Per test claim: PA required; PA submitted to above mentioned insurance via CoverMyMeds Key/confirmation #/EOC [facility] Status is pending

## 2023-04-17 ENCOUNTER — Encounter: Payer: Self-pay | Admitting: Family Medicine

## 2023-04-17 NOTE — Telephone Encounter (Signed)
 Pharmacy Patient Advocate Encounter  Received notification from [facility] that Prior Authorization for Ozempic (1 MG/DOSE) 4MG /3ML pen-injectors  has been DENIED.  Full denial letter will be uploaded to the media tab. See denial reason below.   PA #/Case ID/Reference #: [id number]

## 2023-04-17 NOTE — Telephone Encounter (Signed)
**Note De-identified  [place] Obfuscation** Please advise 

## 2023-04-17 NOTE — Telephone Encounter (Signed)
 Please check to see if he needs PA.  Jinny Mounts. Daneil Dunker, MD 04/17/2023 6:59 PM

## 2023-04-18 NOTE — Telephone Encounter (Signed)
Unable to contact patient, voice mail is full  

## 2023-04-19 ENCOUNTER — Other Ambulatory Visit (HOSPITAL_COMMUNITY): Payer: Self-pay

## 2023-04-19 NOTE — Telephone Encounter (Signed)
 Noted.

## 2023-04-21 ENCOUNTER — Other Ambulatory Visit: Payer: Self-pay | Admitting: Family Medicine

## 2023-04-21 DIAGNOSIS — E785 Hyperlipidemia, unspecified: Secondary | ICD-10-CM

## 2023-04-24 NOTE — Telephone Encounter (Signed)
 Please advise if PA was completed

## 2023-04-25 NOTE — Telephone Encounter (Signed)
 noted

## 2023-04-26 ENCOUNTER — Telehealth: Payer: Self-pay

## 2023-04-26 ENCOUNTER — Other Ambulatory Visit (HOSPITAL_COMMUNITY): Payer: Self-pay

## 2023-04-26 NOTE — Telephone Encounter (Signed)
 Pharmacy Patient Advocate Encounter   Received notification from Patient Advice Request messages that prior authorization for Ozempic  is required/requested.   Pt is not diabetic; Ozempic  will not be covered for pt unless they have type 2 diabetes.   Per test claim:  Wegovy  is preferred by the insurance.  If suggested medication is appropriate, Please send in a new RX and discontinue this one. If not, please advise as to why it's not appropriate so that we may request a Prior Authorization. Please note, some preferred medications may still require a PA.  If the suggested medications have not been trialed and there are no contraindications to their use, the PA will not be submitted, as it will not be approved.  WEGOVY  WOULD ALSO REQUIRE PA

## 2023-04-27 ENCOUNTER — Other Ambulatory Visit (HOSPITAL_COMMUNITY): Payer: Self-pay

## 2023-04-27 ENCOUNTER — Telehealth: Payer: Self-pay | Admitting: Pharmacy Technician

## 2023-04-27 ENCOUNTER — Other Ambulatory Visit: Payer: Self-pay | Admitting: *Deleted

## 2023-04-27 MED ORDER — WEGOVY 1 MG/0.5ML ~~LOC~~ SOAJ: 1.0000 mg | SUBCUTANEOUS | 1 refills | Status: DC

## 2023-04-27 NOTE — Telephone Encounter (Signed)
 Patient aware Rx Wegovy  send to pharmacy

## 2023-04-27 NOTE — Telephone Encounter (Signed)
 See previews note

## 2023-04-27 NOTE — Telephone Encounter (Signed)
 Pharmacy Patient Advocate Encounter  Received notification from [facility] that Prior Authorization for wegovy  1mg  has been APPROVED from 04/27/23 to 11/23/23. Ran test claim, Copay is [id number]. This test claim was processed through [facility]- copay amounts may vary at other pharmacies due to pharmacy/plan contracts, or as the patient moves through the different stages of their insurance plan.   PA #/Case ID/Reference #: [id number]    I called the patient and left him a message to call back to let him know the cost and that he could try to apply for a coupon at [email or web address]

## 2023-04-27 NOTE — Telephone Encounter (Signed)
 Pharmacy Patient Advocate Encounter   Received notification from CoverMyMeds that prior authorization for wegovy  is required/requested.   Insurance verification completed.   The patient is insured through [facility] .   Per test claim: PA required; PA submitted to above mentioned insurance via CoverMyMeds Key/confirmation #/EOC [id number] Status is pending

## 2023-04-27 NOTE — Telephone Encounter (Signed)
**Note De-identified  [place] Obfuscation** Please advise 

## 2023-04-27 NOTE — Telephone Encounter (Signed)
 Please let patient know they will only pay for his Ozempic  if he has had a diagnosis of diabetes.  We can switch him to Wegovy  which is the same medication however indicated for weight loss.  Recommend sending Wegovy  1 mg weekly.

## 2023-07-05 ENCOUNTER — Other Ambulatory Visit (HOSPITAL_COMMUNITY): Payer: Self-pay

## 2023-07-05 ENCOUNTER — Ambulatory Visit: Payer: BC Managed Care – PPO | Admitting: Family Medicine

## 2023-07-05 ENCOUNTER — Encounter: Payer: Self-pay | Admitting: Family Medicine

## 2023-07-05 ENCOUNTER — Telehealth: Payer: Self-pay

## 2023-07-05 VITALS — BP 120/78 | HR 75 | Temp 98.0°F | Ht 69.5 in | Wt 214.4 lb

## 2023-07-05 DIAGNOSIS — E1169 Type 2 diabetes mellitus with other specified complication: Secondary | ICD-10-CM | POA: Diagnosis not present

## 2023-07-05 DIAGNOSIS — E1159 Type 2 diabetes mellitus with other circulatory complications: Secondary | ICD-10-CM | POA: Diagnosis not present

## 2023-07-05 DIAGNOSIS — E785 Hyperlipidemia, unspecified: Secondary | ICD-10-CM

## 2023-07-05 DIAGNOSIS — Z7985 Long-term (current) use of injectable non-insulin antidiabetic drugs: Secondary | ICD-10-CM

## 2023-07-05 DIAGNOSIS — E1165 Type 2 diabetes mellitus with hyperglycemia: Secondary | ICD-10-CM

## 2023-07-05 DIAGNOSIS — I152 Hypertension secondary to endocrine disorders: Secondary | ICD-10-CM

## 2023-07-05 DIAGNOSIS — I480 Paroxysmal atrial fibrillation: Secondary | ICD-10-CM

## 2023-07-05 DIAGNOSIS — R7303 Prediabetes: Secondary | ICD-10-CM

## 2023-07-05 LAB — POCT GLYCOSYLATED HEMOGLOBIN (HGB A1C): Hemoglobin A1C: 5.7 % — AB (ref 4.0–5.6)

## 2023-07-05 MED ORDER — TIRZEPATIDE 2.5 MG/0.5ML ~~LOC~~ SOAJ: 2.5000 mg | SUBCUTANEOUS | 0 refills | Status: DC

## 2023-07-05 NOTE — Telephone Encounter (Signed)
 Highest A1C in patient chart is 6.3 04/14/20. Today's A1C 5.7.  Tired to test bill Zepbound 2.5, requires PA please advise  Ozempic Brena is approved exclusively as an adjunct to diet and exercise to improve glycemic  control in adults with type 2 diabetes mellitus. A review of patient's medical chart reveals no  documented diagnosis of type 2 diabetes or an A1C indicative of diabetes. Therefore, they do not  currently meet the criteria for prior authorization of this medication. If clinically appropriate, alternative  options such as Saxenda, Zepbound, or Wegovy  may be considered for this patient.

## 2023-07-05 NOTE — Assessment & Plan Note (Signed)
 He is working on lifestyle modifications.  Recent lipids at goal on Vascepa  2 g twice daily and Lipitor 40 mg daily.

## 2023-07-05 NOTE — Assessment & Plan Note (Signed)
 His A1c today is 5.7 however he has been off of Ozempic  for the last several months.  He has previously been on this for the last few years and did very well with this.  We did discuss with patient that he has had a few fasting sugars in the diabetic range including a fasting sugar of 162 in 2020 and a fasting sugar of 130 in 2019.  His most recent A1c's have been at goal due to being on GLP agonist for the last couple of years.  His highest A1c was 6.3 a couple years ago before starting GLP-1 agonist.  He would like to restart GLP-1 agonist today.  We discussed potential options.  Will start Mounjaro 2.5 mg weekly.  He is aware potential side effects.  He will continue to work on diet and exercise as well.  He will follow-up with us  in a few weeks via MyChart and we can adjust the dose as tolerated.

## 2023-07-05 NOTE — Assessment & Plan Note (Signed)
 Regular rate and rhythm today.  Continue management per cardiology.  He uses propranolol  as needed.

## 2023-07-05 NOTE — Assessment & Plan Note (Signed)
 Blood pressure at goal today on lisinopril-HCTZ 20-25 once daily.

## 2023-07-05 NOTE — Progress Notes (Signed)
   Todd Harris is a 63 y.o. male who presents today for an office visit.  Assessment/Plan:  Chronic Problems Addressed Today: Type 2 diabetes mellitus with hyperglycemia (HCC) His A1c today is 5.7 however he has been off of Ozempic  for the last several months.  He has previously been on this for the last few years and did very well with this.  We did discuss with patient that he has had a few fasting sugars in the diabetic range including a fasting sugar of 162 in 2020 and a fasting sugar of 130 in 2019.  His most recent A1c's have been at goal due to being on GLP agonist for the last couple of years.  His highest A1c was 6.3 a couple years ago before starting GLP-1 agonist.  He would like to restart GLP-1 agonist today.  We discussed potential options.  Will start Mounjaro 2.5 mg weekly.  He is aware potential side effects.  He will continue to work on diet and exercise as well.  He will follow-up with us  in a few weeks via MyChart and we can adjust the dose as tolerated.  Hypertension associated with diabetes (HCC) Blood pressure at goal today on lisinopril  HCTZ 20-25 once daily.  Dyslipidemia associated with type 2 diabetes mellitus (HCC) He is working on lifestyle modifications.  Recent lipids at goal on Vascepa  2 g twice daily and Lipitor 40 mg daily.  Atrial fibrillation (HCC) Regular rate and rhythm today.  Continue management per cardiology.  He uses propranolol  as needed.    Subjective:  HPI:  See Assessment / plan for status of chronic conditions.  Patient is here today for follow-up.  I last saw him about 8 months ago for his annual physical.  At that time his A1c was very well-controlled at 5.8 on ozempic  we increased the dose to 1 mg weekly.  Unfortunately since our last visit he has been off medication due to insurance issues.        Objective:  Physical Exam: BP 120/78   Pulse 75   Temp 98 F (36.7 C) (Temporal)   Ht 5' 9.5 (1.765 m)   Wt 214 lb 6.4 oz (97.3 kg)    SpO2 98%   BMI 31.21 kg/m   Wt Readings from Last 3 Encounters:  07/05/23 214 lb 6.4 oz (97.3 kg)  03/29/23 208 lb (94.3 kg)  11/27/22 211 lb 3.2 oz (95.8 kg)  Gen: No acute distress, resting comfortably CV: Regular rate and rhythm with no murmurs appreciated Pulm: Normal work of breathing, clear to auscultation bilaterally with no crackles, wheezes, or rhonchi Neuro: Grossly normal, moves all extremities Psych: Normal affect and thought content      Sonoma Firkus M. Kennyth, MD 07/05/2023 9:49 AM

## 2023-07-05 NOTE — Patient Instructions (Signed)
 It was very nice to see you today!  We will start Mounjaro.  Take 2.5 mg weekly.  Let me know in a few weeks how this is working for you.  Return in about 3 months (around 10/05/2023) for Follow Up.   Take care, Dr Kennyth  PLEASE NOTE:  If you had any lab tests, please let us  know if you have not heard back within a few days. You may see your results on mychart before we have a chance to review them but we will give you a call once they are reviewed by us .   If we ordered any referrals today, please let us  know if you have not heard from their office within the next week.   If you had any urgent prescriptions sent in today, please check with the pharmacy within an hour of our visit to make sure the prescription was transmitted appropriately.   Please try these tips to maintain a healthy lifestyle:  Eat at least 3 REAL meals and 1-2 snacks per day.  Aim for no more than 5 hours between eating.  If you eat breakfast, please do so within one hour of getting up.   Each meal should contain half fruits/vegetables, one quarter protein, and one quarter carbs (no bigger than a computer mouse)  Cut down on sweet beverages. This includes juice, soda, and sweet tea.   Drink at least 1 glass of water with each meal and aim for at least 8 glasses per day  Exercise at least 150 minutes every week.

## 2023-07-10 ENCOUNTER — Encounter: Payer: Self-pay | Admitting: Family Medicine

## 2023-07-10 ENCOUNTER — Telehealth: Payer: Self-pay

## 2023-07-10 ENCOUNTER — Other Ambulatory Visit (HOSPITAL_COMMUNITY): Payer: Self-pay

## 2023-07-10 NOTE — Telephone Encounter (Signed)
 His A1cs have been well controlled due to being on Ozempic  for the last couple of years though he does have a couple of elevated glucoses into the diabetic range.   Worth HERO. Kennyth, MD 07/10/2023 12:42 PM

## 2023-07-10 NOTE — Telephone Encounter (Signed)
**Note De-identified  [place] Obfuscation** Please advise 

## 2023-07-10 NOTE — Telephone Encounter (Signed)
 Pharmacy Patient Advocate Encounter   Received notification from Physician's Office that prior authorization for Mounjaro  2.5MG /0.5ML auto-injectors is required/requested.   Insurance verification completed.   The patient is insured through [facility] .   Per test claim: PA required; PA submitted to above mentioned insurance via CoverMyMeds Key/confirmation #/EOC [id number] Status is pending

## 2023-07-10 NOTE — Telephone Encounter (Signed)
 Please see Dr. Shaune message about Ozempic  and please do PA for Mounjaro .

## 2023-07-11 ENCOUNTER — Other Ambulatory Visit (HOSPITAL_COMMUNITY): Payer: Self-pay

## 2023-07-11 NOTE — Telephone Encounter (Signed)
 Pharmacy Patient Advocate Encounter  Received notification from [facility] that Prior Authorization for Mounjaro  2.5MG /0.5ML auto-injectors has been DENIED.  Full denial letter will be uploaded to the media tab. See denial reason below.  Unfortunately the insurance denied with the A1C of 6.3% .SABRA They are very strict on that 6.5%   PA #/Case ID/Reference #: [id number] YW.   -Patient currently has an approval on file for [facility] until 11/23/23. I ran a test claim and got a copay of $304.75 He could try that if appropriate.

## 2023-07-12 NOTE — Telephone Encounter (Signed)
 Dr. Kennyth is currently working with PA team

## 2023-07-12 NOTE — Telephone Encounter (Signed)
 Wegovy  is not appropriate.    He has type 2 diabetes that has been managed with Ozempic  for the last few years.  He established care with me when he was already treated for his diabetes.  We switched his metformin  to Ozempic  and his A1c's have been well-managed on this.  We do not have an A1c greater than 6.5 as this was diagnosed before he established with me with his previous PCP.   He does have a fasting sugar of 170 from July 2020 which should be covered under the plan per the denial letter.   Can we resubmit?    Worth HERO. Kennyth, MD 07/12/2023 12:59 PM

## 2023-07-13 ENCOUNTER — Other Ambulatory Visit (HOSPITAL_COMMUNITY): Payer: Self-pay

## 2023-07-16 ENCOUNTER — Other Ambulatory Visit (HOSPITAL_COMMUNITY): Payer: Self-pay

## 2023-07-17 ENCOUNTER — Telehealth: Payer: Self-pay | Admitting: Pharmacist

## 2023-07-17 ENCOUNTER — Other Ambulatory Visit (HOSPITAL_COMMUNITY): Payer: Self-pay

## 2023-07-17 NOTE — Telephone Encounter (Signed)
 Appeal has been submitted for Mounjaro . Will advise when response is received, please be advised that most companies may take 30 days to make a decision. Appeal letter and supporting documentation have been faxed to [phone] on 07/17/2023 @12 :05 pm.  Thank you, Devere Pandy, PharmD Clinical Pharmacist  [place]  Direct Dial: [phone]

## 2023-07-18 NOTE — Telephone Encounter (Signed)
 Additional information has been requested from the patient's insurance in order to proceed with the appeal request. Requested information has been sent, or form has been filled out and faxed back to [phone]

## 2023-07-19 NOTE — Telephone Encounter (Signed)
 Insurance has approved the appeal for Mounjaro , full letter can be found under the media tab.    Thank you, Devere Pandy, PharmD Clinical Pharmacist  [place]  Direct Dial: [phone]

## 2023-07-19 NOTE — Telephone Encounter (Signed)
 Patient notified Mounjaro  been approved by insurance

## 2023-07-26 DIAGNOSIS — G4733 Obstructive sleep apnea (adult) (pediatric): Secondary | ICD-10-CM | POA: Diagnosis not present

## 2023-08-06 ENCOUNTER — Encounter: Payer: Self-pay | Admitting: Family Medicine

## 2023-08-07 ENCOUNTER — Other Ambulatory Visit: Payer: Self-pay | Admitting: *Deleted

## 2023-08-07 MED ORDER — FREESTYLE LIBRE 3 SENSOR MISC: 5 refills | Status: DC

## 2023-08-07 NOTE — Telephone Encounter (Signed)
 Rx [facility] libre [facility]

## 2023-08-13 ENCOUNTER — Other Ambulatory Visit: Payer: Self-pay | Admitting: Family Medicine

## 2023-08-15 NOTE — Progress Notes (Signed)
 SABRA

## 2023-08-16 ENCOUNTER — Encounter: Payer: Self-pay | Admitting: Neurology

## 2023-08-16 ENCOUNTER — Ambulatory Visit (INDEPENDENT_AMBULATORY_CARE_PROVIDER_SITE_OTHER): Payer: BC Managed Care – PPO | Admitting: Neurology

## 2023-08-16 VITALS — BP 111/68 | HR 69 | Ht 69.0 in | Wt 208.0 lb

## 2023-08-16 DIAGNOSIS — I613 Nontraumatic intracerebral hemorrhage in brain stem: Secondary | ICD-10-CM

## 2023-08-16 NOTE — Patient Instructions (Signed)
 Expand All Collapse AllASSESSMENT AND PLAN :      63 - year old male MBA here with:     1) Todd Harris is doing excellent with his new CPAP machine and the settings between 5 and 16 cm water with 3 cm expiratory relief.  He is of course allowed to use a mask of his comfort, he was not allowed snoring therefore it is not necessary to use a fullface mask in my opinion.  The patient did not have significant hypoxia or bradycardia, the goal would be to try to stay off the back as a sleep position that exacerbates apnea in his case so when sleeping on his sides his apnea index is 18 and when sleeping on his back his apnea index was 36.  He       2) the 91st percentile pressure was 10 cm water which is right in the middle of his current window.  We do not need to change the settings.   3) the patient had a concern about vertigo about 2-1/2 months ago, this has resolved after he was taken off of Vrylar , so it seems that the medication caused at least lightheadedness.   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  This condition has been known for years.  He used to be on Depakote and was switched to Lamictal  which is provided by our office.     Rv in 12 month with Harlene Bogaert., NP     Patent asked about memory loss concerns but would need another appointment for dedicated evaluation.   There are well-accepted and sensible ways to reduce risk for Alzheimers disease and other degenerative brain disorders .  Exercise Daily Walk A daily 20 minute walk should be part of your routine. Disease related apathy can be a significant roadblock to exercise and the only way to overcome this is to make it a daily routine and perhaps have a reward at the end (something your loved one loves to eat or drink perhaps) or a personal trainer coming to the home can also be very useful. Most importantly, the patient is much more likely to  exercise if the caregiver / spouse does it with him/her. In general a structured, repetitive schedule is best.  General Health: Any diseases which effect your body will effect your brain such as a pneumonia, urinary infection, blood clot, heart attack or stroke. Keep contact with your primary care doctor for regular follow ups.  Sleep. A good nights sleep is healthy for the brain. Seven hours is recommended. If you have insomnia or poor sleep habits we can give you some instructions. If you have sleep apnea wear your mask.  Diet: Eating a heart healthy diet is also a good idea; fish and poultry instead of red meat, nuts (mostly non-peanuts), vegetables, fruits, olive oil or canola oil (instead of butter), minimal salt (use other spices to flavor foods), whole grain rice, bread, cereal and pasta and wine in moderation.Research is now showing that the MIND diet, which is a combination of The Mediterranean diet and the DASH diet, is beneficial for cognitive processing and longevity. Information about this diet can be found in The MIND Diet, a book by Annitta Feeling, MS, RDN, and online at WildWildScience.es  Finances, Power of [address] and Advance Directives: You should consider putting legal safeguards in place with regard to financial and medical decision making. While the spouse always  has power of attorney for medical and financial issues in the absence of any form, you should consider what you want in case the spouse / caregiver is no longer around or capable of making decisions.   The Alzheimers Association Position on Disease Prevention  Can Alzheimer's be prevented? It's a question that continues to intrigue researchers and fuel new investigations. There are no clear-cut answers yet -- partially due to the need for more large-scale studies in diverse populations -- but promising research is under way. The Alzheimer's Association is leading the worldwide effort to find a  treatment for Alzheimer's, delay its onset and prevent it from developing.   What causes Alzheimer's? Experts agree that in the vast majority of cases, Alzheimer's, like other common chronic conditions, probably develops as a result of complex interactions among multiple factors, including age, genetics, environment, lifestyle and coexisting medical conditions. Although some risk factors -- such as age or genes -- cannot be changed, other risk factors -- such as high blood pressure and lack of exercise -- usually can be changed to help reduce risk. Research in these areas may lead to new ways to detect those at highest risk.  Prevention studies A small percentage of people with Alzheimer's disease (less than 1 percent) have an early-onset type associated with genetic mutations. Individuals who have these genetic mutations are guaranteed to develop the disease. An ongoing clinical trial conducted by the Dominantly Inherited Alzheimer Network (DIAN), is testing whether antibodies to beta-amyloid can reduce the accumulation of beta-amyloid plaque in the brains of people with such genetic mutations and thereby reduce, delay or prevent symptoms. Participants in the trial are receiving antibodies (or placebo) before they develop symptoms, and the development of beta-amyloid plaques is being monitored by brain scans and other tests.  Another clinical trial, known as the A4 trial (Anti-Amyloid Treatment in Asymptomatic Alzheimer's), is testing whether antibodies to beta-amyloid can reduce the risk of Alzheimer's disease in older people (ages 27 to 73) at high risk for the disease. The A4 trial is being conducted by the Alzheimer's Disease Cooperative Study.  Though research is still evolving, evidence is strong that people can reduce their risk by making key lifestyle changes, including participating in regular activity and maintaining good heart health. Based on this research, the Alzheimer's Association offers  10 Ways to Love Your Brain -- a collection of tips that can reduce the risk of cognitive decline.  Heart-head connection  New research shows there are things we can do to reduce the risk of mild cognitive impairment and dementia.  Several conditions known to increase the risk of cardiovascular disease -- such as high blood pressure, diabetes and high cholesterol -- also increase the risk of developing Alzheimer's. Some autopsy studies show that as many as 80 percent of individuals with Alzheimer's disease also have cardiovascular disease.  A longstanding question is why some people develop hallmark Alzheimer's plaques and tangles but do not develop the symptoms of Alzheimer's. Vascular disease may help researchers eventually find an answer. Some autopsy studies suggest that plaques and tangles may be present in the brain without causing symptoms of cognitive decline unless the brain also shows evidence of vascular disease. More research is needed to better understand the link between vascular health and Alzheimer's.  Physical exercise and diet Regular physical exercise may be a beneficial strategy to lower the risk of Alzheimer's and vascular dementia. Exercise may directly benefit brain cells by increasing blood and oxygen flow in the brain. Because of  its known cardiovascular benefits, a medically approved exercise program is a valuable part of any overall wellness plan.  Current evidence suggests that heart-healthy eating may also help protect the brain. Heart-healthy eating includes limiting the intake of sugar and saturated fats and making sure to eat plenty of fruits, vegetables, and whole grains. No one diet is best. Two diets that have been studied and may be beneficial are the DASH (Dietary Approaches to Stop Hypertension) diet and the Mediterranean diet. The DASH diet emphasizes vegetables, fruits and fat-free or low-fat dairy products; includes whole grains, fish, poultry, beans, seeds, nuts  and vegetable oils; and limits sodium, sweets, sugary beverages and red meats. A Mediterranean diet includes relatively little red meat and emphasizes whole grains, fruits and vegetables, fish and shellfish, and nuts, olive oil and other healthy fats.  Social connections and intellectual activity A number of studies indicate that maintaining strong social connections and keeping mentally active as we age might lower the risk of cognitive decline and Alzheimer's. Experts are not certain about the reason for this association. It may be due to direct mechanisms through which social and mental stimulation strengthen connections between nerve cells in the brain.  Head trauma There appears to be a strong link between future risk of Alzheimer's and serious head trauma, especially when injury involves loss of consciousness. You can help reduce your risk of Alzheimer's by protecting your head.  Wear a seat belt  Use a helmet when participating in sports  Fall-proof your home   What you can do now While research is not yet conclusive, certain lifestyle choices, such as physical activity and diet, may help support brain health and prevent Alzheimer's. Many of these lifestyle changes have been shown to lower the risk of other diseases, like heart disease and diabetes, which have been linked to Alzheimer's. With few drawbacks and plenty of known benefits, healthy lifestyle choices can improve your health and possibly protect your brain.  Learn more about brain health. You can help increase our knowledge by considering participation in a clinical study. Our free clinical trial matching services, TrialMatch, can help you find clinical trials in your area that are seeking volunteers.  Understanding prevention research Here are some things to keep in mind about the research underlying much of our current knowledge about possible prevention:  Insights about potentially modifiable risk factors apply to large  population groups, not to individuals. Studies can show that factor X is associated with outcome Y, but cannot guarantee that any specific person will have that outcome. As a result, you can do everything right and still have a serious health problem or do everything wrong and live to be 100.  Much of our current evidence comes from large epidemiological studies such as the [place] Aging Study, the Nurses' Health Study, the Adult Changes in Thought Study and the [employer]. These studies explore pre-existing behaviors and use statistical methods to relate those behaviors to health outcomes. This type of study can show an association between a factor and an outcome but cannot prove cause and effect. This is why we describe evidence based on these studies with such language as suggests, may show, might protect, and is associated with.  The gold standard for showing cause and effect is a clinical trial in which participants are randomly assigned to a prevention or risk management strategy or a control group. Researchers follow the two groups over time to see if their outcomes differ significantly.  It is unlikely that some  prevention or risk management strategies will ever be tested in randomized trials for ethical or practical reasons. One example is exercise. Definitively testing the impact of exercise on Alzheimer's risk would require a huge trial enrolling thousands of people and following them for many years. The expense and logistics of such a trial would be prohibitive, and it would require some people to go without exercise, a known health benefit.     Problems With Thinking and Memory (Mild Neurocognitive Disorder): What to Know Mild neurocognitive disorder, formerly known as mild cognitive impairment, is a disorder where your memory doesn't work as well as it should. It may also affect other mental abilities like thinking, communicating, behavior, and being able to finish  tasks. These problems can be noticed and measured. But they usually don't stop you from doing daily activities or living on your own. Mild neurocognitive disorder usually happens after 64 years of age. But it can also happen at younger ages. It's not as serious as major neurocognitive disorder, also known as dementia, but it may be the first sign of it. In general, the symptoms of this condition get worse over time. In rare cases, symptoms can get better. What are the causes? This condition may be caused by: Brain disorders like Alzheimer's disease, Parkinson's disease, and other conditions that slowly damage nerve cells. Diseases that affect the blood vessels in the brain and cause small strokes. Certain infections, like HIV. Traumatic brain injury. Other medical conditions, such as brain tumors, underactive thyroid  (hypothyroidism), and not having enough vitamin B12. Using certain drugs or medicines. What increases the risk? Being older than 64 years of age. Being male. Having a lower level of education. Diabetes, high blood pressure, high cholesterol, and other conditions that raise the risk for blood vessel diseases. Untreated or undertreated sleep apnea. Having a certain type of gene that can be inherited, or passed down from parent to child. Long-term health problems like heart disease, lung disease, liver disease, kidney disease, or depression. What are the signs or symptoms? Trouble remembering things. You may: Forget names, phone numbers, or details of recent events. Forget about social events and appointments. Often forget where you put your car keys or other items. Trouble thinking and solving problems. You may have trouble with complex tasks like: Paying bills. Driving in places you don't know well. Trouble communicating. You may have trouble: Finding the right word or naming an object. Forming a sentence that makes sense. Understanding what you read or hear. Changes in your  behavior or personality. When this happens, you may: Lose interest in the things you used to enjoy. Avoid being around people. Get angry more easily than usual. Act before thinking. How is this diagnosed? This condition is diagnosed based on: Your symptoms. Your health care provider may ask you and the people you spend time with, like family and friends, about your symptoms. Memory tests and other tests to check how your brain is working. Your provider may refer you to a provider called a neurologist or a mental health specialist. To try to find out the cause of your condition, your provider may: Get a detailed medical history. Ask about use of alcohol, drugs, and medicines. Do a physical exam. Order blood tests and brain imaging tests. How is this treated? Mild neurocognitive disorder that's caused by medicine use, drug use, infection, or another medical condition may get better when the cause is treated, or when medicines or drugs are stopped. If this disorder has another cause, it  usually doesn't improve and may get worse. In these cases, the goal of treatment is to help you manage the symptoms. This may include: Medicines to help with memory and behavior symptoms. Talk therapy. This provides education, emotional support, memory aids, and other ways of making up for problems with mental tasks. Lifestyle changes. These may include: Getting regular exercise. Eating a healthy diet that includes omega-3 fatty acids. Doing things to challenge your thinking and memory skills. Spending more time being with and talking to other people. Using routines like having regular times for meals and going to bed. Follow these instructions at home: Eating and drinking  Drink more fluids as told. Eat a healthy diet that includes omega-3 fatty acids. These can be found in: Fish. Nuts. Leafy vegetables. Vegetable oils. If you drink alcohol: Limit how much you have to: 0-1 drink a day if you're  male. 0-2 drinks a day if you're male. Know how much alcohol is in your drink. In the [place] one drink is one 12 oz bottle of beer (355 mL), one 5 oz glass of wine (148 mL), or one 1 oz glass of hard liquor (44 mL). Lifestyle  Get regular exercise as told by your provider. Do not smoke, vape, or use nicotine or tobacco. Use healthy ways to manage stress. If you need help managing stress, ask your provider. Keep spending time with other people. Keep your mind active by doing activities you enjoy, like reading or playing games. Make sure you get good sleep at night. These tips can help: Try not to take naps during the day. Keep your bedroom dark and cool. Do not exercise in the few hours before you go to bed. Do not have foods or drinks with caffeine at night. General instructions Take medicines only as told. Your provider may tell you to avoid taking medicines that can affect thinking. These include some medicines for pain or sleeping. Work with your provider to find out: What things you need help with. What your safety needs are. Where to find more information [employer] on Aging: [email or web address] Contact a health care provider if: You have any new symptoms. Get help right away if: You have new confusion or your confusion gets worse. You act in ways that put you or your family in danger. This information is not intended to replace advice given to you by your health care provider. Make sure you discuss any questions you have with your health care provider. Document Revised: 06/13/2022 Document Reviewed: 06/13/2022 Elsevier Patient Education  2024 [employer].

## 2023-08-16 NOTE — Progress Notes (Signed)
 Provider:  Dedra Gores, MD  Primary Care Physician:  Kennyth Worth HERO, MD [address] [place] [address]     Referring Provider: Kennyth Worth HERO, Md [address] [place],  [place] [address]          Chief Complaint according to patient   Patient presents with:                HISTORY OF PRESENT ILLNESS:  Todd Harris is a 63 y.o. male patient who is here for new CPAP first  revisit on 08/16/2023 .   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  Follow-up after remote stroke, and OSA on CPAP.  The patient is known to have a cavernous hemangioma.  He used to be on Depkaote, now on Lamcital   He endorsed today the Epworth Sleepiness Scale at 6 points, the fatigue severity scale at 24 points and the geriatric depression scale at 3 out of 15 points which is below the clinical indication of depression.  I looked through his current medications he also uses a continuous glucose sensor libre 3 freestyle, as needed Valium , he has Atrovent , Lamictal  as a seizure medicine Inderal  as needed for palpitations Mounjaro  has been prescribed though not as needed for insomnia.   Lipitor and Wellbutrin  are also listed. I am not sure if Wellbutrin  is safe in his  seizure condition. Vertigo resolved after he d/c Vrylar.  Paroxysmal atrial fibrillation but not anticoagulated.    $-09-2022 ; CLINICAL INFORMATION/HISTORY: Cavernous Hemangiomas,right pons ICH secondary to cavernous hemangioma in 09/2016 s/p gamma knife with residual left sided sensory impairment, ataxia and vertigo and hx of seizures on lamotrigine .   Repeat MRI brain 09/2020 for worsening vertigo showed stable appearance of cerebral cavernous angioma, worsening vertigo improved after PT. Complaints of dizziness that still remains. DME [employer] states machine is working well , set up  12/2015.   Continues to do well with CPAP.  Review of compliance report over the past 30 days shows 30 out of 30 usage days with 30 days greater than 4 hours for 100% compliance. Average usage 8 hours and 50 minutes.  Residual AHI 0.4 on set pressure of 13.  Leaks in the 95th percentile 18.1.  Continues to tolerate well.  Routinely follows with DME company [employer] [facility]. He is due for a new machine, interested in starting this process.    Of note, he has since been diagnosed with atrial fibrillation since prior visit.     Calculated pAHI (per hour): 27.1/h                           REM pAHI: 25.3/h                                               NREM pAHI:   27.5/h                           Supine AHI: The patient mostly slept in right lateral with an AHI of 28.8/h followed by left lateral sleep with an AHI of 18.3/h.  Supine sleep presented with an elevated AHI of  35.6/h.  There was however no position so not associated with significant apnea.   Snoring level reached a mean volume of 41 dB and was present for 13% of sleep time.        This HST confirms the presence of moderate to severe sleep apnea without REM sleep dependence and without significant hypoxemia.  Snoring was also mild to moderate.   Positional component exacerbated the AHI in supine sleep but avoiding supine sleep alone would not treat this apnea.  I therefore agree to replace and renew his CPAP prescription.   RECOMMENDATION: Auto titration CPAP with a humidified ResMed device, providing a setting between 5 and 16 cm water pressure with 3 cm EPR.   I do not have information about his mask or interface type or size.  Please advise patient to bring the current mask with him.                                              Review of Systems: Out of a complete 14 system review, the patient complains of only the following symptoms, and all other reviewed systems are negative.:   SLEEPINESS ?  How likely are you to doze in the  following situations: 0 = not likely, 1 = slight chance, 2 = moderate chance, 3 = high chance  Sitting and Reading? Watching Television? Sitting inactive in a public place (theater or meeting)? Lying down in the afternoon when circumstances permit? Sitting and talking to someone? Sitting quietly after lunch without alcohol? In a car, while stopped for a few minutes in traffic? As a passenger in a car for an hour without a break?  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    Married. 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      Caffeine use: 2 cup coffee per day   Social Drivers of Health   Financial Resource Strain: Low Risk  (07/03/2023)   Overall Financial Resource Strain (CARDIA)    Difficulty of Paying Living Expenses: Not hard at all  Food Insecurity: No Food Insecurity (07/03/2023)   Hunger Vital Sign    Worried About Running Out of Food in the Last Year: Never true    Ran Out of Food in the Last Year: Never true  Transportation Needs: Unmet Transportation Needs (07/03/2023)   PRAPARE - Transportation  Lack of Transportation (Medical): Yes    Lack of  Transportation (Non-Medical): No  Physical Activity: Inactive (07/03/2023)   Exercise Vital Sign    Days of Exercise per Week: 0 days    Minutes of Exercise per Session: Not on file  Stress: Stress Concern Present (07/03/2023)   [employer] of Occupational Health - Occupational Stress Questionnaire    Feeling of Stress: To some extent  Social Connections: Socially Integrated (07/03/2023)   Social Connection and Isolation Panel    Frequency of Communication with Friends and Family: More than three times a week    Frequency of Social Gatherings with Friends and Family: Once a week    Attends Religious Services: More than 4 times per year    Active Member of Clubs or Organizations: Yes    Attends Engineer, structural: More than 4 times per year    Marital Status: Married    Family History  Problem Relation Age of Onset   Anxiety disorder Mother    Depression Mother    Bipolar disorder Mother    Asthma Mother    Diabetes Mother    Hypertension Mother    Pancreatic cancer Father    Diabetes Father    Cancer Father        Pancreatic   Diabetes Brother    Diabetes Brother    Bipolar disorder Maternal Grandmother    Aneurysm Maternal Grandmother    Diabetes Paternal Grandmother    Diabetes Paternal Grandfather    Colon cancer Neg Hx    Colon polyps Neg Hx    Stomach cancer Neg Hx    Rectal cancer Neg Hx    Esophageal cancer Neg Hx     Past Medical History:  Diagnosis Date   Anxiety    Atrial arrhythmia    Barrett esophagus    Bipolar 1 disorder (HCC)    Cavernous angioma    Chronic kidney disease    Depression    Diabetes mellitus without complication (HCC)    GERD (gastroesophageal reflux disease)    History of chicken pox    Hypertension    Seizures (HCC)    1 in 2005, 1 in 2016    Sleep apnea    Stroke (HCC) 11/2014   No residual deficits   Venous malformation    Brain    Past Surgical History:  Procedure Laterality Date   APPENDECTOMY  1970    age 22   bone spur     age 47   kidney stones     RAD ONC MR BRAIN GAMMA KNIFE  12/05/2016   TONSILLECTOMY  1968   UMBILICAL HERNIA REPAIR N/A 07/24/2018   Procedure: HERNIA REPAIR UMBILICAL ADULT;  Surgeon: Aron Shoulders, MD;  Location: [facility];  Service: General;  Laterality: N/A;     Current Outpatient Medications on File Prior to Visit  Medication Sig Dispense Refill   atorvastatin  (LIPITOR) 40 MG tablet TAKE 1 TABLET BY MOUTH EVERY DAY 90 tablet 1   buPROPion  (WELLBUTRIN  XL) 300 MG 24 hr tablet TAKE 1 TABLET BY MOUTH EVERY DAY 90 tablet 1   Continuous Glucose Sensor (FREESTYLE LIBRE 3 SENSOR) MISC Place 1 sensor on the skin Q14 days. Use to check glucose continuously 2 each 5   diazepam  (VALIUM ) 5 MG tablet TAKE 1 TABLET BY MOUTH TWICE A DAY (Patient taking differently: Take 5 mg by mouth as needed.) 180 tablet 0   icosapent  Ethyl (VASCEPA ) 1 g capsule TAKE 2 CAPSULES BY  MOUTH TWICE A DAY 360 capsule 3   ipratropium (ATROVENT ) 0.03 % nasal spray INSTILL 2 SPRAYS INTO BOTH NOSTRILS EVERY 12 HOURS. 30 mL 1   lamoTRIgine  (LAMICTAL ) 100 MG tablet Take 1 tablet (100 mg total) by mouth 2 (two) times daily. 180 tablet 3   lisinopril -hydrochlorothiazide  (ZESTORETIC ) 20-25 MG tablet TAKE 1 TABLET BY MOUTH EVERY DAY 90 tablet 1   propranolol  (INDERAL ) 10 MG tablet TAKE 1 TABLET BY MOUTH UP TO FOUR TIMES DAILY AS NEEDED FOR PALPITATIONS/SVT 360 tablet 2   tirzepatide  (MOUNJARO ) 2.5 MG/0.5ML Pen INJECT 2.5 MG SUBCUTANEOUSLY WEEKLY 2 mL 0   zaleplon  (SONATA ) 5 MG capsule TAKE 1 CAPSULE (5 MG TOTAL) BY MOUTH AT BEDTIME AS NEEDED FOR SLEEP. FOR SLEEP 30 capsule 0   cariprazine  (VRAYLAR ) 3 MG capsule Take 1 capsule (3 mg total) by mouth daily. (Patient not taking: Reported on 08/16/2023) 90 capsule 1   No current facility-administered medications on file prior to visit.    No Known Allergies   DIAGNOSTIC DATA (LABS, IMAGING, TESTING) - I reviewed patient records, labs, notes, testing and imaging  myself where available.  Lab Results  Component Value Date   WBC 8.1 11/29/2022   HGB 16.0 11/29/2022   HCT 47.9 11/29/2022   MCV 92.3 11/29/2022   PLT 199.0 11/29/2022      Component Value Date/Time   NA 140 11/29/2022 1024   NA 142 03/08/2022 0849   K 4.5 11/29/2022 1024   CL 103 11/29/2022 1024   CO2 28 11/29/2022 1024   GLUCOSE 96 11/29/2022 1024   BUN 24 (H) 11/29/2022 1024   BUN 22 03/08/2022 0849   CREATININE 1.02 11/29/2022 1024   CREATININE 0.99 05/02/2019 1536   CALCIUM  10.0 11/29/2022 1024   PROT 6.4 11/29/2022 1024   PROT 6.6 03/08/2022 0849   ALBUMIN 4.7 11/29/2022 1024   ALBUMIN 4.8 03/08/2022 0849   AST 35 11/29/2022 1024   ALT 69 (H) 11/29/2022 1024   ALKPHOS 65 11/29/2022 1024   BILITOT 0.7 11/29/2022 1024   BILITOT 0.5 03/08/2022 0849   GFRNONAA >60 09/24/2021 2151   GFRAA >60 07/25/2018 0359   Lab Results  Component Value Date   CHOL 122 11/29/2022   HDL 38.60 (L) 11/29/2022   LDLCALC 55 11/29/2022   LDLDIRECT 58.0 07/04/2019   TRIG 138.0 11/29/2022   CHOLHDL 3 11/29/2022   Lab Results  Component Value Date   HGBA1C 5.7 (A) 07/05/2023   No results found for: VITAMINB12 Lab Results  Component Value Date   TSH 1.13 11/29/2022    PHYSICAL EXAM:  Vitals:   08/16/23 1555  BP: 111/68  Pulse: 69   No data found. Body mass index is 30.72 kg/m.   Wt Readings from Last 3 Encounters:  08/16/23 208 lb (94.3 kg)  07/05/23 214 lb 6.4 oz (97.3 kg)  03/29/23 208 lb (94.3 kg)     Ht Readings from Last 3 Encounters:  08/16/23 5' 9 (1.753 m)  07/05/23 5' 9.5 (1.765 m)  03/29/23 5' 9.5 (1.765 m)      General: The patient is awake, alert and appears not in acute distress and groomed. Head: Normocephalic, atraumatic.  Neck is supple.    NEUROLOGIC EXAM: The patient is awake and alert, oriented to place and time.   Memory subjective described as intact.  Attention span & concentration ability appears normal.   Speech is fluent,   without  dysarthria, dysphonia or aphasia.  Mood and affect are appropriate.  Neurological Examination: Mental Status: Intact. Language and speech are normal.reports subjective  cognitive deficits, names and words.  Cranial Nerves II-XII: Intact.  PERL. EOMI. VFF. No nystagmus.  No facial droop.  No ptosis.  Hearing is grossly intact bilaterally.  The tongue is normal and midline. Motor: mil d left weakness from Cavernous hemangioma bleed.  Coordination: No ataxia or dysmetria.  Sensory: Grossly intact throughout to all modalities. Reflexes: Normal and symmetric throughout. No ankle clonus. Babinski's sign is absent bilaterally. Hoffman's sign is absent bilaterally. Gait and Station: Normal. Romberg's sign is absent.   ASSESSMENT AND PLAN :    31 - year old male  MBA  here with:    1) Mr. Rosabel is doing excellent with his new CPAP machine and the settings between 5 and 16 cm water with 3 cm expiratory relief.  He is of course allowed to use a mask of his comfort, he was not allowed snoring therefore it is not necessary to use a fullface mask in my opinion.  The patient did not have significant hypoxia or bradycardia, the goal would be to try to stay off the back as a sleep position that exacerbates apnea in his case so when sleeping on his sides his apnea index is 18 and when sleeping on his back his apnea index was 36.  He mentioned at the ned of our visit some concerns about memory.     2) the 95 % percentile pressure was 10 cm water which is right in the middle of his current window.  We do not need to change the settings.  3) the patient had a concern about vertigo about 2-1/2 months ago, this has resolved after he was taken off of Vrylar , so it seems that the medication caused at least lightheadedness.  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  This condition has been known for years.  He used to  be on Depakote and was switched to Lamictal  which is provided by our office.    Rv in 12 month with Harlene Bogaert., NP     I would like to thank Kennyth Worth HERO, MD and Kennyth Worth HERO, Md [address] [place],  [place] [address] for allowing me to meet with this pleasant patient.   Sleep Clinic Patients are generally offered input on sleep hygiene, life style changes and how to improve compliance with medical treatment where applicable. Review and reiteration of good sleep hygiene measures is offered to any sleep clinic patient, be it in the first consultation or with any follow up visits.    Any patient with sleepiness should be cautioned not to drive, work at heights, or operate dangerous or heavy equipment when feeling tired or sleepy.      The patient will be seen in follow-up in the sleep clinic at [facility] for discussion of test results, sleep related symptoms and treatment compliance review, further management strategies, etc.   The referring provider will be notified of the test results.   The patient's condition requires frequent monitoring and adjustments in the treatment plan, reflecting the ongoing complexity of care.  This provider is the continuing focal point for all needed services for this condition.  After spending a total time of  21  minutes face to face and time for  history taking, physical and neurologic examination, review of laboratory studies,  personal review of imaging studies, reports and results of other  testing and review of referral information / records as far as provided in visit,   Electronically signed by: Dedra Gores, MD 08/16/2023 4:15 PM  [facility] Board certified by The [employer] of Sleep Medicine and Diplomate of the [employer] of Sleep Medicine. Board certified In Neurology through the ABPN, Fellow of the [employer] of Neurology.

## 2023-08-26 DIAGNOSIS — G4733 Obstructive sleep apnea (adult) (pediatric): Secondary | ICD-10-CM | POA: Diagnosis not present

## 2023-09-04 ENCOUNTER — Other Ambulatory Visit: Payer: Self-pay | Admitting: Family Medicine

## 2023-09-04 DIAGNOSIS — I10 Essential (primary) hypertension: Secondary | ICD-10-CM

## 2023-09-08 ENCOUNTER — Other Ambulatory Visit: Payer: Self-pay | Admitting: Family Medicine

## 2023-09-26 DIAGNOSIS — G4733 Obstructive sleep apnea (adult) (pediatric): Secondary | ICD-10-CM | POA: Diagnosis not present

## 2023-10-07 ENCOUNTER — Other Ambulatory Visit: Payer: Self-pay | Admitting: Family Medicine

## 2023-10-07 DIAGNOSIS — E785 Hyperlipidemia, unspecified: Secondary | ICD-10-CM

## 2023-10-08 ENCOUNTER — Other Ambulatory Visit: Payer: Self-pay | Admitting: Family Medicine

## 2023-10-08 ENCOUNTER — Ambulatory Visit: Admitting: Family Medicine

## 2023-10-08 ENCOUNTER — Encounter: Payer: Self-pay | Admitting: Family Medicine

## 2023-10-08 VITALS — BP 112/80 | HR 67 | Temp 97.5°F | Ht 69.0 in | Wt 202.8 lb

## 2023-10-08 DIAGNOSIS — I152 Hypertension secondary to endocrine disorders: Secondary | ICD-10-CM | POA: Diagnosis not present

## 2023-10-08 DIAGNOSIS — E1159 Type 2 diabetes mellitus with other circulatory complications: Secondary | ICD-10-CM

## 2023-10-08 DIAGNOSIS — Z7985 Long-term (current) use of injectable non-insulin antidiabetic drugs: Secondary | ICD-10-CM

## 2023-10-08 DIAGNOSIS — E1165 Type 2 diabetes mellitus with hyperglycemia: Secondary | ICD-10-CM | POA: Diagnosis not present

## 2023-10-08 LAB — POCT GLYCOSYLATED HEMOGLOBIN (HGB A1C): Hemoglobin A1C: 5.6 % (ref 4.0–5.6)

## 2023-10-08 MED ORDER — TIRZEPATIDE 5 MG/0.5ML ~~LOC~~ SOAJ: 5.0000 mg | SUBCUTANEOUS | 0 refills | Status: DC

## 2023-10-08 NOTE — Patient Instructions (Signed)
 It was very nice to see you today!  VISIT SUMMARY: Today, we reviewed your diabetes management, knee pain, and general health maintenance. Your diabetes is well-controlled, and you are making good progress with your weight loss goals. We also discussed your recent cold and plans for flu and pneumonia vaccinations.  YOUR PLAN: TYPE 2 DIABETES MELLITUS WITH HYPERGLYCEMIA, HYPERTENSION, AND DYSLIPIDEMIA: Your diabetes is well-controlled with an A1c of 5.6, and you have lost 12 pounds. Your blood pressure is also controlled, and there are no issues with cholesterol. -Increase Mounjaro  dose to 5 mg. -Continue using the continuous glucose monitor (CGM) to track your blood sugar levels. -Keep up with your diet and exercise regimen to support further weight loss.  GENERAL HEALTH MAINTENANCE: You are recovering from a cold and have decided to wait on flu and pneumonia vaccinations until you are fully recovered. -Discuss flu and pneumonia vaccinations at your next visit.  Return in about 6 months (around 04/07/2024) for Annual Physical.   Take care, Dr Kennyth  PLEASE NOTE:  If you had any lab tests, please let us  know if you have not heard back within a few days. You may see your results on mychart before we have a chance to review them but we will give you a call once they are reviewed by us .   If we ordered any referrals today, please let us  know if you have not heard from their office within the next week.   If you had any urgent prescriptions sent in today, please check with the pharmacy within an hour of our visit to make sure the prescription was transmitted appropriately.   Please try these tips to maintain a healthy lifestyle:  Eat at least 3 REAL meals and 1-2 snacks per day.  Aim for no more than 5 hours between eating.  If you eat breakfast, please do so within one hour of getting up.   Each meal should contain half fruits/vegetables, one quarter protein, and one quarter carbs (no  bigger than a computer mouse)  Cut down on sweet beverages. This includes juice, soda, and sweet tea.   Drink at least 1 glass of water with each meal and aim for at least 8 glasses per day  Exercise at least 150 minutes every week.

## 2023-10-08 NOTE — Assessment & Plan Note (Signed)
 A1c well-controlled 5.6.  He is doing well with Mounjaro .  Will increase dose to 5 mg weekly.  He is working on lifestyle interventions.  Recheck A1c in 6 months.

## 2023-10-08 NOTE — Assessment & Plan Note (Signed)
 At goal today on lisinopril  HCTZ 20-25 once daily.

## 2023-10-08 NOTE — Progress Notes (Signed)
   Todd Harris is a 63 y.o. male who presents today for an office visit.  Assessment/Plan:   Assessment & Plan Knee pain with difficulty rising from squatting   Intermittent knee pain improves with Osteo Bi-Flex. He is not interested in physical therapy. Continue Osteo Bi-Flex with turmeric as needed.  General Health Maintenance   He is recovering from a cold, and flu and pneumonia vaccinations were deferred. Discuss flu and pneumonia vaccinations at the next visit.     Chronic Problems Addressed Today: Type 2 diabetes mellitus with hyperglycemia (HCC) A1c well-controlled 5.6.  He is doing well with Mounjaro .  Will increase dose to 5 mg weekly.  He is working on lifestyle interventions.  Recheck A1c in 6 months.  Hypertension associated with diabetes (HCC) At goal today on lisinopril  HCTZ 20/25 once daily.     Subjective:  HPI:  See assessment / plan for status of chronic conditions.   Discussed the use of AI scribe software for clinical note transcription with the patient, who gave verbal consent to proceed.  History of Present Illness Todd Harris is a 63 year old male with diabetes who presents for follow-up on his blood sugar management.  He is managing his diabetes with Mounjaro , which he restarted a few months ago. He is currently on a dose of 0.25 mg and is considering increasing it to 0.5 mg as he is doing well with the current dose and has experienced no side effects. He has started using a continuous glucose monitor (CGM), and his blood sugar levels remain stable, with only a slight increase of 10 to 15 points after meals. His recent A1c is 5.6. He has lost about 12 pounds since his last visit and aims to reach a weight of 185 pounds, currently weighing 197 pounds. He is actively working on his diet and exercise regimen.  He recently had a cold, which he attributes to his wife, who is still experiencing symptoms. He is recovering and has decided to hold off on receiving  a flu shot and pneumonia vaccine until he is fully recovered.  He mentions difficulty bending down and getting back up, which he experienced for the first time about three months ago. This issue was particularly noticeable when he had to squat to access a whole house water filter. He tried Osteo Bi-Flex with turmeric, which helped after about three doses. He describes the initial experience as 'really weird' and had to crawl to get back up. He has a history of stroke and previously saw a physical therapist for related issues, but he feels fine now and does not express interest in further physical therapy.         Objective:  Physical Exam: BP 112/80   Pulse 67   Temp (!) 97.5 F (36.4 C) (Temporal)   Ht 5' 9 (1.753 m)   Wt 202 lb 12.8 oz (92 kg)   SpO2 96%   BMI 29.95 kg/m   Wt Readings from Last 3 Encounters:  10/08/23 202 lb 12.8 oz (92 kg)  08/16/23 208 lb (94.3 kg)  07/05/23 214 lb 6.4 oz (97.3 kg)    Gen: No acute distress, resting comfortably CV: Regular rate and rhythm with no murmurs appreciated Pulm: Normal work of breathing, clear to auscultation bilaterally with no crackles, wheezes, or rhonchi Neuro: Grossly normal, moves all extremities Psych: Normal affect and thought content      Harmoni Lucus M. Kennyth, MD 10/08/2023 2:49 PM

## 2023-10-09 NOTE — Telephone Encounter (Signed)
 See note

## 2023-10-09 NOTE — Telephone Encounter (Signed)
 We discussed this yesterday

## 2023-10-12 ENCOUNTER — Ambulatory Visit: Admitting: Behavioral Health

## 2023-10-12 ENCOUNTER — Encounter: Payer: Self-pay | Admitting: Behavioral Health

## 2023-10-12 DIAGNOSIS — F411 Generalized anxiety disorder: Secondary | ICD-10-CM | POA: Diagnosis not present

## 2023-10-12 DIAGNOSIS — F3132 Bipolar disorder, current episode depressed, moderate: Secondary | ICD-10-CM | POA: Diagnosis not present

## 2023-10-12 DIAGNOSIS — F319 Bipolar disorder, unspecified: Secondary | ICD-10-CM

## 2023-10-12 MED ORDER — BUPROPION HCL ER (XL) 300 MG PO TB24: ORAL_TABLET | ORAL | 1 refills | Status: AC

## 2023-10-12 MED ORDER — CARIPRAZINE HCL 3 MG PO CAPS: 3.0000 mg | ORAL_CAPSULE | Freq: Every day | ORAL | 1 refills | Status: AC

## 2023-10-12 NOTE — Progress Notes (Signed)
 [facility]  Patient ID: Todd Harris,  MRN: [id number]  PCP: Kennyth Worth HERO, MD  Date of Evaluation: 10/12/2023 Time spent:30 minutes  Chief Complaint:  Chief Complaint   Anxiety; Depression; Follow-up; Medication Refill; Patient Education     HISTORY/CURRENT STATUS: HPI 63 year old male presents to this office for follow up and medication management. Continued great stability.  No psychosocial changes this visit.  Says he has been doing very well.  Reports using Valium  sporadically as necessary.  Still traveling with his job. Says his anxiety and depression have been well controlled.  Patient does report attempting to come off the medication recently and went cold [place].  After 3 weeks he did not feel good and restarted the medication.  He is feeling very well now and says that he would like to continue with current dose.  He is now requesting 1 year follow-ups. He reports anxiety today 1/10 and depression 0/10. Says he sleeps 7 hours per night. Reports no mania. No psychosis. No SI/HI.         Individual Medical History/ Review of Systems: Changes? :No   Allergies: Patient has no known allergies.  Current Medications:  Current Outpatient Medications:    cariprazine  (VRAYLAR ) 3 MG capsule, Take 1 capsule (3 mg total) by mouth daily., Disp: 90 capsule, Rfl: 1   atorvastatin  (LIPITOR) 40 MG tablet, TAKE 1 TABLET BY MOUTH EVERY DAY, Disp: 90 tablet, Rfl: 1   buPROPion  (WELLBUTRIN  XL) 300 MG 24 hr tablet, TAKE 1 TABLET BY MOUTH EVERY DAY, Disp: 90 tablet, Rfl: 1   Continuous Glucose Sensor (FREESTYLE LIBRE 3 SENSOR) MISC, Place 1 sensor on the skin Q14 days. Use to check glucose continuously, Disp: 2 each, Rfl: 5   diazepam  (VALIUM ) 5 MG tablet, TAKE 1 TABLET BY MOUTH TWICE A DAY (Patient taking differently: Take 5 mg by mouth as needed.), Disp: 180 tablet, Rfl: 0   icosapent  Ethyl (VASCEPA ) 1 g capsule, TAKE 2 CAPSULES BY MOUTH TWICE A DAY, Disp: 360 capsule, Rfl: 3    ipratropium (ATROVENT ) 0.03 % nasal spray, INSTILL 2 SPRAYS INTO BOTH NOSTRILS EVERY 12 HOURS., Disp: 30 mL, Rfl: 1   lamoTRIgine  (LAMICTAL ) 100 MG tablet, Take 1 tablet (100 mg total) by mouth 2 (two) times daily., Disp: 180 tablet, Rfl: 3   lisinopril -hydrochlorothiazide  (ZESTORETIC ) 20-25 MG tablet, TAKE 1 TABLET BY MOUTH EVERY DAY, Disp: 90 tablet, Rfl: 1   propranolol  (INDERAL ) 10 MG tablet, TAKE 1 TABLET BY MOUTH UP TO FOUR TIMES DAILY AS NEEDED FOR PALPITATIONS/SVT, Disp: 360 tablet, Rfl: 2   tirzepatide  (MOUNJARO ) 5 MG/0.5ML Pen, Inject 5 mg into the skin once a week., Disp: 6 mL, Rfl: 0   zaleplon  (SONATA ) 5 MG capsule, TAKE 1 CAPSULE (5 MG TOTAL) BY MOUTH AT BEDTIME AS NEEDED FOR SLEEP. FOR SLEEP, Disp: 30 capsule, Rfl: 0 Medication Side Effects: none  Family Medical/ Social History: Changes? No  MENTAL HEALTH EXAM:  There were no vitals taken for this visit.There is no height or weight on file to calculate BMI.  General Appearance: Casual, Neat, and Well Groomed  Eye Contact:  Good  Speech:  Clear and Coherent  Volume:  Normal  Mood:  NA  Affect:  Appropriate  Thought Process:  Coherent  Orientation:  Full (Time, Place, and Person)  Thought Content: Logical   Suicidal Thoughts:  No  Homicidal Thoughts:  No  Memory:  WNL  Judgement:  Good  Insight:  Good  Psychomotor Activity:  Normal  Concentration:  Concentration: Good  Recall:  Good  Fund of Knowledge: Good  Language: Good  Assets:  Desire for Improvement  ADL's:  Intact  Cognition: WNL  Prognosis:  Good    DIAGNOSES:    ICD-10-CM   1. GAD (generalized anxiety disorder)  F41.1 buPROPion  (WELLBUTRIN  XL) 300 MG 24 hr tablet    cariprazine  (VRAYLAR ) 3 MG capsule    2. Bipolar I disorder (HCC)  F31.9 cariprazine  (VRAYLAR ) 3 MG capsule    3. Bipolar I disorder, moderate, current or most recent episode depressed, with anxious distress (HCC)  F31.32 buPROPion  (WELLBUTRIN  XL) 300 MG 24 hr tablet      Receiving  Psychotherapy: No    RECOMMENDATIONS:  Greater than 50% of 30 min face to face time with patient was spent on counseling and coordination of care. We discussed his current stability with bipolar disorder. He has not experienced any manic or depressive episodes since last visit.  We talked about him stopping his medication recently going cold [place].  He restarted after 3 weeks and now feels much better.  He agrees to take medication as prescribed.  He is also requesting 1 year follow-ups but agrees to notify this office with any negative changes.  He agrees to continue to follow-up with PCP and specialist regularly. Discussed potential benefits, risk, and side effects of benzodiazepines to include potential risk of tolerance and dependence, as well as possible drowsiness.  Advised patient not to drive if experiencing drowsiness and to take lowest possible effective dose to minimize risk of dependence and tolerance.  Patient will be traveling out of country extensively for job and request 12 month follow up.  Reviewed patients most recent labs and nothing of concern this visit.      Continue Vraylar  to 3 mg daily Continue Wellbutrin  300 mg XL daily Continue Valium   5 mg tablet twice daily Continue Lamictal  100 mg twice daily Follow up in 6 months per patient to reassess.  To report any worsening symptoms  or side effects promptly Monitor any increase in dizziness and notify if worsen PDMP reviewed.          Redell DELENA Pizza, NP

## 2023-10-18 ENCOUNTER — Emergency Department (HOSPITAL_BASED_OUTPATIENT_CLINIC_OR_DEPARTMENT_OTHER)
Admission: EM | Admit: 2023-10-18 | Discharge: 2023-10-18 | Disposition: A | Discharge status: Home / Self Care | Attending: Emergency Medicine | Admitting: Emergency Medicine

## 2023-10-18 ENCOUNTER — Encounter: Payer: Self-pay | Admitting: Family Medicine

## 2023-10-18 ENCOUNTER — Emergency Department (HOSPITAL_BASED_OUTPATIENT_CLINIC_OR_DEPARTMENT_OTHER): Admitting: Radiology

## 2023-10-18 ENCOUNTER — Telehealth

## 2023-10-18 ENCOUNTER — Encounter (HOSPITAL_BASED_OUTPATIENT_CLINIC_OR_DEPARTMENT_OTHER): Payer: Self-pay | Admitting: Emergency Medicine

## 2023-10-18 ENCOUNTER — Other Ambulatory Visit: Payer: Self-pay

## 2023-10-18 DIAGNOSIS — N189 Chronic kidney disease, unspecified: Secondary | ICD-10-CM | POA: Diagnosis not present

## 2023-10-18 DIAGNOSIS — Z7901 Long term (current) use of anticoagulants: Secondary | ICD-10-CM | POA: Diagnosis not present

## 2023-10-18 DIAGNOSIS — I4891 Unspecified atrial fibrillation: Secondary | ICD-10-CM | POA: Insufficient documentation

## 2023-10-18 DIAGNOSIS — I129 Hypertensive chronic kidney disease with stage 1 through stage 4 chronic kidney disease, or unspecified chronic kidney disease: Secondary | ICD-10-CM | POA: Insufficient documentation

## 2023-10-18 DIAGNOSIS — Z79899 Other long term (current) drug therapy: Secondary | ICD-10-CM | POA: Insufficient documentation

## 2023-10-18 DIAGNOSIS — E860 Dehydration: Secondary | ICD-10-CM | POA: Diagnosis not present

## 2023-10-18 DIAGNOSIS — R002 Palpitations: Secondary | ICD-10-CM

## 2023-10-18 DIAGNOSIS — R200 Anesthesia of skin: Secondary | ICD-10-CM | POA: Diagnosis not present

## 2023-10-18 DIAGNOSIS — R0789 Other chest pain: Secondary | ICD-10-CM | POA: Diagnosis not present

## 2023-10-18 DIAGNOSIS — E1122 Type 2 diabetes mellitus with diabetic chronic kidney disease: Secondary | ICD-10-CM | POA: Insufficient documentation

## 2023-10-18 DIAGNOSIS — Z8673 Personal history of transient ischemic attack (TIA), and cerebral infarction without residual deficits: Secondary | ICD-10-CM | POA: Insufficient documentation

## 2023-10-18 DIAGNOSIS — R0602 Shortness of breath: Secondary | ICD-10-CM | POA: Diagnosis not present

## 2023-10-18 LAB — CBC
HCT: 46.4 % (ref 39.0–52.0)
Hemoglobin: 16.1 g/dL (ref 13.0–17.0)
MCH: 30.7 pg (ref 26.0–34.0)
MCHC: 34.7 g/dL (ref 30.0–36.0)
MCV: 88.5 fL (ref 80.0–100.0)
Platelets: 225 K/uL (ref 150–400)
RBC: 5.24 MIL/uL (ref 4.22–5.81)
RDW: 12.9 % (ref 11.5–15.5)
WBC: 12.3 K/uL — ABNORMAL HIGH (ref 4.0–10.5)
nRBC: 0 % (ref 0.0–0.2)

## 2023-10-18 LAB — BASIC METABOLIC PANEL WITH GFR
Anion gap: 13 (ref 5–15)
BUN: 28 mg/dL — ABNORMAL HIGH (ref 8–23)
CO2: 23 mmol/L (ref 22–32)
Calcium: 10.3 mg/dL (ref 8.9–10.3)
Chloride: 101 mmol/L (ref 98–111)
Creatinine, Ser: 1.21 mg/dL (ref 0.61–1.24)
GFR, Estimated: >60 mL/min (ref >60)
Glucose, Bld: 114 mg/dL — ABNORMAL HIGH (ref 70–99)
Potassium: 4.1 mmol/L (ref 3.5–5.1)
Sodium: 137 mmol/L (ref 135–145)

## 2023-10-18 LAB — URINALYSIS, W/ REFLEX TO CULTURE (INFECTION SUSPECTED)
Bacteria, UA: NONE SEEN
Bilirubin Urine: NEGATIVE
Glucose, UA: NEGATIVE mg/dL
Hgb urine dipstick: NEGATIVE
Ketones, ur: NEGATIVE mg/dL
Leukocytes,Ua: NEGATIVE
Nitrite: NEGATIVE
Protein, ur: NEGATIVE mg/dL
Specific Gravity, Urine: 1.019 (ref 1.005–1.030)
pH: 6.5 (ref 5.0–8.0)

## 2023-10-18 LAB — MAGNESIUM: Magnesium: 1.8 mg/dL (ref 1.7–2.4)

## 2023-10-18 LAB — TSH: TSH: 1.88 u[IU]/mL (ref 0.350–4.500)

## 2023-10-18 LAB — TROPONIN T, HIGH SENSITIVITY
Troponin T High Sensitivity: <15 ng/L (ref 0–19)
Troponin T High Sensitivity: <15 ng/L (ref 0–19)

## 2023-10-18 MED ORDER — SODIUM CHLORIDE 0.9 % IV BOLUS
1000.0000 mL | Freq: Once | INTRAVENOUS | Status: AC
  Administered 2023-10-18: 1000 mL via INTRAVENOUS

## 2023-10-18 NOTE — ED Triage Notes (Signed)
 Pt c/o palpitations stating he has been in afib since last night, palpitations, SOB, and chest discomfort since approx 2130. PMH afib, takes propanolol, states he usually comes out of the afib after taking meds but this episode has lasted longer with no change after taking meds.

## 2023-10-18 NOTE — Telephone Encounter (Signed)
 Please advice

## 2023-10-18 NOTE — Discharge Instructions (Signed)
 Your history, exam, workup today seem consistent with intermittent atrial fibrillation in the setting of some dehydration that you were able to correct yourself with your medications.  Your blood pressure was on the softer side due to the dehydration and why suspect you have as well as the medication.  As you proven stability now for over 4 hours without going back into A-fib and have improvement in your lightheadedness and fatigue we feel you are safe for discharge home.  Your heart enzymes were negative as were the rest of your electrolytes overall.  We feel you are safe.  Please follow-up with your primary doctor and your cardiologist and if any symptoms change or worsen acutely, please return to the nearest emergency department.

## 2023-10-18 NOTE — ED Provider Notes (Signed)
 [place] EMERGENCY DEPARTMENT AT [facility] Provider Note   CSN: [id number] Arrival date & time: 10/18/23  1415     Patient presents with: Tachycardia and Palpitations   Todd Harris is a 63 y.o. male.   The history is provided by the patient and medical records. No language interpreter was used.  Palpitations Palpitations quality:  Fast Onset quality:  Sudden Duration: 9:30 pm. Chronicity:  Recurrent Relieved by:  Nothing Worsened by:  Nothing Ineffective treatments:  None tried Associated symptoms: chest pain, chest pressure, malaise/fatigue and shortness of breath   Associated symptoms: no back pain, no cough, no lower extremity edema, no nausea, no near-syncope, no numbness, no syncope and no vomiting   Risk factors: diabetes mellitus and hx of atrial fibrillation        Prior to Admission medications   Medication Sig Start Date End Date Taking? Authorizing Provider  atorvastatin  (LIPITOR) 40 MG tablet TAKE 1 TABLET BY MOUTH EVERY DAY 10/08/23   Kennyth Worth HERO, MD  buPROPion  (WELLBUTRIN  XL) 300 MG 24 hr tablet TAKE 1 TABLET BY MOUTH EVERY DAY 10/12/23   Teresa Rogue A, NP  cariprazine  (VRAYLAR ) 3 MG capsule Take 1 capsule (3 mg total) by mouth daily. 10/12/23   Teresa Rogue LABOR, NP  Continuous Glucose Sensor (FREESTYLE LIBRE 3 SENSOR) MISC Place 1 sensor on the skin Q14 days. Use to check glucose continuously 08/07/23   Kennyth Worth HERO, MD  diazepam  (VALIUM ) 5 MG tablet TAKE 1 TABLET BY MOUTH TWICE A DAY Patient taking differently: Take 5 mg by mouth as needed. 03/15/23   Teresa Rogue LABOR, NP  icosapent  Ethyl (VASCEPA ) 1 g capsule TAKE 2 CAPSULES BY MOUTH TWICE A DAY 10/02/22   Parker, Caleb M, MD  ipratropium (ATROVENT ) 0.03 % nasal spray INSTILL 2 SPRAYS INTO BOTH NOSTRILS EVERY 12 HOURS. 01/13/21   Kennyth Worth HERO, MD  lamoTRIgine  (LAMICTAL ) 100 MG tablet Take 1 tablet (100 mg total) by mouth 2 (two) times daily. 03/29/23   Whitfield Raisin, NP   lisinopril -hydrochlorothiazide  (ZESTORETIC ) 20-25 MG tablet TAKE 1 TABLET BY MOUTH EVERY DAY 09/04/23   Kennyth Worth HERO, MD  propranolol  (INDERAL ) 10 MG tablet TAKE 1 TABLET BY MOUTH UP TO FOUR TIMES DAILY AS NEEDED FOR PALPITATIONS/SVT 10/24/21   Nahser, Aleene PARAS, MD  tirzepatide  (MOUNJARO ) 5 MG/0.5ML Pen Inject 5 mg into the skin once a week. 10/08/23   Kennyth Worth HERO, MD  zaleplon  (SONATA ) 5 MG capsule TAKE 1 CAPSULE (5 MG TOTAL) BY MOUTH AT BEDTIME AS NEEDED FOR SLEEP. FOR SLEEP 12/14/22   Dohmeier, Dedra, MD    Allergies: Patient has no known allergies.    Review of Systems  Constitutional:  Positive for malaise/fatigue. Negative for chills, fatigue and fever.  HENT:  Negative for congestion.   Respiratory:  Positive for chest tightness and shortness of breath. Negative for cough.   Cardiovascular:  Positive for chest pain and palpitations. Negative for syncope and near-syncope.  Gastrointestinal:  Negative for abdominal pain, constipation, diarrhea, nausea and vomiting.  Genitourinary:  Negative for dysuria and flank pain.  Musculoskeletal:  Negative for back pain, neck pain and neck stiffness.  Skin:  Negative for rash and wound.  Neurological:  Positive for light-headedness. Negative for syncope, numbness and headaches.  Psychiatric/Behavioral:  Negative for agitation.   All other systems reviewed and are negative.   Updated Vital Signs BP 93/72   Pulse 75   Temp 97.9 F (36.6 C)   Resp 20  Ht 5' 9 (1.753 m)   Wt 88.5 kg   SpO2 97%   BMI 28.80 kg/m   Physical Exam Vitals and nursing note reviewed.  Constitutional:      General: He is not in acute distress.    Appearance: He is well-developed. He is not ill-appearing, toxic-appearing or diaphoretic.  HENT:     Head: Normocephalic and atraumatic.     Nose: No congestion or rhinorrhea.     Mouth/Throat:     Mouth: Mucous membranes are dry.     Pharynx: No oropharyngeal exudate or posterior oropharyngeal erythema.   Eyes:     Conjunctiva/sclera: Conjunctivae normal.     Pupils: Pupils are equal, round, and reactive to light.  Cardiovascular:     Rate and Rhythm: Normal rate and regular rhythm.     Pulses: Normal pulses.     Heart sounds: No murmur heard. Pulmonary:     Effort: Pulmonary effort is normal. No respiratory distress.     Breath sounds: Normal breath sounds. No wheezing, rhonchi or rales.  Chest:     Chest wall: No tenderness.  Abdominal:     General: Abdomen is flat.     Palpations: Abdomen is soft.     Tenderness: There is no abdominal tenderness.  Musculoskeletal:        General: No swelling.     Cervical back: Neck supple.     Right lower leg: No edema.     Left lower leg: No edema.  Skin:    General: Skin is warm and dry.     Capillary Refill: Capillary refill takes less than 2 seconds.     Findings: No erythema or rash.  Neurological:     Mental Status: He is alert. Mental status is at baseline.     Sensory: Sensory deficit (lef tsided numbness at baseline) present.  Psychiatric:        Mood and Affect: Mood normal.     (all labs ordered are listed, but only abnormal results are displayed) Labs Reviewed  BASIC METABOLIC PANEL WITH GFR - Abnormal; Notable for the following components:      Result Value   Glucose, Bld 114 (*)    BUN 28 (*)    All other components within normal limits  CBC - Abnormal; Notable for the following components:   WBC 12.3 (*)    All other components within normal limits  TSH  MAGNESIUM  URINALYSIS, W/ REFLEX TO CULTURE (INFECTION SUSPECTED)  TROPONIN T, HIGH SENSITIVITY  TROPONIN T, HIGH SENSITIVITY    EKG: EKG Interpretation Date/Time:  Thursday October 18 2023 14:47:01 EDT Ventricular Rate:  67 PR Interval:  184 QRS Duration:  103 QT Interval:  380 QTC Calculation: 402 R Axis:   -45  Text Interpretation: Sinus rhythm Left ventricular hypertrophy Inferior infarct, old Anterior Q waves, possibly due to LVH when compared to  afib with RVR earlier, now sinus. No STEMI Confirmed by Ginger Barefoot ([id number]) on 10/18/2023 3:04:11 PM  Radiology: DG Chest 2 View Result Date: 10/18/2023 EXAM: 2 VIEW(S) XRAY OF THE CHEST 10/18/2023 03:37:00 PM COMPARISON: 09/24/2021 CLINICAL HISTORY: Palpitations. Triage note: Pt c/o palpitations stating he has been in afib since last night, palpitations, SOB, and chest discomfort since approx 2130. PMH afib, takes propanolol, states he usually comes out of the afib after taking meds but this episode has lasted longer with no change after taking meds. FINDINGS: LUNGS AND PLEURA: No focal pulmonary opacity. No pulmonary edema. No pleural effusion.  No pneumothorax. HEART AND MEDIASTINUM: No acute abnormality of the cardiac and mediastinal silhouettes. BONES AND SOFT TISSUES: No acute osseous abnormality. IMPRESSION: 1. No acute cardiopulmonary abnormality. Electronically signed by: Lynwood Seip MD 10/18/2023 04:03 PM EDT RP Workstation: [id number]     Procedures   Medications Ordered in the ED  sodium chloride  0.9 % bolus 1,000 mL (0 mLs Intravenous Stopped 10/18/23 1651)                                    Medical Decision Making Amount and/or Complexity of Data Reviewed Labs: ordered. Radiology: ordered.    Todd Harris is a 63 y.o. male with a past medical history significant for previous pontine hemorrhagic stroke with residual left-sided numbness, diabetes, hypertension, CKD, GERD, seizures, bipolar disorder, anxiety, depression, and intermittent atrial fibrillation not on anticoagulation who presents with palpitations, chest discomfort, shortness of breath, fatigue, and lightheadedness.  According to patient, about 9:30 PM last night he felt himself going to A-fib and he is feeling palpitations that he confirmed with his smart watch.  He called his cardiologist who told him to take propranolol  which he took at 930, about 230 morning, and then early this morning.  He reports he was  still feeling the palpitations with some left chest tightness and shortness of breath but otherwise did not have nausea, vomiting, diaphoresis, or syncope.  He felt slightly fatigued and lightheaded as he was feeling the palpitations.  He came to the emergency department still in A-fib but then when he was getting an IV placed he felt himself go back in a sinus rhythm.  He says he cannot take blood thinners due to the hemorrhagic stroke.  He says otherwise he is doing fairly well and denies other precipitating factors such  significant increase in caffeine, missing sleep, stress, or other problems.  Patient otherwise denies recent fevers, chills, congestion, cough, nausea, vomiting, constipation, diarrhea, or urinary changes.  Denies leg pain or leg swelling.  Otherwise he was feeling well.  He is still feeling slightly fatigued but the chest discomfort palpitations or shortness of breath has resolved now he is back in sinus rhythm.  On my exam, lungs are clear.  Chest nontender.  Abdomen nontender.  He has intact pulse in extremities.  Left side is numb compared to the right which he reports unchanged from baseline.  No other neurologic deficits reported.  No murmur on my exam.  Good pulses.  Patient resting.  EKG shows sinus rhythm now from A-fib earlier.  His blood pressure is in the 80s so we will give him some fluids as his mouth is dry and I do suspect some dehydration.  Will check electrolytes and look for occult infection with a chest x-ray and pulses.  If he remains in sinus rhythm, blood pressure improves and his workup is otherwise reassuring, anticipate discharge home with PCP and cardiology follow-up.  Patient agrees.  Anticipate reassessment after workup.  6:27 PM Patient has now proven stability and blood pressure has improved.  He is only feels lightheaded and he would like to go home.  He does not want to be admitted.  His rhythm has remained in sinus rhythm on the monitor and his  workup was reassuring with normal troponin x 2.  X-ray did not show pneumonia and patient's workup otherwise reassuring.  TSH was normal and his urinalysis did not show infection.  Will discharge for  outpatient follow-up.        Final diagnoses:  Palpitations  Atrial fibrillation, unspecified type (HCC)  Dehydration    ED Discharge Orders     None       Clinical Impression: 1. Palpitations   2. Atrial fibrillation, unspecified type (HCC)   3. Dehydration     Disposition: Discharge  Condition: Good  I have discussed the results, Dx and Tx plan with the pt(& family if present). He/she/they expressed understanding and agree(s) with the plan. Discharge instructions discussed at great length. Strict return precautions discussed and pt &/or family have verbalized understanding of the instructions. No further questions at time of discharge.    New Prescriptions   No medications on file    Follow Up: Kennyth Worth HERO, MD [address] [place] [address] [phone]     [facility] Emergency Department at [facility] [address] [place]  [id number] [phone]    your cardiology team        Jone Panebianco, Lonni PARAS, MD 10/18/23 [phone]

## 2023-10-18 NOTE — ED Notes (Signed)
 Patient transported to X-ray

## 2023-10-19 NOTE — Telephone Encounter (Signed)
 Patient was at ER yesterday

## 2023-10-19 NOTE — Telephone Encounter (Signed)
 He should be checked out ASAP either here or in the Emergency Department.  In the future, recommend he call the office to talk to a nurse for urgent issues such as this.   Worth HERO. Kennyth, MD 10/19/2023 7:29 AM

## 2023-10-24 NOTE — Telephone Encounter (Signed)
 See note

## 2023-10-24 NOTE — Telephone Encounter (Signed)
 Yes I think he will following up with cardiology is a good idea.  I believe Dr. Alveta may have retired. He should be able to call to schedule an appoint with a new cardiologist or we can place a referral if needed.

## 2023-10-25 ENCOUNTER — Other Ambulatory Visit: Payer: Self-pay | Admitting: *Deleted

## 2023-10-25 DIAGNOSIS — I4891 Unspecified atrial fibrillation: Secondary | ICD-10-CM

## 2023-10-25 NOTE — Telephone Encounter (Signed)
 Referral to cardiology placed.

## 2023-10-30 ENCOUNTER — Encounter: Payer: Self-pay | Admitting: Family Medicine

## 2023-11-05 ENCOUNTER — Encounter: Payer: Self-pay | Admitting: Radiology

## 2023-11-09 ENCOUNTER — Encounter: Payer: Self-pay | Admitting: Emergency Medicine

## 2023-11-09 ENCOUNTER — Ambulatory Visit: Attending: Emergency Medicine | Admitting: Emergency Medicine

## 2023-11-09 VITALS — BP 130/82 | HR 59 | Resp 16 | Ht 69.0 in | Wt 196.2 lb

## 2023-11-09 DIAGNOSIS — I4891 Unspecified atrial fibrillation: Secondary | ICD-10-CM

## 2023-11-09 MED ORDER — METOPROLOL SUCCINATE ER 25 MG PO TB24: 12.5000 mg | ORAL_TABLET | Freq: Every day | ORAL | 1 refills | Status: AC

## 2023-11-09 NOTE — Progress Notes (Signed)
 Cardiology Office Note:    Date:  11/09/2023  ID:  Oneil FORBES Litter, DOB [birth year], MRN [id number] PCP: Kennyth Worth HERO, MD  [facility] Providers Cardiologist:  Emeline FORBES Calender, MD Cardiology APP:  Rana Lum CROME, NP       Patient Profile:       Chief Complaint: Acute visit for atrial fibrillation History of Present Illness:  JAMERIUS BOECKMAN is a 63 y.o. male with visit-pertinent history of vertigo, pontine hemorrhagic stroke, diabetes, hypertension, CKD, GERD, seizures, bipolar disorder, atrial fibrillation not on anticoagulation  Patient established with cardiology service on 06/17/2021 for evaluation of dizziness.  He has history of pontine hemorrhagic stroke  from a cavernous hemangioma. He was thought to have exercise-induced vertigo.  He underwent echocardiogram on 07/14/2021 with LVEF 60 to 65%, no RWMA, diastolic parameters indeterminate, RV function and size normal, normal PASP, no valvular abnormalities.  He underwent exercise tolerance test on 07/14/2021 showing no ST deviation and negative for ischemia.  He was seen for follow-up on 02/20/2022.  He was able to upload his Apple watch tracing and he was having episodes of paroxysmal atrial fibrillation.  He had a CHA2DS2-VASc score of 1 and it was agreed to withhold anticoagulation and not pursue Watchman.  He was recently seen in the emergency department on 10/18/2023 for palpitations, chest discomfort, shortness of breath, fatigue, and lightheadedness.  He reported the night prior he was feeling palpitations and confirmed atrial fibrillation on his smart watch.  When he arrived into the emergency department he was still in atrial fibrillation and when he was getting his IV place he felt himself go back into a sinus rhythm.  His electrolytes were normal and he was discharged home in stable condition.   Discussed the use of AI scribe software for clinical note transcription with the patient, who gave verbal consent to  proceed.  History of Present Illness JACARRI GESNER is a 63 year old male with atrial fibrillation and a history of hemorrhagic stroke who presents with increased frequency of atrial fibrillation episodes.  He experiences atrial fibrillation episodes every 4 to 6 weeks, with recent episodes on October 15th-16th lasting 17 hours and on November 1st lasting 4 hours. He monitors his condition with an Apple Watch, which indicates episodes are now occurring more frequently, with the last two episodes being 2 weeks apart.  He takes propranolol  as needed, but it was ineffective during his last episode. Due to his history of hemorrhagic stroke, he is not on anticoagulation therapy.  During atrial fibrillation episodes, he experiences fatigue and a sensation of exertion, akin to 'running uphill'. His average heart rate outside of episodes is 63 beats per minute, ranging from 58 to 85 beats per minute. He feels symptomatic during episodes, with his Apple Watch alerting him to high heart rates.  He denies any chest pain, dyspnea, orthopnea, PND, syncope, presyncop   Review of systems:  Please see the history of present illness. All other systems are reviewed and otherwise negative.      Studies Reviewed:    EKG Interpretation Date/Time:  Friday November 09 2023 10:02:33 EST Ventricular Rate:  60 PR Interval:  190 QRS Duration:  94 QT Interval:  406 QTC Calculation: 406 R Axis:   -38  Text Interpretation: Normal sinus rhythm Left axis deviation Inferior infarct , age undetermined When compared with ECG of 18-Oct-2023 14:47, PREVIOUS ECG IS PRESENT Confirmed by Rana Lum [phone]) on 11/09/2023 12:22:08 PM    Exercise tolerance  test 07/14/2021   No ST deviation was noted. ECG was interpretable and conclusive. The ECG was negative for ischemia.   Prior study not available for comparison.  Echocardiogram 07/14/2021  1. Left ventricular ejection fraction, by estimation, is 60 to 65%. The  left  ventricle has normal function. The left ventricle has no regional  wall motion abnormalities. Left ventricular diastolic parameters are  indeterminate.   2. Right ventricular systolic function is normal. The right ventricular  size is normal. There is normal pulmonary artery systolic pressure.   3. The mitral valve is normal in structure. No evidence of mitral valve  regurgitation.   4. The aortic valve is normal in structure. Aortic valve regurgitation is  not visualized.   5. No significant aortic root or ascending aneurysm.   6. The inferior vena cava is normal in size with greater than 50%  respiratory variability, suggesting right atrial pressure of 3 mmHg.    Risk Assessment/Calculations:    CHA2DS2-VASc Score = 5   This indicates a 7.2% annual risk of stroke. The patient's score is based upon: CHF History: 0 HTN History: 1 Diabetes History: 1 Stroke History: 2 Vascular Disease History: 1 Age Score: 0 Gender Score: 0             Physical Exam:   VS:  BP 130/82 (BP Location: Left Arm, Patient Position: Sitting, Cuff Size: Normal)   Pulse (!) 59   Resp 16   Ht 5' 9 (1.753 m)   Wt 196 lb 3.2 oz (89 kg)   SpO2 99%   BMI 28.97 kg/m    Wt Readings from Last 3 Encounters:  11/09/23 196 lb 3.2 oz (89 kg)  10/18/23 195 lb (88.5 kg)  10/08/23 202 lb 12.8 oz (92 kg)    GEN: Well nourished, well developed in no acute distress NECK: No JVD; No carotid bruits CARDIAC: RRR, no murmurs, rubs, gallops RESPIRATORY:  Clear to auscultation without rales, wheezing or rhonchi  ABDOMEN: Soft, non-tender, non-distended EXTREMITIES:  No edema; No acute deformity      Assessment and Plan:  Paroxysmal atrial fibrillation Diagnosed with atrial fibrillation in 2024 Was not started on anticoagulation given history of cavernous angioma and was taking propranolol  as needed - See note 02/20/2022 With CHADS-Vasc of 1 we agree best to withhold anticoagulation and would not pursue  Watchman. LAAO/Watchman with DAPT could be revisited as his CHADS-Vasc increases with age and/or other comorbid conditions.  - Today he reports he typically experiences atrial fibrillation episodes every 4 to 6 weeks.  However recently he has had an increase in the frequency and duration of episodes with a recent episode occurring for 17 hours and another for 4 hours with the last 2 episodes occurring only 2 weeks apart - Today he does remain in sinus rhythm per EKG - He is symptomatic in atrial fibrillation usually with increased heart rates (>100 bpm) per his Apple Watch and experiences fatigue, dyspnea, and sensation of exertion - His CHA2DS2-VASc score is 5 given history of hypertension, T2DM, stroke, and extensive multivessel coronary artery calcification - He has history of right pons ICH secondary to cavernous hemangioma in 09/2016.  Will hold off on anticoagulation therapy  - I will refer to EP today to consider LAAO/Watchman - Start metoprolol XL 12.5 mg daily (home HR average upper 60s) - ED precautions given.  He understands his stroke risk  Coronary artery calcification ETT 07/2021 was negative for ischemia CTA chest 09/2021 showed extensive multivessel coronary  artery calcification - Today he is stable without chest pains.  No symptoms to suggest active angina.  No indication further ischemic evaluation at this time - Continue atorvastatin  40 mg daily   Hypertension Blood pressure today is well-controlled at 130/82 - Continue lisinopril -hydrochlorothiazide  20-25 mg daily  Hyperlipidemia LDL 55 on 11/2022 and well-controlled - Continue atorvastatin  40 mg daily  T2DM A1c 5.6% on 10/2023 - Managed on Mounjaro       Dispo: Follow-up with the EP.  Establish care with Dr. Kriste in 3 months.  Signed, Lum LITTIE Louis, NP

## 2023-11-09 NOTE — Patient Instructions (Addendum)
 Medication Instructions:  START Toprol (metoprolol succinate) 12.5mg  Take 1 tablet once a day (the tablet may come in 25mg  and if so you will need to break the tablet in half and take half a tablet once a day) *If you need a refill on your cardiac medications before your next appointment, please call your pharmacy*  Lab Work: None ordered If you have labs (blood work) drawn today and your tests are completely normal, you will receive your results only by: MyChart Message (if you have MyChart) OR A paper copy in the mail If you have any lab test that is abnormal or we need to change your treatment, we will call you to review the results.  Testing/Procedures: None ordered  Follow-Up: At [facility], you and your health needs are our priority.  As part of our continuing mission to provide you with exceptional heart care, our providers are all part of one team.  This team includes your primary Cardiologist (physician) and Advanced Practice Providers or APPs (Physician Assistants and Nurse Practitioners) who all work together to provide you with the care you need, when you need it.  Your next appointment:   3 month(s)  Provider:   Emeline Calender, MD    We recommend signing up for the patient portal called MyChart.  Sign up information is provided on this After Visit Summary.  MyChart is used to connect with patients for Virtual Visits (Telemedicine).  Patients are able to view lab/test results, encounter notes, upcoming appointments, etc.  Non-urgent messages can be sent to your provider as well.   To learn more about what you can do with MyChart, go to [email or web address].   Other Instructions You have been referred to Electrophysiologist (EP).

## 2023-11-16 DIAGNOSIS — L821 Other seborrheic keratosis: Secondary | ICD-10-CM | POA: Diagnosis not present

## 2023-11-16 DIAGNOSIS — D2239 Melanocytic nevi of other parts of face: Secondary | ICD-10-CM | POA: Diagnosis not present

## 2023-11-16 DIAGNOSIS — D225 Melanocytic nevi of trunk: Secondary | ICD-10-CM | POA: Diagnosis not present

## 2023-11-26 ENCOUNTER — Ambulatory Visit: Attending: Cardiovascular Disease | Admitting: Cardiovascular Disease

## 2023-11-26 ENCOUNTER — Encounter: Payer: Self-pay | Admitting: Cardiovascular Disease

## 2023-11-26 VITALS — BP 124/82 | HR 65 | Ht 69.0 in | Wt 194.8 lb

## 2023-11-26 DIAGNOSIS — I4891 Unspecified atrial fibrillation: Secondary | ICD-10-CM | POA: Diagnosis not present

## 2023-11-26 NOTE — Patient Instructions (Signed)
 Medication Instructions:  Your physician recommends that you continue on your current medications as directed. Please refer to the Current Medication list given to you today.  *If you need a refill on your cardiac medications before your next appointment, please call your pharmacy*  Lab Work: None ordered.  If you have labs (blood work) drawn today and your tests are completely normal, you will receive your results only by: MyChart Message (if you have MyChart) OR A paper copy in the mail If you have any lab test that is abnormal or we need to change your treatment, we will call you to review the results.  Testing/Procedures: None ordered.   Follow-Up: At [facility], you and your health needs are our priority.  As part of our continuing mission to provide you with exceptional heart care, our providers are all part of one team.  This team includes your primary Cardiologist (physician) and Advanced Practice Providers or APPs (Physician Assistants and Nurse Practitioners) who all work together to provide you with the care you need, when you need it.  Your next appointment:   Dr Nancey will contact you once we determine next steps.

## 2023-11-26 NOTE — Progress Notes (Signed)
 Dr. Rosemarie, you have never seen this patient. Prior patient of Dr. Ines. Hx of cavernous angioma with ICH. Can you weigh in on risk vs benefit of taking Eliquis with this history? Thank you!

## 2023-11-26 NOTE — Progress Notes (Signed)
 Electrophysiology Office Note:    Date:  11/26/2023   ID:  Todd Harris, DOB 11/07/1960, MRN [id number]  PCP:  Kennyth Worth HERO, MD   [facility] Providers Cardiologist:  Emeline FORBES Calender, MD Cardiology APP:  Rana Lum CROME, NP     Referring MD: Rana Lum CROME, NP   History of Present Illness:    Todd Harris is a 63 y.o. male with a medical history significant for atrial fibrillation, cavernous angioma referred for management of atrial fibrillation.      History of Present Illness He had a right pons intracranial hemorrhage secondary to a cavernous hemangioma in September 2018.  This was not his first hemorrhagic stroke.  He does have residual left-sided paresthesias and vertigo.  Atrial fibrillation is documented on 1 EKG from October 18, 2023.  He monitors his atrial fibrillation with an Apple watch.  On the day of his ER visit, he had been in A-fib for approximately 19 hours.  He converted to sinus before any intervention was performed in the ER.  Due to the history of recurrent intracranial hemorrhage, he was not placed on anticoagulation due to a CHA2DS2-VASc score of 1.         Today, he reports that he feels well and has no acute complaints.  EKGs/Labs/Other Studies Reviewed Today:     Echocardiogram:  TTE July 2023 LVEF 60 to 65%.  Normal RV function.  Normal valvular structure and function   Stress testing:  ETT July 2023 PT exercised for 5 minutes.  Maximal heart rate 137 bpm.  Test stopped due to vertigo.  No evidence of ischemia on EKG   EKG:   EKG Interpretation Date/Time:  Monday November 26 2023 14:19:33 EST Ventricular Rate:  65 PR Interval:  182 QRS Duration:  94 QT Interval:  390 QTC Calculation: 405 R Axis:   -38  Text Interpretation: Normal sinus rhythm Left axis deviation Minimal voltage criteria for LVH, may be normal variant ( R in aVL ) Possible Lateral infarct , age undetermined When compared with ECG of 09-Nov-2023  10:02, No significant change was found Confirmed by Nancey Scotts [phone]) on 11/26/2023 2:28:58 PM     Physical Exam:    VS:  BP 124/82 (BP Location: Left Arm, Patient Position: Sitting, Cuff Size: Large)   Pulse 65   Ht 5' 9 (1.753 m)   Wt 194 lb 12.8 oz (88.4 kg)   SpO2 95%   BMI 28.77 kg/m     Wt Readings from Last 3 Encounters:  11/26/23 194 lb 12.8 oz (88.4 kg)  11/09/23 196 lb 3.2 oz (89 kg)  10/18/23 195 lb (88.5 kg)     GEN: Well nourished, well developed in no acute distress CARDIAC: RRR, no murmurs, rubs, gallops RESPIRATORY:  Normal work of breathing MUSCULOSKELETAL: no edema    ASSESSMENT & PLAN:     Paroxysmal atrial fibrillation Mild symptoms Burden has been increasing, now sustaining for nearly a day at times necessitating ER visit I would strongly recommend rhythm control, particular because there is increased stroke risk with increased A-fib burden  Secondary hypercoagulable state Anticoagulation contraindicated by recurrent intracranial hemorrhage Would first plan to control this rhythm Will reach out to neurology to ascertain their thoughts on risk for anticoagulation  History of cerebral hemorrhage Will contact his neurology office, Dr. Darleen and NP Harlene Bogaert to get their thoughts on feasibility of anticoagulation  Coronary artery calcification No ischemia on stress test Would probably prefer Tikosyn  to flecainide  Type 2 diabetes On Mounjaro     Signed, Eulas FORBES Furbish, MD  11/26/2023 2:49 PM    [facility]

## 2023-12-02 ENCOUNTER — Other Ambulatory Visit: Payer: Self-pay | Admitting: Family Medicine

## 2023-12-04 ENCOUNTER — Telehealth: Payer: Self-pay

## 2023-12-04 NOTE — Progress Notes (Signed)
 Do you think he would be okay to be on [facility] for a few months to be able to proceed with watchman device as long as patient understands/agrees with potential risk?

## 2023-12-04 NOTE — Telephone Encounter (Addendum)
 Received Watchman referral from Dr. Nancey to Dr. Kennyth. Patient is not a good candidate for long-term anticoagulation due to multiple cavernous angiomas in the brain.  Given history of intracerebral hemorrhage he may be a candidate for Watchman.   Per Dr. Rosemarie, few months of [facility] will be fine but not long-term AC.  Spoke with patient and arranged consult 01/31/23 with Dr. Kennyth.

## 2023-12-05 NOTE — Progress Notes (Signed)
 Sounds good. Thank you for letting me know.

## 2023-12-17 DIAGNOSIS — G4733 Obstructive sleep apnea (adult) (pediatric): Secondary | ICD-10-CM | POA: Diagnosis not present

## 2023-12-17 NOTE — Progress Notes (Signed)
 CPAP order signed and sent to [facility]

## 2023-12-22 ENCOUNTER — Other Ambulatory Visit: Payer: Self-pay | Admitting: Family Medicine

## 2023-12-24 ENCOUNTER — Other Ambulatory Visit: Payer: Self-pay | Admitting: Family Medicine

## 2023-12-24 ENCOUNTER — Other Ambulatory Visit: Payer: Self-pay | Admitting: *Deleted

## 2023-12-24 ENCOUNTER — Encounter: Payer: Self-pay | Admitting: Family Medicine

## 2023-12-24 NOTE — Telephone Encounter (Signed)
 Rx send to pharmacy

## 2023-12-25 ENCOUNTER — Encounter: Payer: Self-pay | Admitting: Family Medicine

## 2023-12-25 ENCOUNTER — Other Ambulatory Visit: Payer: Self-pay | Admitting: *Deleted

## 2023-12-25 MED ORDER — MOUNJARO 5 MG/0.5ML ~~LOC~~ SOAJ: 5.0000 mg | SUBCUTANEOUS | 0 refills | Status: AC

## 2023-12-31 ENCOUNTER — Other Ambulatory Visit: Payer: Self-pay | Admitting: Family Medicine

## 2023-12-31 ENCOUNTER — Encounter: Payer: Self-pay | Admitting: Family Medicine

## 2024-01-02 ENCOUNTER — Other Ambulatory Visit: Payer: Self-pay | Admitting: Family Medicine

## 2024-01-02 DIAGNOSIS — I10 Essential (primary) hypertension: Secondary | ICD-10-CM

## 2024-01-31 ENCOUNTER — Ambulatory Visit: Admitting: Cardiology

## 2024-02-01 ENCOUNTER — Encounter: Payer: Self-pay | Admitting: Family Medicine

## 2024-02-01 ENCOUNTER — Telehealth: Payer: Self-pay

## 2024-02-01 ENCOUNTER — Encounter: Payer: Self-pay | Admitting: Cardiology

## 2024-02-01 ENCOUNTER — Ambulatory Visit: Attending: Cardiology | Admitting: Cardiology

## 2024-02-01 VITALS — BP 114/70 | HR 58 | Ht 69.0 in | Wt 193.0 lb

## 2024-02-01 DIAGNOSIS — I4891 Unspecified atrial fibrillation: Secondary | ICD-10-CM | POA: Diagnosis not present

## 2024-02-01 DIAGNOSIS — Z8673 Personal history of transient ischemic attack (TIA), and cerebral infarction without residual deficits: Secondary | ICD-10-CM | POA: Diagnosis not present

## 2024-02-01 DIAGNOSIS — D6869 Other thrombophilia: Secondary | ICD-10-CM

## 2024-02-01 DIAGNOSIS — I629 Nontraumatic intracranial hemorrhage, unspecified: Secondary | ICD-10-CM

## 2024-02-01 DIAGNOSIS — I480 Paroxysmal atrial fibrillation: Secondary | ICD-10-CM | POA: Diagnosis not present

## 2024-02-01 NOTE — Patient Instructions (Addendum)
 Medication Instructions:  Your physician recommends that you continue on your current medications as directed. Please refer to the Current Medication list given to you today.  *If you need a refill on your cardiac medications before your next appointment, please call your pharmacy*   Testing/Procedures: Cardiac CT Your physician has requested that you have cardiac CT. Cardiac computed tomography (CT) is a painless test that uses an x-ray machine to take clear, detailed pictures of your heart. For further information please visit [email or web address] Please follow instruction sheet as given. You will be called to schedule this test. Please see instruction letter given to you today.   Ablation  Your physician has recommended that you have an ablation. Catheter ablation is a medical procedure used to treat some cardiac arrhythmias (irregular heartbeats). During catheter ablation, a long, thin, flexible tube is put into a blood vessel in your groin (upper thigh), or neck. This tube is called an ablation catheter. It is then guided to your heart through the blood vessel. Radio frequency waves destroy small areas of heart tissue where abnormal heartbeats may cause an arrhythmia to start.  Watchman  Your physician has requested that you have Left atrial appendage (LAA) closure device implantation is a procedure to put a small device in the LAA of the heart. The LAA is a small sac in the wall of the heart's left upper chamber. Blood clots can form in this area. The device, Watchman closes the LAA to help prevent a blood clot and stroke.  You will be contacted by Nurse Navigator, Danielle to schedule your pre-procedure visit and procedure date. If you have any questions she can be reached at [phone].   Follow-Up: At [facility], you and your health needs are our priority.  As part of our continuing mission to provide you with exceptional heart care, our providers are all part of one team.   This team includes your primary Cardiologist (physician) and Advanced Practice Providers or APPs (Physician Assistants and Nurse Practitioners) who all work together to provide you with the care you need, when you need it.

## 2024-02-01 NOTE — Telephone Encounter (Signed)
 Per Dr. Kennyth, proceed with concomitant ablation/watchman. Patient needs CT scan prior.   Spoke with patient. Arranged CT scan for 2/20 at 9:30 AM. Arrival time 9 AM. Instructions will be sent via MyChart. BMET ordered. Instructions will be sent via MyChart.

## 2024-02-01 NOTE — Progress Notes (Unsigned)
" °  Electrophysiology Office Note:   Date:  02/01/2024  ID:  LEE KUANG, DOB [birth year], MRN [id number]  Primary Cardiologist: Emeline FORBES Calender, DO Electrophysiologist: Fonda Kitty, MD  {Click to update primary MD,subspecialty MD or APP then REFRESH:1}    History of Present Illness:   Todd Harris is a 64 y.o. male with h/o paroxysmal atrial fibrillation, hypercoagulable state due to AF, cavernous hemangiomas and history of intracerebral hemorrhage who is being seen today for AF management.  Discussed the use of AI scribe software for clinical note transcription with the patient, who gave verbal consent to proceed.  History of Present Illness     Review of systems complete and found to be negative unless listed in HPI.   EP Information / Studies Reviewed:    {EKGtoday:28818}     ECG 10/18/23: AF   Echo 07/14/21  1. Left ventricular ejection fraction, by estimation, is 60 to 65%. The  left ventricle has normal function. The left ventricle has no regional  wall motion abnormalities. Left ventricular diastolic parameters are  indeterminate.   2. Right ventricular systolic function is normal. The right ventricular  size is normal. There is normal pulmonary artery systolic pressure.   3. The mitral valve is normal in structure. No evidence of mitral valve  regurgitation.   4. The aortic valve is normal in structure. Aortic valve regurgitation is  not visualized.   5. No significant aortic root or ascending aneurysm.   6. The inferior vena cava is normal in size with greater than 50%  respiratory variability, suggesting right atrial pressure of 3 mmHg.    Risk Assessment/Calculations:    CHA2DS2-VASc Score = 5  {Confirm score is correct.  If not, click here to update score.  REFRESH note.  :1} This indicates a 7.2% annual risk of stroke. The patient's score is based upon: CHF History: 0 HTN History: 1 Diabetes History: 1 Stroke History: 2 Vascular Disease History: 1 Age  Score: 0 Gender Score: 0   {This patient has a significant risk of stroke if diagnosed with atrial fibrillation.  Please consider VKA or DOAC agent for anticoagulation if the bleeding risk is acceptable.   You can also use the SmartPhrase .[email or web address] for documentation.   :[id number]} No BP recorded.  {Refresh Note OR Click here to enter BP  :1}***        Physical Exam:   VS:  There were no vitals taken for this visit.   Wt Readings from Last 3 Encounters:  11/26/23 194 lb 12.8 oz (88.4 kg)  11/09/23 196 lb 3.2 oz (89 kg)  10/18/23 195 lb (88.5 kg)     GEN: Well nourished, well developed in no acute distress NECK: No JVD CARDIAC: {EPRHYTHM:28826}, no murmurs, rubs, gallops RESPIRATORY:  Clear to auscultation without rales, wheezing or rhonchi  ABDOMEN: Soft, non-distended EXTREMITIES:  No edema; No deformity   ASSESSMENT AND PLAN:   Assessment and Plan Assessment & Plan       Follow up with {EPMDS:28135::EP Team} {EPFOLLOW LE:71826}  Signed, Fonda Kitty, MD  "

## 2024-02-04 ENCOUNTER — Encounter: Payer: Self-pay | Admitting: Cardiology

## 2024-02-07 LAB — BASIC METABOLIC PANEL WITH GFR
BUN/Creatinine Ratio: 20 (ref 10–24)
BUN: 24 mg/dL (ref 8–27)
CO2: 25 mmol/L (ref 20–29)
Calcium: 10 mg/dL (ref 8.6–10.2)
Chloride: 98 mmol/L (ref 96–106)
Creatinine, Ser: 1.23 mg/dL (ref 0.76–1.27)
Glucose: 77 mg/dL (ref 70–99)
Potassium: 4.7 mmol/L (ref 3.5–5.2)
Sodium: 138 mmol/L (ref 134–144)
eGFR: 66 mL/min/{1.73_m2}

## 2024-02-08 ENCOUNTER — Ambulatory Visit: Admitting: Internal Medicine

## 2024-02-22 ENCOUNTER — Ambulatory Visit (HOSPITAL_COMMUNITY)

## 2024-03-20 ENCOUNTER — Ambulatory Visit: Admitting: Adult Health

## 2024-04-11 ENCOUNTER — Encounter: Admitting: Family Medicine

## 2024-08-20 ENCOUNTER — Ambulatory Visit: Admitting: Adult Health

## 2024-10-10 ENCOUNTER — Ambulatory Visit: Admitting: Behavioral Health
